# Patient Record
Sex: Male | Born: 1950
Health system: Southern US, Community
[De-identification: ages and names within clinical notes are randomized; demographics above are authoritative.]

## PROBLEM LIST (undated history)

## (undated) DIAGNOSIS — E785 Hyperlipidemia, unspecified: Secondary | ICD-10-CM

## (undated) DIAGNOSIS — R251 Tremor, unspecified: Secondary | ICD-10-CM

## (undated) DIAGNOSIS — D649 Anemia, unspecified: Secondary | ICD-10-CM

## (undated) DIAGNOSIS — K219 Gastro-esophageal reflux disease without esophagitis: Secondary | ICD-10-CM

## (undated) DIAGNOSIS — E119 Type 2 diabetes mellitus without complications: Secondary | ICD-10-CM

## (undated) DIAGNOSIS — M549 Dorsalgia, unspecified: Secondary | ICD-10-CM

## (undated) DIAGNOSIS — I10 Essential (primary) hypertension: Secondary | ICD-10-CM

## (undated) HISTORY — DX: Anemia, unspecified: D64.9

## (undated) HISTORY — PX: ELBOW BURSA SURGERY: SHX615

## (undated) HISTORY — DX: Hyperlipidemia, unspecified: E78.5

## (undated) HISTORY — PX: COLONOSCOPY: SHX174

## (undated) HISTORY — DX: Dorsalgia, unspecified: M54.9

## (undated) HISTORY — DX: Essential (primary) hypertension: I10

## (undated) HISTORY — DX: Type 2 diabetes mellitus without complications: E11.9

## (undated) HISTORY — DX: Tremor, unspecified: R25.1

## (undated) HISTORY — PX: HAND SURGERY: SHX662

## (undated) HISTORY — DX: Gastro-esophageal reflux disease without esophagitis: K21.9

---

## 2002-07-02 LAB — HM COLONOSCOPY

## 2008-06-01 ENCOUNTER — Ambulatory Visit: Payer: Self-pay | Admitting: Internal Medicine

## 2008-06-01 DIAGNOSIS — I10 Essential (primary) hypertension: Secondary | ICD-10-CM

## 2008-06-01 DIAGNOSIS — E785 Hyperlipidemia, unspecified: Secondary | ICD-10-CM

## 2008-06-01 DIAGNOSIS — K219 Gastro-esophageal reflux disease without esophagitis: Secondary | ICD-10-CM | POA: Insufficient documentation

## 2008-06-01 DIAGNOSIS — E1129 Type 2 diabetes mellitus with other diabetic kidney complication: Secondary | ICD-10-CM | POA: Insufficient documentation

## 2008-06-01 DIAGNOSIS — E119 Type 2 diabetes mellitus without complications: Secondary | ICD-10-CM

## 2008-06-01 HISTORY — DX: Hyperlipidemia, unspecified: E78.5

## 2008-06-01 HISTORY — DX: Type 2 diabetes mellitus without complications: E11.9

## 2008-06-01 HISTORY — DX: Gastro-esophageal reflux disease without esophagitis: K21.9

## 2008-06-01 HISTORY — DX: Essential (primary) hypertension: I10

## 2008-06-01 LAB — CONVERTED CEMR LAB
Blood Glucose, Fingerstick: 157
Hgb A1c MFr Bld: 6 % (ref 4.6–6.0)

## 2008-11-24 ENCOUNTER — Ambulatory Visit: Payer: Self-pay | Admitting: Internal Medicine

## 2008-11-24 LAB — CONVERTED CEMR LAB
ALT: 25 units/L (ref 0–53)
AST: 28 units/L (ref 0–37)
Albumin: 4.3 g/dL (ref 3.5–5.2)
Alkaline Phosphatase: 51 units/L (ref 39–117)
BUN: 18 mg/dL (ref 6–23)
Basophils Absolute: 0 10*3/uL (ref 0.0–0.1)
Basophils Relative: 0.2 % (ref 0.0–3.0)
Bilirubin Urine: NEGATIVE
Bilirubin, Direct: 0.1 mg/dL (ref 0.0–0.3)
Blood in Urine, dipstick: NEGATIVE
CO2: 29 meq/L (ref 19–32)
Calcium: 9.5 mg/dL (ref 8.4–10.5)
Chloride: 110 meq/L (ref 96–112)
Cholesterol: 149 mg/dL (ref 0–200)
Creatinine, Ser: 1.6 mg/dL — ABNORMAL HIGH (ref 0.4–1.5)
Creatinine,U: 254.3 mg/dL
Eosinophils Absolute: 0.2 10*3/uL (ref 0.0–0.7)
Eosinophils Relative: 4.1 % (ref 0.0–5.0)
GFR calc non Af Amer: 47.85 mL/min (ref >60)
Glucose, Bld: 108 mg/dL — ABNORMAL HIGH (ref 70–99)
Glucose, Urine, Semiquant: NEGATIVE
HCT: 37.1 % — ABNORMAL LOW (ref 39.0–52.0)
HDL: 41.6 mg/dL (ref >39.00)
Hemoglobin: 12.8 g/dL — ABNORMAL LOW (ref 13.0–17.0)
Hgb A1c MFr Bld: 6 % (ref 4.6–6.5)
LDL Cholesterol: 86 mg/dL (ref 0–99)
Lymphocytes Relative: 29 % (ref 12.0–46.0)
Lymphs Abs: 1.7 10*3/uL (ref 0.7–4.0)
MCHC: 34.6 g/dL (ref 30.0–36.0)
MCV: 101.9 fL — ABNORMAL HIGH (ref 78.0–100.0)
Microalb Creat Ratio: 14.2 mg/g (ref 0.0–30.0)
Microalb, Ur: 3.6 mg/dL — ABNORMAL HIGH (ref 0.0–1.9)
Monocytes Absolute: 0.5 10*3/uL (ref 0.1–1.0)
Monocytes Relative: 8.7 % (ref 3.0–12.0)
Neutro Abs: 3.5 10*3/uL (ref 1.4–7.7)
Neutrophils Relative %: 58 % (ref 43.0–77.0)
Nitrite: NEGATIVE
PSA: 0.73 ng/mL (ref 0.10–4.00)
Platelets: 199 10*3/uL (ref 150.0–400.0)
Potassium: 4.8 meq/L (ref 3.5–5.1)
RBC: 3.64 M/uL — ABNORMAL LOW (ref 4.22–5.81)
RDW: 11.8 % (ref 11.5–14.6)
Sodium: 142 meq/L (ref 135–145)
Specific Gravity, Urine: 1.02
TSH: 1.25 microintl units/mL (ref 0.35–5.50)
Total Bilirubin: 0.8 mg/dL (ref 0.3–1.2)
Total CHOL/HDL Ratio: 4
Total Protein: 7 g/dL (ref 6.0–8.3)
Triglycerides: 106 mg/dL (ref 0.0–149.0)
Urobilinogen, UA: 1
VLDL: 21.2 mg/dL (ref 0.0–40.0)
WBC Urine, dipstick: NEGATIVE
WBC: 5.9 10*3/uL (ref 4.5–10.5)
pH: 5.5

## 2008-12-06 ENCOUNTER — Ambulatory Visit: Payer: Self-pay | Admitting: Internal Medicine

## 2008-12-06 DIAGNOSIS — D649 Anemia, unspecified: Secondary | ICD-10-CM | POA: Insufficient documentation

## 2008-12-06 HISTORY — DX: Anemia, unspecified: D64.9

## 2009-06-09 ENCOUNTER — Ambulatory Visit: Payer: Self-pay | Admitting: Internal Medicine

## 2009-06-09 LAB — CONVERTED CEMR LAB
Blood Glucose, Fingerstick: 181
Hgb A1c MFr Bld: 5.5 % (ref 4.6–6.5)

## 2009-11-30 ENCOUNTER — Ambulatory Visit: Payer: Self-pay | Admitting: Internal Medicine

## 2009-11-30 LAB — CONVERTED CEMR LAB
ALT: 19 units/L (ref 0–53)
AST: 22 units/L (ref 0–37)
Albumin: 4.1 g/dL (ref 3.5–5.2)
Alkaline Phosphatase: 62 units/L (ref 39–117)
BUN: 19 mg/dL (ref 6–23)
Basophils Absolute: 0 10*3/uL (ref 0.0–0.1)
Basophils Relative: 0.6 % (ref 0.0–3.0)
Bilirubin Urine: NEGATIVE
Bilirubin, Direct: 0.1 mg/dL (ref 0.0–0.3)
Blood in Urine, dipstick: NEGATIVE
CO2: 26 meq/L (ref 19–32)
Calcium: 9.1 mg/dL (ref 8.4–10.5)
Chloride: 106 meq/L (ref 96–112)
Cholesterol: 155 mg/dL (ref 0–200)
Creatinine, Ser: 1.2 mg/dL (ref 0.4–1.5)
Creatinine,U: 183.2 mg/dL
Eosinophils Absolute: 0.3 10*3/uL (ref 0.0–0.7)
Eosinophils Relative: 4.4 % (ref 0.0–5.0)
GFR calc non Af Amer: 69.8 mL/min (ref >60)
Glucose, Bld: 89 mg/dL (ref 70–99)
Glucose, Urine, Semiquant: NEGATIVE
HCT: 39.1 % (ref 39.0–52.0)
HDL: 44.8 mg/dL (ref >39.00)
Hemoglobin: 13.6 g/dL (ref 13.0–17.0)
Hgb A1c MFr Bld: 5.8 % (ref 4.6–6.5)
Ketones, urine, test strip: NEGATIVE
LDL Cholesterol: 84 mg/dL (ref 0–99)
Lymphocytes Relative: 27.5 % (ref 12.0–46.0)
Lymphs Abs: 1.7 10*3/uL (ref 0.7–4.0)
MCHC: 34.9 g/dL (ref 30.0–36.0)
MCV: 102.6 fL — ABNORMAL HIGH (ref 78.0–100.0)
Microalb Creat Ratio: 1.5 mg/g (ref 0.0–30.0)
Microalb, Ur: 2.8 mg/dL — ABNORMAL HIGH (ref 0.0–1.9)
Monocytes Absolute: 0.5 10*3/uL (ref 0.1–1.0)
Monocytes Relative: 7.9 % (ref 3.0–12.0)
Neutro Abs: 3.7 10*3/uL (ref 1.4–7.7)
Neutrophils Relative %: 59.6 % (ref 43.0–77.0)
Nitrite: NEGATIVE
PSA: 1.29 ng/mL (ref 0.10–4.00)
Platelets: 208 10*3/uL (ref 150.0–400.0)
Potassium: 4.3 meq/L (ref 3.5–5.1)
RBC: 3.81 M/uL — ABNORMAL LOW (ref 4.22–5.81)
RDW: 12.4 % (ref 11.5–14.6)
Sodium: 141 meq/L (ref 135–145)
Specific Gravity, Urine: 1.025
TSH: 1.33 microintl units/mL (ref 0.35–5.50)
Total Bilirubin: 0.6 mg/dL (ref 0.3–1.2)
Total CHOL/HDL Ratio: 3
Total Protein: 7 g/dL (ref 6.0–8.3)
Triglycerides: 130 mg/dL (ref 0.0–149.0)
Urobilinogen, UA: 0.2
VLDL: 26 mg/dL (ref 0.0–40.0)
WBC Urine, dipstick: NEGATIVE
WBC: 6.3 10*3/uL (ref 4.5–10.5)
pH: 5.5

## 2009-12-12 ENCOUNTER — Ambulatory Visit: Payer: Self-pay | Admitting: Internal Medicine

## 2009-12-12 LAB — HM DIABETES EYE EXAM: HM Diabetic Eye Exam: NORMAL

## 2009-12-12 LAB — HM DIABETES FOOT EXAM

## 2009-12-12 LAB — CONVERTED CEMR LAB: Blood Glucose, Fingerstick: 123

## 2010-02-20 ENCOUNTER — Ambulatory Visit: Payer: Self-pay | Admitting: Internal Medicine

## 2010-02-20 DIAGNOSIS — M549 Dorsalgia, unspecified: Secondary | ICD-10-CM | POA: Insufficient documentation

## 2010-02-20 HISTORY — DX: Dorsalgia, unspecified: M54.9

## 2010-02-20 LAB — CONVERTED CEMR LAB: Blood Glucose, Fingerstick: 126

## 2010-04-11 ENCOUNTER — Ambulatory Visit: Payer: Self-pay | Admitting: Internal Medicine

## 2010-04-11 LAB — CONVERTED CEMR LAB
Blood Glucose, Fingerstick: 105
Hgb A1c MFr Bld: 5.5 % (ref 4.6–6.5)

## 2010-04-14 ENCOUNTER — Encounter: Admission: RE | Admit: 2010-04-14 | Discharge: 2010-04-14 | Payer: Self-pay | Admitting: Internal Medicine

## 2010-04-18 ENCOUNTER — Telehealth: Payer: Self-pay | Admitting: Internal Medicine

## 2010-04-19 ENCOUNTER — Telehealth: Payer: Self-pay

## 2010-04-20 ENCOUNTER — Encounter: Payer: Self-pay | Admitting: Internal Medicine

## 2010-06-15 ENCOUNTER — Ambulatory Visit: Payer: Self-pay | Admitting: Internal Medicine

## 2010-06-15 LAB — CONVERTED CEMR LAB: Blood Glucose, Fingerstick: 136

## 2010-08-01 NOTE — Assessment & Plan Note (Signed)
Summary: BACK PAIN//SLM   Vital Signs:  Richard [name] profile:   60 year old male Weight:      190 pounds Temp:     98.3 degrees F oral BP sitting:   110 / 68  (right arm) Cuff size:   regular  Vitals Entered By: Duard Brady LPN (April 11, 2010 8:10 AM) CC: c/o back pain getting worse , now neck bothering too ,tremors worse Is Richard [name] Diabetic? Yes Did you bring your meter with you today? No CBG Result 105   CC:  c/o back pain getting worse , now neck bothering too , and tremors worse.  History of Present Illness:  Richard Richard [name] who presents with a chief complaint of worsening low back pain.  This has been present for at least two months and he states that he is unable to work due to the severity of the pain.  Pain is aggravated by walking, with radiation to the hips and down both legs.  Denies any leg weakness.  He does have hypertension, type 2 diabetes, and dyslipidemia, which has been stable.  He is no history of radiographs of the back.  He also describes occasional orthostatic dizziness.  He is maintained nice glycemic control  Allergies (verified): No Known Drug Allergies  Past History:  Past Medical History: Reviewed history from 02/20/2010 and no changes required. Diabetes mellitus, type II GERD Hyperlipidemia Hypertension Anemia-NOS microalbuminuria low back pain  Past Surgical History: Reviewed history from 06/01/2008 and no changes required. colonoscopy 2004  Review of Systems       The Richard [name] complains of difficulty walking.  The Richard [name] denies anorexia, fever, weight loss, weight gain, vision loss, decreased hearing, hoarseness, chest pain, syncope, dyspnea on exertion, peripheral edema, prolonged cough, headaches, hemoptysis, abdominal pain, melena, hematochezia, severe indigestion/heartburn, hematuria, incontinence, genital sores, muscle weakness, suspicious skin lesions, transient blindness, depression, unusual weight change, abnormal bleeding,  enlarged lymph nodes, angioedema, breast masses, and testicular masses.    Physical Exam  General:  Well-developed,well-nourished,in no acute distress; alert,appropriate and cooperative throughout examination Head:  Normocephalic and atraumatic without obvious abnormalities. No apparent alopecia or balding. Mouth:  Oral mucosa and oropharynx without lesions or exudates.  Teeth in good repair. Neck:  No deformities, masses, or tenderness noted. Lungs:  Normal respiratory effort, chest expands symmetrically. Lungs are clear to auscultation, no crackles or wheezes. Heart:  Normal rate and regular rhythm. S1 and S2 normal without gallop, murmur, click, rub or other extra sounds. Abdomen:  Bowel sounds positive,abdomen soft and non-tender without masses, organomegaly or hernias noted. Msk:  diminished range of motion of both hips; straight leg testing negative Pulses:  R and L carotid,radial,femoral,dorsalis pedis and posterior tibial pulses are full and equal bilaterally Extremities:  No clubbing, cyanosis, edema, or deformity noted with normal full range of motion of all joints.     Impression & Recommendations:  Problem # 1:  BACK PAIN (ICD-724.5)  His updated medication list for this problem includes:    Cyclobenzaprine Hcl 10 Mg Tabs (Cyclobenzaprine hcl) ..... One tablet 3 times daily    Tramadol Hcl 50 Mg Tabs (Tramadol hcl) ..... One every 6 hours as needed for pain    His updated medication list for this problem includes:    Cyclobenzaprine Hcl 10 Mg Tabs (Cyclobenzaprine hcl) ..... One tablet 3 times daily    Tramadol Hcl 50 Mg Tabs (Tramadol hcl) ..... One every 6 hours as needed for pain  Orders: Radiology Referral (Radiology)  Problem # 2:  HYPERTENSION (ICD-401.9)  The following medications were removed from the medication list:    Lisinopril 40 Mg Tabs (Lisinopril) .Marland Kitchen... 1 once daily His updated medication list for this problem includes:    Lisinopril 20 Mg Tabs  (Lisinopril) ..... One daily  The following medications were removed from the medication list:    Lisinopril 40 Mg Tabs (Lisinopril) .Marland Kitchen... 1 once daily His updated medication list for this problem includes:    Lisinopril 20 Mg Tabs (Lisinopril) ..... One daily  Problem # 3:  DIABETES MELLITUS, TYPE II (ICD-250.00)  The following medications were removed from the medication list:    Lisinopril 40 Mg Tabs (Lisinopril) .Marland Kitchen... 1 once daily His updated medication list for this problem includes:    Metformin Hcl 500 Mg Xr24h-tab (Metformin hcl) .Marland KitchenMarland KitchenMarland KitchenMarland Kitchen 3 once daily    Lisinopril 20 Mg Tabs (Lisinopril) ..... One daily  Orders: Capillary Blood Glucose/CBG ([address]) Venipuncture ([address]) TLB-A1C / Hgb A1C (Glycohemoglobin) (83036-A1C) Specimen Handling (09811)  The following medications were removed from the medication list:    Lisinopril 40 Mg Tabs (Lisinopril) .Marland Kitchen... 1 once daily His updated medication list for this problem includes:    Metformin Hcl 500 Mg Xr24h-tab (Metformin hcl) .Marland KitchenMarland KitchenMarland KitchenMarland Kitchen 3 once daily    Lisinopril 20 Mg Tabs (Lisinopril) ..... One daily  Complete Medication List: 1)  [facility]vs Omeprazole 20 Mg Tbec (Omeprazole) .Marland Kitchen.. 1 once daily 2)  Metformin Hcl 500 Mg Xr24h-tab (Metformin hcl) .... 3 once daily 3)  Simvastatin 80 Mg Tabs (Simvastatin) .Marland Kitchen.. 1 once daily 4)  Freestyle Lancets Misc (Lancets) .... Use daily 5)  Accu-chek Aviva Strp (Glucose blood) .... Once daily and prn 6)  Cyclobenzaprine Hcl 10 Mg Tabs (Cyclobenzaprine hcl) .... One tablet 3 times daily 7)  Tramadol Hcl 50 Mg Tabs (Tramadol hcl) .... One every 6 hours as needed for pain 8)  Lisinopril 20 Mg Tabs (Lisinopril) .... One daily  Richard [name] Instructions: 1)  Please schedule a follow-up appointment in 3 months. 2)  Limit your Sodium (Salt). 3)  It is important that you exercise regularly at least 20 minutes 5 times a week. If you develop chest pain, have severe difficulty breathing, or feel very tired , stop exercising  immediately and seek medical attention. 4)  You need to lose weight. Consider a lower calorie diet and regular exercise.  5)  Check your blood sugars regularly. If your readings are usually above : or below Richard you should contact our office. 6)  It is important that your Diabetic A1c level is checked every 3 months. 7)  See your eye doctor yearly to check for diabetic eye damage.

## 2010-08-01 NOTE — Consult Note (Signed)
Summary: [facility]  [facility]   Imported By: Lanelle Bal 05/12/2010 08:18:08  _____________________________________________________________________  External Attachment:    Type:   Image     Comment:   External Document

## 2010-08-01 NOTE — Assessment & Plan Note (Signed)
Summary: back pain/njr   Vital Signs:  Patient profile:   60 year old male Weight:      198 pounds Temp:     98.0 degrees F BP sitting:   110 / 78  (left arm) Cuff size:   regular  Vitals Entered By: Kathrynn Speed CMA (February 20, 2010 4:39 PM) CC: back pain worse in the last 2 weeks , pain in hips when walkin, left pinky is numb, for one week, src  Is Patient Diabetic? Yes CBG Result 126   CC:  back pain worse in the last 2 weeks , pain in hips when walkin, left pinky is numb, for one week, and src .  History of Present Illness: 60 year old patient, who presents with a two to 3-week history of back pain.  He has had intermittent difficulties in the past.  He has been using ibuprofen as much as 7 tablets at a single dose.  He also describes an occasional tingling involving his left fifth finger area.  He states he also wakes the morning occasionally with some bilateral hand and arm numbness.  He has treated hypertension, dyslipidemia, and type 2 diabetes.  He states that the back pain is all aggravated by walking, with radiation to the hip and legs.   Current Medications (verified): 1)  [facility]vs Omeprazole 20 Mg Tbec (Omeprazole) .Marland Kitchen.. 1 Once Daily 2)  Metformin Hcl 500 Mg Xr24h-Tab (Metformin Hcl) .... 3 Once Daily 3)  Lisinopril 40 Mg Tabs (Lisinopril) .Marland Kitchen.. 1 Once Daily 4)  Simvastatin 80 Mg Tabs (Simvastatin) .Marland Kitchen.. 1 Once Daily 5)  Freestyle Lancets  Misc (Lancets) .... Use Daily 6)  Accu-Chek Aviva  Strp (Glucose Blood) .... Once Daily and Prn  Allergies (verified): No Known Drug Allergies  Past History:  Past Medical History: Diabetes mellitus, type II GERD Hyperlipidemia Hypertension Anemia-NOS microalbuminuria low back pain  Review of Systems  The patient denies anorexia, fever, weight loss, weight gain, vision loss, decreased hearing, hoarseness, chest pain, syncope, dyspnea on exertion, peripheral edema, prolonged cough, headaches, hemoptysis, abdominal pain, melena,  hematochezia, severe indigestion/heartburn, hematuria, incontinence, genital sores, muscle weakness, suspicious skin lesions, transient blindness, difficulty walking, depression, unusual weight change, abnormal bleeding, enlarged lymph nodes, angioedema, breast masses, and testicular masses.    Physical Exam  General:  Well-developed,well-nourished,in no acute distress; alert,appropriate and cooperative throughout examination Head:  Normocephalic and atraumatic without obvious abnormalities. No apparent alopecia or balding. Mouth:  Oral mucosa and oropharynx without lesions or exudates.  Teeth in good repair. Neck:  No deformities, masses, or tenderness noted. Lungs:  Normal respiratory effort, chest expands symmetrically. Lungs are clear to auscultation, no crackles or wheezes. Heart:  Normal rate and regular rhythm. S1 and S2 normal without gallop, murmur, click, rub or other extra sounds. Abdomen:  Bowel sounds positive,abdomen soft and non-tender without masses, organomegaly or hernias noted.   Impression & Recommendations:  Problem # 1:  BACK PAIN (ICD-724.5)  His updated medication list for this problem includes:    Cyclobenzaprine Hcl 10 Mg Tabs (Cyclobenzaprine hcl) ..... One tablet 3 times daily    Tramadol Hcl 50 Mg Tabs (Tramadol hcl) ..... One every 6 hours as needed for pain the patient will be treated with ibuprofen 800 mg t.i.d. with meals, and clinically observed.  He will be treated symptomatically.  If he fails to improve, will consider for imaging studies to rule out spinal stenosis and neurogenic claudication  Problem # 2:  HYPERTENSION (ICD-401.9)  His updated medication list for this  problem includes:    Lisinopril 40 Mg Tabs (Lisinopril) .Marland Kitchen... 1 once daily  Problem # 3:  DIABETES MELLITUS, TYPE II (ICD-250.00)  His updated medication list for this problem includes:    Metformin Hcl 500 Mg Xr24h-tab (Metformin hcl) .Marland KitchenMarland KitchenMarland KitchenMarland Kitchen 3 once daily    Lisinopril 40 Mg Tabs  (Lisinopril) .Marland Kitchen... 1 once daily  Orders: Capillary Blood Glucose/CBG (32440)  Complete Medication List: 1)  Cvs Omeprazole 20 Mg Tbec (Omeprazole) .Marland Kitchen.. 1 once daily 2)  Metformin Hcl 500 Mg Xr24h-tab (Metformin hcl) .... 3 once daily 3)  Lisinopril 40 Mg Tabs (Lisinopril) .Marland Kitchen.. 1 once daily 4)  Simvastatin 80 Mg Tabs (Simvastatin) .Marland Kitchen.. 1 once daily 5)  Freestyle Lancets Misc (Lancets) .... Use daily 6)  Accu-chek Aviva Strp (Glucose blood) .... Once daily and prn 7)  Cyclobenzaprine Hcl 10 Mg Tabs (Cyclobenzaprine hcl) .... One tablet 3 times daily 8)  Tramadol Hcl 50 Mg Tabs (Tramadol hcl) .... One every 6 hours as needed for pain  Patient Instructions: 1)  Most patients (90%) with low back pain will improve with time (2-6 weeks). Keep active but avoid activities that are painful. Apply moist heat and/or ice to lower back several times a day. 2)  Please schedule a follow-up appointment in 3 months. Prescriptions: TRAMADOL HCL 50 MG TABS (TRAMADOL HCL) one every 6 hours as needed for pain  #50 x 4   Entered and Authorized by:   Gordy Savers  MD   Signed by:   Gordy Savers  MD on 02/20/2010   Method used:   [facility]        [facility] [address] [phone]* (retail)       [address]       [place], [place]  [address]       Ph: [phone] or [phone]       Fax: [phone]   RxID:   [id number] CYCLOBENZAPRINE HCL 10 MG TABS (CYCLOBENZAPRINE HCL) one tablet 3 times daily  #30 x 4   Entered and Authorized by:   Gordy Savers  MD   Signed by:   Gordy Savers  MD on 02/20/2010   Method used:   [facility]        [facility] [address] [phone]* (retail)       [address]       [place], [place]  [address]       Ph: [phone] or [phone]       Fax: [phone]   RxID:   [id number]   Appended Document: back pain/njr     Vitals Entered By: Duard Brady LPN (February 21, 2010  7:59 AM)  Allergies: No Known Drug Allergies   Complete Medication List: 1)  [facility]vs Omeprazole 20 Mg Tbec (Omeprazole) .Marland Kitchen.. 1 once daily 2)  Metformin Hcl 500 Mg Xr24h-tab (Metformin hcl) .... 3 once daily 3)  Lisinopril 40 Mg Tabs (Lisinopril) .Marland Kitchen.. 1 once daily 4)  Simvastatin 80 Mg Tabs (Simvastatin) .Marland Kitchen.. 1 once daily 5)  Freestyle Lancets Misc (Lancets) .... Use daily 6)  Accu-chek Aviva Strp (Glucose blood) .... Once daily and prn 7)  Cyclobenzaprine Hcl 10 Mg Tabs (Cyclobenzaprine hcl) .... One tablet 3 times daily 8)  Tramadol Hcl 50 Mg Tabs (Tramadol hcl) .... One every 6 hours as needed for pain  Other Orders: Depo- Medrol 80mg  (J1040) Admin of Therapeutic Inj  intramuscular  or subcutaneous ([address])    Medication Administration  Injection # 1:    Medication: Depo- Medrol 80mg     Diagnosis: BACK PAIN (ICD-724.5)    Route: IM    Site: LUOQ gluteus    Exp Date: 09/2012    Lot #: [id number]    Mfr: Pharmacia    Patient tolerated injection without complications    Given by: Duard Brady LPN (February 21, 2010 8:00 AM)  Orders Added: 1)  Depo- Medrol 80mg  [J1040] 2)  Admin of Therapeutic Inj  intramuscular or subcutaneous [60454]

## 2010-08-01 NOTE — Miscellaneous (Signed)
Summary: rx different glucose test strips  Clinical Lists Changes  Medications: Removed medication of FREESTYLE LITE TEST  STRP (GLUCOSE BLOOD) use daily Added new medication of ACCU-CHEK AVIVA  STRP (GLUCOSE BLOOD) once daily and prn - Signed Rx of ACCU-CHEK AVIVA  STRP (GLUCOSE BLOOD) once daily and prn;  #30 days x 6;  Signed;  Entered by: Duard Brady LPN;  Authorized by: Gordy Savers  MD;  Method used: [facility] [address] [phone]*, [address], [place], [place], [place]  [address], Ph: [phone] or [phone], Fax: [phone]    Prescriptions: ACCU-CHEK AVIVA  STRP (GLUCOSE BLOOD) once daily and prn  #30 days x 6   Entered by:   Duard Brady LPN   Authorized by:   Gordy Savers  MD   Signed by:   Duard Brady LPN on 04/28/2535   Method used:   [facility]        [facility] [address] [phone]* (retail)       [address]       [place], [place]  [address]       Ph: [phone] or [phone]       Fax: [phone]   RxID:   [id number]  rec'd message from [facility] test strips not covered by ins. Ordered accucheck aviva test strips - called pt , if needs accuchek meter , come and get one. KIK

## 2010-08-01 NOTE — Progress Notes (Signed)
Summary: clarify prednisone sig  Phone Note Call from Patient Call back at [phone]   Caller: Patient--live call Summary of Call: pt was prescribed prednisone. he is requesting specific directions. Initial call taken by: Warnell Forester,  April 19, 2010 9:30 AM  Follow-up for Phone Call        spoke with pt - did not rec'v instructions how to take prednisone - did not recv 'pak'   i called pharmacy - some insurance does not pay for pak - have pt call and they will give instructions.   called pt to inform. KIk Follow-up by: Duard Brady LPN,  April 19, 2010 11:55 AM

## 2010-08-01 NOTE — Letter (Signed)
Summary: Out of Work  Adult nurse at [employer]  [address]   [place], [place] [address]   Phone: [phone]  Fax: [phone]    April 11, 2010   Employee:  MATHEWS STUHR [facility]    To Whom It May Concern:   For Medical reasons, please excuse the above named employee from work for the following dates:  Start:   04-11-10  End:   04-18-10  If you need additional information, please feel free to contact our office.         Sincerely,    Gordy Savers  MD

## 2010-08-01 NOTE — Progress Notes (Signed)
Summary: Pt called to get results from MRI. Pls call asap today  Phone Note Call from Patient Call back at [facility] Phone [phone] Call back at [phone] cell  (try cell # first, then call home#)   Caller: Patient Summary of Call: Pt called and is req results from MRI.Pt is not feeling any better and is suppose to be going back to work tomorrow and needs to know if he can or not. Pls call asap today.  Initial call taken by: Lucy Antigua,  April 18, 2010 11:10 AM    New/Updated Medications: PREDNISONE (PAK) 5 MG TABS (PREDNISONE) as directed Prescriptions: PREDNISONE (PAK) 5 MG TABS (PREDNISONE) as directed  #1 x 0   Entered and Authorized by:   Gordy Savers  MD   Signed by:   Gordy Savers  MD on 04/18/2010   Method used:   [facility]        [facility] [address] [phone]* (retail)       [address]       [place], [place]  [address]       Ph: [phone] or [phone]       Fax: [phone]   RxID:   [id number]

## 2010-08-01 NOTE — Assessment & Plan Note (Signed)
Summary: CPX // RS   Vital Signs:  Patient profile:   60 year old [name] Weight:      201 pounds Temp:     98.0 degrees F oral BP sitting:   130 / 80  (left arm) Cuff size:   regular  Vitals Entered By: Duard Brady LPN (December 12, 2009 1:14 PM) CC: cpx - doing well , BS running around 105 daily  Is Patient Diabetic? Yes Did you bring your meter with you today? No CBG Result 123   CC:  cpx - doing well  and BS running around 105 daily .  History of Present Illness: 60 year old patient who is seen today for a wellness exam.  Medical problems include type 2 diabetes dyslipidemia and hypertension.  He is doing quite well and denies any cardiopulmonary complaints and will A1c was normal.  He does monitor blood sugars at home with nice readings.  Preventive Screening-Counseling & Management  Alcohol-Tobacco     Smoking Status: current  Allergies (verified): No Known Drug Allergies  Past History:  Past Medical History: Reviewed history from 12/06/2008 and no changes required. Diabetes mellitus, type II GERD Hyperlipidemia Hypertension Anemia-NOS microalbuminuria  Past Surgical History: Reviewed history from 06/01/2008 and no changes required. colonoscopy 2004  Family History: Reviewed history from 06/01/2008 and no changes required. father died of suicide death at age 43 history of early dementia mother died age 3.  History of chronic artery disease, status post prior MI, history of chronic kidney disease, status post pacemaker insertion  One brother one sister positive for diabetes  Social History: Reviewed history from 12/06/2008 and no changes required. 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    Physical Exam  General:  Well-developed,well-nourished,in no acute distress; alert,appropriate and cooperative throughout examination Head:  Normocephalic and atraumatic without obvious abnormalities. No apparent alopecia or balding. Eyes:  No corneal or conjunctival inflammation noted. EOMI. Perrla. Funduscopic exam benign, without hemorrhages, exudates or papilledema. Vision grossly normal. Ears:  External ear exam shows no significant lesions or deformities.  Otoscopic examination reveals clear canals, tympanic membranes are intact bilaterally without bulging, retraction, inflammation or discharge. Hearing is grossly normal bilaterally. Nose:  External nasal examination shows no deformity or inflammation. Nasal mucosa are pink and moist without lesions or exudates. Mouth:  Oral mucosa and oropharynx without lesions or exudates.  Teeth in good repair. Neck:  No deformities, masses, or tenderness noted. Chest Wall:  No deformities, masses, tenderness or gynecomastia noted. Breasts:  No masses or gynecomastia noted Lungs:  Normal respiratory effort, chest expands symmetrically. Lungs are clear to auscultation, no crackles or wheezes. Heart:  Normal rate and regular rhythm. S1 and S2 normal without gallop, murmur, click, rub or other extra sounds. Abdomen:  Bowel sounds positive,abdomen soft and non-tender without masses, organomegaly or hernias noted.  liver edge may be palpable on inspiration Rectal:  No external abnormalities noted. Normal sphincter tone. No rectal masses or tenderness. Genitalia:  Testes bilaterally descended without  nodularity, tenderness or masses. No scrotal masses or lesions.  No penis lesions or urethral discharge. Prostate:  2+ enlarged.  2+ enlarged.   Msk:  No deformity or scoliosis noted of thoracic or lumbar spine.   Pulses:  R and L carotid,radial,femoral,dorsalis pedis and posterior tibial pulses are full and equal bilaterally Extremities:  No clubbing, cyanosis, edema, or deformity noted with normal full range of motion of all joints.   Neurologic:  No cranial nerve deficits noted. Station and gait are normal. Plantar reflexes are down-going bilaterally. DTRs are symmetrical throughout. Sensory, motor and coordinative functions appear intact. Skin:  Intact without suspicious lesions or rashes Cervical Nodes:  No lymphadenopathy noted Axillary Nodes:  No palpable lymphadenopathy Inguinal Nodes:  No significant adenopathy Psych:  Cognition and judgment appear intact. Alert and cooperative with normal attention span and concentration. No apparent delusions, illusions, hallucinations  Diabetes Management Exam:    Foot Exam (with socks and/or shoes not present):       Sensory-Pinprick/Light touch:          Left medial foot (L-4): normal          Left dorsal foot (L-5): normal          Left lateral foot (S-1): normal          Right medial foot (L-4): normal          Right dorsal foot (L-5): normal          Right lateral foot (S-1): normal       Sensory-Monofilament:          Left foot: normal          Right foot: normal       Inspection:          Left foot: normal          Right foot: normal       Nails:          Left foot: thickened          Right foot: thickened    Foot Exam by Podiatrist:       Date: 12/12/2009       Results: no diabetic findings       Done by: PCP    Eye Exam:       Eye Exam done here today          Results: normal   Impression & Recommendations:  Problem # 1:  PHYSICAL EXAMINATION (ICD-V70.0)  Complete Medication List: 1)  [facility] Omeprazole 20 Mg Tbec  (Omeprazole) .Marland Kitchen.. 1 once daily 2)  Metformin Hcl 500 Mg Xr24h-tab (Metformin hcl) .... 3 once daily 3)  Lisinopril 40 Mg Tabs (Lisinopril) .Marland Kitchen.. 1 once daily 4)  Simvastatin 80 Mg Tabs (Simvastatin) .Marland Kitchen.. 1 once daily 5)  Freestyle Lite Test Strp (Glucose blood) .... Use daily 6)  Freestyle Lancets Misc (Lancets) .... Use daily  Other Orders: Capillary Blood Glucose/CBG (13086)  Patient Instructions: 1)  Please schedule a follow-up appointment in 6 months. 2)  Limit your Sodium (Salt). 3)  It is important that you exercise regularly at least 20 minutes 5 times a week. If you develop chest pain, have severe difficulty breathing, or feel very tired , stop exercising immediately and seek medical attention. 4)  You need to lose weight. Consider a lower calorie diet and regular exercise.  5)  Check your blood sugars regularly. If your readings are usually above : or below 70 you should contact our office. 6)  It is important that your Diabetic A1c level is checked  every 3 months. 7)  See your eye doctor yearly to check for diabetic eye damage. Prescriptions: FREESTYLE LANCETS  MISC (LANCETS) use daily  #90 x 6   Entered and Authorized by:   Gordy Savers  MD   Signed by:   Gordy Savers  MD on 12/12/2009   Method used:   [facility]        [facility] [address] [phone]* (retail)       [address]       [place], [place]  [address]       Ph: [phone] or [phone]       Fax: [phone]   RxID:   [id number] SIMVASTATIN 80 MG TABS (SIMVASTATIN) 1 once daily  #90 Tablet x 6   Entered and Authorized by:   Gordy Savers  MD   Signed by:   Gordy Savers  MD on 12/12/2009   Method used:   [facility]        [facility] [address] [phone]* (retail)       [address]       [place], [place]  [address]       Ph: [phone] or [phone]       Fax: [phone]   RxID:   [id number] FREESTYLE LITE  TEST  STRP (GLUCOSE BLOOD) use daily  #90 x 6   Entered and Authorized by:   Gordy Savers  MD   Signed by:   Gordy Savers  MD on 12/12/2009   Method used:   [facility]        [facility] [address] [phone]* (retail)       [address]       [place], [place]  [address]       Ph: [phone] or [phone]       Fax: [phone]   RxID:   [id number] LISINOPRIL 40 MG TABS (LISINOPRIL) 1 once daily  #90 Tablet x 6   Entered and Authorized by:   Gordy Savers  MD   Signed by:   Gordy Savers  MD on 12/12/2009   Method used:   [facility]        [facility] [address] [phone]* (retail)       [address]       [place], [place]  [address]       Ph: [phone] or [phone]       Fax: [phone]   RxID:   [id number] METFORMIN HCL 500 MG XR24H-TAB (METFORMIN HCL) 3 once daily  #270 Tablet x 6   Entered and Authorized by:   Gordy Savers  MD   Signed by:   Gordy Savers  MD on 12/12/2009   Method used:   [facility]        [facility] [address] [phone]* (retail)       [address]       [place], [place]  [address]       Ph: [phone] or [phone]       Fax: [phone]   RxID:   [id number] [facility] OMEPRAZOLE 20 MG TBEC (OMEPRAZOLE) 1 once daily  #90 Capsule x 6   Entered and Authorized by:  Gordy Savers  MD   Signed by:   Gordy Savers  MD on 12/12/2009   Method used:   [facility] to        [facility] [address] [phone]* (retail)       [address]       [place], [place]  [address]       Ph: [phone] or [phone]       Fax: [phone]   RxID:   [id number]

## 2010-08-03 NOTE — Assessment & Plan Note (Signed)
Summary: 6 month follow up/cjr   Vital Signs:  Patient profile:   60 year old male Weight:      198 pounds Temp:     98.8 degrees F oral BP sitting:   130 / 80  (right arm) Cuff size:   regular  Vitals Entered By: Duard Brady LPN (June 15, 2010 3:47 PM) CC: 6 mos rov - nasal congestion  Is Patient Diabetic? Yes Did you bring your meter with you today? No CBG Result 136   CC:  6 mos rov - nasal congestion .  History of Present Illness: 60 year old patient who is seen today for follow-up of his type 2 diabetes.  He has hypertension, gastroesophageal reflux disease, and the osteoarthritis.  He has done quite well.  His last hemoglobin A1c was 5.5.  He remains on metformin therapy alone.  He is on simvastatin 80 mg daily, which he continues to tolerate well.  He has gastroesophageal reflux disease, which has been stable  Allergies (verified): No Known Drug Allergies  Past History:  Past Medical History: Reviewed history from 02/20/2010 and no changes required. Diabetes mellitus, type II GERD Hyperlipidemia Hypertension Anemia-NOS microalbuminuria low back pain  Past Surgical History: Reviewed history from 06/01/2008 and no changes required. colonoscopy 2004  Family History: Reviewed history from 06/01/2008 and no changes required. father died of suicide death at age 89 history of early dementia mother died age 41.  History of chronic artery disease, status post prior MI, history of chronic kidney disease, status post pacemaker insertion  One brother one sister positive for diabetes  Review of Systems  The patient denies anorexia, fever, weight loss, weight gain, vision loss, decreased hearing, hoarseness, chest pain, syncope, dyspnea on exertion, peripheral edema, prolonged cough, headaches, hemoptysis, abdominal pain, melena, hematochezia, severe indigestion/heartburn, hematuria, incontinence, genital sores, muscle weakness, suspicious skin lesions,  transient blindness, difficulty walking, depression, unusual weight change, abnormal bleeding, enlarged lymph nodes, angioedema, breast masses, and testicular masses.    Physical Exam  General:  overweight-appearing.  130/80 Head:  Normocephalic and atraumatic without obvious abnormalities. No apparent alopecia or balding. Eyes:  No corneal or conjunctival inflammation noted. EOMI. Perrla. Funduscopic exam benign, without hemorrhages, exudates or papilledema. Vision grossly normal. Mouth:  Oral mucosa and oropharynx without lesions or exudates.  Teeth in good repair. Neck:  No deformities, masses, or tenderness noted. Lungs:  Normal respiratory effort, chest expands symmetrically. Lungs are clear to auscultation, no crackles or wheezes. Heart:  Normal rate and regular rhythm. S1 and S2 normal without gallop, murmur, click, rub or other extra sounds. Abdomen:  Bowel sounds positive,abdomen soft and non-tender without masses, organomegaly or hernias noted. Msk:  No deformity or scoliosis noted of thoracic or lumbar spine.   Pulses:  R and L carotid,radial,femoral,dorsalis pedis and posterior tibial pulses are full and equal bilaterally Extremities:  No clubbing, cyanosis, edema, or deformity noted with normal full range of motion of all joints.   Skin:  Intact without suspicious lesions or rashes Cervical Nodes:  No lymphadenopathy noted Axillary Nodes:  No palpable lymphadenopathy   Impression & Recommendations:  Problem # 1:  BACK PAIN (ICD-724.5)  His updated medication list for this problem includes:    Cyclobenzaprine Hcl 10 Mg Tabs (Cyclobenzaprine hcl) ..... One tablet 3 times daily    Tramadol Hcl 50 Mg Tabs (Tramadol hcl) ..... One every 6 hours as needed for pain  Problem # 2:  HYPERTENSION (ICD-401.9)  His updated medication list for this problem includes:  Lisinopril 20 Mg Tabs (Lisinopril) ..... One daily  Problem # 3:  HYPERLIPIDEMIA (ICD-272.4)  His updated  medication list for this problem includes:    Simvastatin 80 Mg Tabs (Simvastatin) .Marland Kitchen... 1 once daily  Problem # 4:  DIABETES MELLITUS, TYPE II (ICD-250.00)  His updated medication list for this problem includes:    Metformin Hcl 500 Mg Xr24h-tab (Metformin hcl) .Marland KitchenMarland KitchenMarland KitchenMarland Kitchen 3 once daily    Lisinopril 20 Mg Tabs (Lisinopril) ..... One daily  Orders: Capillary Blood Glucose/CBG (24401)  Complete Medication List: 1)  [facility] Omeprazole 20 Mg Tbec (Omeprazole) .Marland Kitchen.. 1 once daily 2)  Metformin Hcl 500 Mg Xr24h-tab (Metformin hcl) .... 3 once daily 3)  Simvastatin 80 Mg Tabs (Simvastatin) .Marland Kitchen.. 1 once daily 4)  Accu-chek Aviva Strp (Glucose blood) .... Once daily and prn 5)  Cyclobenzaprine Hcl 10 Mg Tabs (Cyclobenzaprine hcl) .... One tablet 3 times daily 6)  Tramadol Hcl 50 Mg Tabs (Tramadol hcl) .... One every 6 hours as needed for pain 7)  Lisinopril 20 Mg Tabs (Lisinopril) .... One daily 8)  Prednisone (pak) 5 Mg Tabs (Prednisone) .... As directed 9)  Accu-chek Aviva Kit (Blood glucose monitoring suppl) 10)  Accu-chek Multiclix Lancets Misc (Lancets) .... Once daily and prn  Patient Instructions: 1)  Please schedule a follow-up appointment in 6 months for annual exam 2)  Limit your Sodium (Salt) to less than 2 grams a day(slightly less than 1/2 a teaspoon) to prevent fluid retention, swelling, or worsening of symptoms. 3)  It is important that you exercise regularly at least 20 minutes 5 times a week. If you develop chest pain, have severe difficulty breathing, or feel very tired , stop exercising immediately and seek medical attention. 4)  You need to lose weight. Consider a lower calorie diet and regular exercise.  5)  Check your blood sugars regularly. If your readings are usually above : or below 70 you should contact our office. 6)  It is important that your Diabetic A1c level is checked every 3 months. 7)  See your eye doctor yearly to check for diabetic eye  damage. Prescriptions: SIMVASTATIN 80 MG TABS (SIMVASTATIN) 1 once daily  #90 Tablet x 6   Entered and Authorized by:   Gordy Savers  MD   Signed by:   Gordy Savers  MD on 06/15/2010   Method used:   [facility] to        [facility] [address] [phone]* (retail)       [address]       [place], [place]  [address]       Ph: [phone] or [phone]       Fax: [phone]   RxID:   [id number] METFORMIN HCL 500 MG XR24H-TAB (METFORMIN HCL) 3 once daily  #270 Tablet x 6   Entered and Authorized by:   Gordy Savers  MD   Signed by:   Gordy Savers  MD on 06/15/2010   Method used:   [facility]ron[facility] to        [facility] [address] [phone]* (retail)       [address]       [place], [place]  [address]       Ph: [phone] or [phone]       Fax: [phone]   RxID:   [id number] [facility] OMEPRAZOLE 20 MG TBEC (OMEPRAZOLE) 1 once daily  #90  Capsule x 6   Entered and Authorized by:   Gordy Savers  MD   Signed by:   Gordy Savers  MD on 06/15/2010   Method used:   Electronically to        [facility] [address] [phone]* (retail)       [address]       [place], [place]  [address]       Ph: [phone] or [phone]       Fax: [phone]   RxID:   [id number] ACCU-CHEK AVIVA  STRP (GLUCOSE BLOOD) once daily and prn  #100 x 3   Entered by:   Duard Brady LPN   Authorized by:   Gordy Savers  MD   Signed by:   Duard Brady LPN on 34/74/2595   Method used:   [facility] to        [facility] [address] [phone]* (retail)       [address]       [place], [place]  [address]       Ph: [phone] or [phone]       Fax: [phone]   RxID:   [id number] ACCU-CHEK MULTICLIX LANCETS  MISC (LANCETS) once daily and prn  #100 x 3   Entered by:   Duard Brady LPN   Authorized by:   Gordy Savers  MD   Signed  by:   Duard Brady LPN on 17/61/6073   Method used:   [facility]ronically to        [facility] [address] [phone]* (retail)       [address]       [place], [place]  [address]       Ph: [phone] or [phone]       Fax: [phone]   RxID:   [id number]    Orders Added: 1)  Capillary Blood Glucose/CBG [82948] 2)  Est. Patient Level IV [23536]

## 2010-12-14 ENCOUNTER — Other Ambulatory Visit: Payer: Self-pay

## 2010-12-21 ENCOUNTER — Encounter: Payer: Self-pay | Admitting: Internal Medicine

## 2010-12-22 ENCOUNTER — Other Ambulatory Visit (INDEPENDENT_AMBULATORY_CARE_PROVIDER_SITE_OTHER): Payer: 59

## 2010-12-22 DIAGNOSIS — Z0000 Encounter for general adult medical examination without abnormal findings: Secondary | ICD-10-CM

## 2010-12-22 DIAGNOSIS — T887XXA Unspecified adverse effect of drug or medicament, initial encounter: Secondary | ICD-10-CM

## 2010-12-22 DIAGNOSIS — E785 Hyperlipidemia, unspecified: Secondary | ICD-10-CM

## 2010-12-22 LAB — HEPATIC FUNCTION PANEL
ALT: 15 U/L (ref 0–53)
AST: 17 U/L (ref 0–37)
Albumin: 4.3 g/dL (ref 3.5–5.2)
Alkaline Phosphatase: 79 U/L (ref 39–117)
Bilirubin, Direct: 0.1 mg/dL (ref 0.0–0.3)
Total Protein: 6.8 g/dL (ref 6.0–8.3)

## 2010-12-22 LAB — POCT URINALYSIS DIPSTICK
Glucose, UA: NEGATIVE
Leukocytes, UA: NEGATIVE
Nitrite, UA: NEGATIVE
Spec Grav, UA: 1.03
Urobilinogen, UA: 1
pH, UA: 5.5

## 2010-12-22 LAB — HEMOGLOBIN A1C: Hgb A1c MFr Bld: 5.6 % (ref 4.6–6.5)

## 2010-12-22 LAB — CBC WITH DIFFERENTIAL/PLATELET
Basophils Absolute: 0 10*3/uL (ref 0.0–0.1)
Basophils Relative: 0.6 % (ref 0.0–3.0)
Eosinophils Absolute: 0.5 10*3/uL (ref 0.0–0.7)
Eosinophils Relative: 6.5 % — ABNORMAL HIGH (ref 0.0–5.0)
HCT: 38.8 % — ABNORMAL LOW (ref 39.0–52.0)
Hemoglobin: 13.2 g/dL (ref 13.0–17.0)
Lymphocytes Relative: 22 % (ref 12.0–46.0)
Lymphs Abs: 1.6 10*3/uL (ref 0.7–4.0)
MCHC: 34 g/dL (ref 30.0–36.0)
MCV: 107.8 fl — ABNORMAL HIGH (ref 78.0–100.0)
Monocytes Absolute: 0.6 10*3/uL (ref 0.1–1.0)
Neutro Abs: 4.6 10*3/uL (ref 1.4–7.7)
Neutrophils Relative %: 62.2 % (ref 43.0–77.0)
RDW: 13.2 % (ref 11.5–14.6)
WBC: 7.3 10*3/uL (ref 4.5–10.5)

## 2010-12-22 LAB — LIPID PANEL
Cholesterol: 158 mg/dL (ref 0–200)
HDL: 39.2 mg/dL (ref >39.00)
LDL Cholesterol: 95 mg/dL (ref 0–99)
Total CHOL/HDL Ratio: 4
Triglycerides: 118 mg/dL (ref 0.0–149.0)
VLDL: 23.6 mg/dL (ref 0.0–40.0)

## 2010-12-22 LAB — BASIC METABOLIC PANEL
BUN: 17 mg/dL (ref 6–23)
CO2: 26 mEq/L (ref 19–32)
Calcium: 9.8 mg/dL (ref 8.4–10.5)
Chloride: 107 mEq/L (ref 96–112)
Creatinine, Ser: 1.4 mg/dL (ref 0.4–1.5)
Glucose, Bld: 90 mg/dL (ref 70–99)
Sodium: 140 mEq/L (ref 135–145)

## 2010-12-22 LAB — MICROALBUMIN / CREATININE URINE RATIO
Creatinine,U: 397.6 mg/dL
Microalb Creat Ratio: 1.3 mg/g (ref 0.0–30.0)
Microalb, Ur: 5.2 mg/dL — ABNORMAL HIGH (ref 0.0–1.9)

## 2010-12-22 LAB — PSA: PSA: 1.19 ng/mL (ref 0.10–4.00)

## 2010-12-28 ENCOUNTER — Encounter: Payer: Self-pay | Admitting: Internal Medicine

## 2010-12-29 ENCOUNTER — Ambulatory Visit (INDEPENDENT_AMBULATORY_CARE_PROVIDER_SITE_OTHER): Payer: 59 | Admitting: Internal Medicine

## 2010-12-29 ENCOUNTER — Encounter: Payer: Self-pay | Admitting: Internal Medicine

## 2010-12-29 DIAGNOSIS — E119 Type 2 diabetes mellitus without complications: Secondary | ICD-10-CM

## 2010-12-29 DIAGNOSIS — I10 Essential (primary) hypertension: Secondary | ICD-10-CM

## 2010-12-29 DIAGNOSIS — E785 Hyperlipidemia, unspecified: Secondary | ICD-10-CM

## 2010-12-29 DIAGNOSIS — M25519 Pain in unspecified shoulder: Secondary | ICD-10-CM

## 2010-12-29 DIAGNOSIS — Z0000 Encounter for general adult medical examination without abnormal findings: Secondary | ICD-10-CM

## 2010-12-29 MED ORDER — TRAMADOL HCL 50 MG PO TABS: 50.0000 mg | ORAL_TABLET | Freq: Four times a day (QID) | ORAL | Status: DC | PRN

## 2010-12-29 MED ORDER — SIMVASTATIN 80 MG PO TABS: 80.0000 mg | ORAL_TABLET | Freq: Every day | ORAL | Status: DC

## 2010-12-29 MED ORDER — OMEPRAZOLE 20 MG PO CPDR: 20.0000 mg | DELAYED_RELEASE_CAPSULE | Freq: Every day | ORAL | Status: DC

## 2010-12-29 MED ORDER — METFORMIN HCL 500 MG PO TABS: ORAL_TABLET | ORAL | Status: DC

## 2010-12-29 MED ORDER — LISINOPRIL 20 MG PO TABS: 20.0000 mg | ORAL_TABLET | Freq: Every day | ORAL | Status: DC

## 2010-12-29 NOTE — Patient Instructions (Signed)
Cardiac stress test as discussed  Limit your sodium (Salt) intake  Return in 3 months for follow-up

## 2010-12-29 NOTE — Progress Notes (Signed)
Subjective:    Patient ID: Richard [name], male    DOB: January 16, 1951, 60 y.o.   MRN: [id number]  HPI  60 year old patient who is in today for a wellness exam he has multiple cardiac risk factors including hypertension dyslipidemia and type 2 diabetes. He has maintained nice glycemic control he continues to smoke 2 or 3 cigars nightly but discontinued cigarette use about 15 years ago. For the past 2 months he has had exertional neck and shoulder pain.  Preventive Screening-Counseling & Management  Alcohol-Tobacco  Smoking Status: current   Allergies (verified):  No Known Drug Allergies   Past History:  Past Medical History:  Reviewed history from 12/06/2008 and no changes required.  Diabetes mellitus, type II  GERD  Hyperlipidemia  Hypertension  Anemia-NOS  microalbuminuria   Past Surgical History:  Reviewed history from 06/01/2008 and no changes required.  colonoscopy 2004   Family History:  Reviewed history from 06/01/2008 and no changes required.  father died of suicide death at age 15 history of early dementia  mother died age 61. History of chronic artery disease, status post prior MI, history of chronic kidney disease, status post pacemaker insertion  One brother one sister positive for diabetes - sister with advanced senile dementia of the Alzheimer's type  Social History:  Reviewed history from 12/06/2008 and no changes required.  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  HENT: Negative for hearing loss, ear pain, congestion, rhinorrhea, sneezing, mouth sores, trouble swallowing, neck pain, neck stiffness, dental problem, voice change, sinus pressure and tinnitus.   Eyes: Negative for photophobia, pain, redness and visual disturbance.  Respiratory:  Negative for apnea, cough, choking, chest tightness, shortness of breath and wheezing.   Cardiovascular: Negative for chest pain, palpitations and leg swelling.       Exertional neck and shoulder pain may be angina equivalent  Gastrointestinal: Negative for nausea, vomiting, abdominal pain, diarrhea, constipation, blood in stool, abdominal distention, anal bleeding and rectal pain.  Genitourinary: Negative for dysuria, urgency, frequency, hematuria, flank pain, decreased urine volume, discharge, penile swelling, scrotal swelling, difficulty urinating, genital sores and testicular pain.  Musculoskeletal: Negative for myalgias, back pain, joint swelling, arthralgias and gait problem.  Skin: Negative for color change, rash and wound.  Neurological: Negative for dizziness, tremors, seizures, syncope, facial asymmetry, speech difficulty, weakness, light-headedness, numbness and headaches.  Hematological: Negative for adenopathy. Does not bruise/bleed easily.  Psychiatric/Behavioral: Negative for suicidal ideas, hallucinations, behavioral problems, confusion, sleep disturbance, self-injury, dysphoric mood, decreased concentration and agitation. The patient is not nervous/anxious.        Objective:   Physical Exam  Constitutional: He appears well-developed and well-nourished.  HENT:  Head: Normocephalic and atraumatic.  Right Ear: External ear normal.  Left Ear: External ear normal.  Nose: Nose normal.  Mouth/Throat: Oropharynx is clear and moist.  Eyes: Conjunctivae and EOM are normal. Pupils are equal, round, and reactive to light. No scleral icterus.  Neck: Normal range of motion. Neck supple. No JVD present. No thyromegaly present.  Cardiovascular: Regular rhythm, normal heart sounds and intact distal pulses.  Exam reveals no gallop and no friction rub.   No murmur heard. Pulmonary/Chest: Effort normal and breath sounds normal. He exhibits no tenderness.  Abdominal: Soft. Bowel sounds are  normal. He exhibits no  distension and no mass. There is no tenderness.  Genitourinary: Prostate normal and penis normal.  Musculoskeletal: Normal range of motion. He exhibits no edema and no tenderness.  Lymphadenopathy:    He has no cervical adenopathy.  Neurological: He is alert. He has normal reflexes. No cranial nerve deficit. Coordination normal.  Skin: Skin is warm and dry. No rash noted.  Psychiatric: He has a normal mood and affect. His behavior is normal.          Assessment & Plan:    Annual health examination. Patient has exertional neck and shoulder discomfort. He has multiple cardiovascular risk factors we'll review a 12-lead EKG and set up for a nuclear stress test

## 2011-04-23 LAB — HM DIABETES EYE EXAM

## 2011-04-24 ENCOUNTER — Encounter: Payer: Self-pay | Admitting: Internal Medicine

## 2011-07-02 ENCOUNTER — Other Ambulatory Visit: Payer: Self-pay | Admitting: Internal Medicine

## 2011-07-04 ENCOUNTER — Other Ambulatory Visit: Payer: Self-pay | Admitting: Internal Medicine

## 2011-07-04 MED ORDER — SIMVASTATIN 80 MG PO TABS: 80.0000 mg | ORAL_TABLET | Freq: Every day | ORAL | Status: DC

## 2011-07-04 NOTE — Telephone Encounter (Signed)
[facility] cpx is in June.

## 2011-08-20 ENCOUNTER — Ambulatory Visit (INDEPENDENT_AMBULATORY_CARE_PROVIDER_SITE_OTHER): Payer: 59 | Admitting: Internal Medicine

## 2011-08-20 ENCOUNTER — Other Ambulatory Visit: Payer: Self-pay | Admitting: Internal Medicine

## 2011-08-20 ENCOUNTER — Encounter: Payer: Self-pay | Admitting: Internal Medicine

## 2011-08-20 DIAGNOSIS — E119 Type 2 diabetes mellitus without complications: Secondary | ICD-10-CM

## 2011-08-20 DIAGNOSIS — I10 Essential (primary) hypertension: Secondary | ICD-10-CM

## 2011-08-20 LAB — GLUCOSE, POCT (MANUAL RESULT ENTRY): POC Glucose: 123

## 2011-08-20 NOTE — Patient Instructions (Signed)
Annual exam as scheduled in June  Call or return to clinic prn if these symptoms worsen or fail to improve as anticipated.

## 2011-08-20 NOTE — Progress Notes (Signed)
  Subjective:    Patient ID: Richard [name], male    DOB: 1950-11-24, 61 y.o.   MRN: [id number]  HPI  61 year old patient who has a history of type 2 diabetes controlled on metformin therapy alone. Her today he had an episode of dizziness associated with the nausea diaphoresis and weakness. He was concerned about the uncontrolled diabetes. His home glucometer was not functional. At the present time he feels well random blood sugar today 123 hemoglobin A1c's have been consistently less than 6 on metformin therapy he states that he has had similar episodes related to inner ear issues really over the years    Review of Systems  Constitutional: Negative for fever, chills, appetite change and fatigue.  HENT: Negative for hearing loss, ear pain, congestion, sore throat, trouble swallowing, neck stiffness, dental problem, voice change and tinnitus.   Eyes: Negative for pain, discharge and visual disturbance.  Respiratory: Negative for cough, chest tightness, wheezing and stridor.   Cardiovascular: Negative for chest pain, palpitations and leg swelling.  Gastrointestinal: Positive for nausea. Negative for vomiting, abdominal pain, diarrhea, constipation, blood in stool and abdominal distention.  Genitourinary: Negative for urgency, hematuria, flank pain, discharge, difficulty urinating and genital sores.  Musculoskeletal: Negative for myalgias, back pain, joint swelling, arthralgias and gait problem.  Skin: Negative for rash.  Neurological: Positive for weakness and light-headedness. Negative for dizziness, syncope, speech difficulty, numbness and headaches.  Hematological: Negative for adenopathy. Does not bruise/bleed easily.  Psychiatric/Behavioral: Negative for behavioral problems and dysphoric mood. The patient is not nervous/anxious.        Objective:   Physical Exam  Constitutional: He is oriented to person, place, and time. He appears well-developed.       Repeat blood pressure 130/90    HENT:  Head: Normocephalic.  Right Ear: External ear normal.  Left Ear: External ear normal.  Eyes: Conjunctivae and EOM are normal.  Neck: Normal range of motion.  Cardiovascular: Normal rate and normal heart sounds.   Pulmonary/Chest: Breath sounds normal.  Abdominal: Bowel sounds are normal.  Musculoskeletal: Normal range of motion. He exhibits no edema and no tenderness.  Neurological: He is alert and oriented to person, place, and time.  Psychiatric: He has a normal mood and affect. His behavior is normal.          Assessment & Plan:    Doubt hypoglycemic episode suspect more of a vertiginous episode Hypertension Diabetes mellitus.  Patient was given a new glucometer he'll check his blood sugars as needed. He is scheduled for a physical in 4 months we'll reassess at that time

## 2011-09-03 ENCOUNTER — Other Ambulatory Visit: Payer: Self-pay | Admitting: Internal Medicine

## 2011-11-14 ENCOUNTER — Other Ambulatory Visit: Payer: Self-pay | Admitting: Internal Medicine

## 2011-12-24 ENCOUNTER — Other Ambulatory Visit (INDEPENDENT_AMBULATORY_CARE_PROVIDER_SITE_OTHER): Payer: 59

## 2011-12-24 DIAGNOSIS — Z0000 Encounter for general adult medical examination without abnormal findings: Secondary | ICD-10-CM

## 2011-12-24 LAB — MICROALBUMIN / CREATININE URINE RATIO
Creatinine,U: 181.3 mg/dL
Microalb Creat Ratio: 4.1 mg/g (ref 0.0–30.0)

## 2011-12-24 LAB — HEPATIC FUNCTION PANEL
ALT: 13 U/L (ref 0–53)
Bilirubin, Direct: 0.2 mg/dL (ref 0.0–0.3)
Total Protein: 7.4 g/dL (ref 6.0–8.3)

## 2011-12-24 LAB — PSA: PSA: 1.11 ng/mL (ref 0.10–4.00)

## 2011-12-24 LAB — BASIC METABOLIC PANEL
Chloride: 103 mEq/L (ref 96–112)
Potassium: 5.4 mEq/L — ABNORMAL HIGH (ref 3.5–5.1)

## 2011-12-24 LAB — POCT URINALYSIS DIPSTICK
Ketones, UA: NEGATIVE
pH, UA: 5.5

## 2011-12-24 LAB — CBC WITH DIFFERENTIAL/PLATELET
Basophils Relative: 0.7 % (ref 0.0–3.0)
Eosinophils Relative: 4.1 % (ref 0.0–5.0)
HCT: 48.1 % (ref 39.0–52.0)
Lymphs Abs: 2.2 10*3/uL (ref 0.7–4.0)
MCV: 106.9 fl — ABNORMAL HIGH (ref 78.0–100.0)
Monocytes Absolute: 0.5 10*3/uL (ref 0.1–1.0)
RBC: 4.5 Mil/uL (ref 4.22–5.81)
WBC: 5.7 10*3/uL (ref 4.5–10.5)

## 2011-12-24 LAB — LIPID PANEL
Cholesterol: 125 mg/dL (ref 0–200)
LDL Cholesterol: 61 mg/dL (ref 0–99)

## 2011-12-24 LAB — TSH: TSH: 0.97 u[IU]/mL (ref 0.35–5.50)

## 2011-12-31 ENCOUNTER — Ambulatory Visit (INDEPENDENT_AMBULATORY_CARE_PROVIDER_SITE_OTHER): Payer: 59 | Admitting: Internal Medicine

## 2011-12-31 ENCOUNTER — Encounter: Payer: Self-pay | Admitting: Internal Medicine

## 2011-12-31 VITALS — BP 140/90 | HR 92 | Temp 98.3°F | Resp 20 | Ht 67.5 in | Wt 181.0 lb

## 2011-12-31 DIAGNOSIS — Z0000 Encounter for general adult medical examination without abnormal findings: Secondary | ICD-10-CM

## 2011-12-31 DIAGNOSIS — F17200 Nicotine dependence, unspecified, uncomplicated: Secondary | ICD-10-CM

## 2011-12-31 DIAGNOSIS — D7589 Other specified diseases of blood and blood-forming organs: Secondary | ICD-10-CM | POA: Insufficient documentation

## 2011-12-31 DIAGNOSIS — Z720 Tobacco use: Secondary | ICD-10-CM

## 2011-12-31 MED ORDER — LISINOPRIL 20 MG PO TABS: 20.0000 mg | ORAL_TABLET | Freq: Every day | ORAL | Status: DC

## 2011-12-31 MED ORDER — OMEPRAZOLE 20 MG PO CPDR: 20.0000 mg | DELAYED_RELEASE_CAPSULE | Freq: Every day | ORAL | Status: DC

## 2011-12-31 MED ORDER — SIMVASTATIN 40 MG PO TABS: ORAL_TABLET | ORAL | Status: DC

## 2011-12-31 MED ORDER — METFORMIN HCL 500 MG PO TABS: ORAL_TABLET | ORAL | Status: DC

## 2011-12-31 MED ORDER — TRAMADOL HCL 50 MG PO TABS: 50.0000 mg | ORAL_TABLET | Freq: Three times a day (TID) | ORAL | Status: DC | PRN

## 2011-12-31 MED ORDER — TERBINAFINE HCL 250 MG PO TABS: 250.0000 mg | ORAL_TABLET | Freq: Every day | ORAL | Status: DC

## 2011-12-31 NOTE — Patient Instructions (Signed)
Limit your sodium (Salt) intake  Smoking tobacco is very bad for your health. You should stop smoking immediately.  Return in 6 months for follow-up   

## 2011-12-31 NOTE — Progress Notes (Signed)
Subjective:    Patient ID: Richard [name], male    DOB: [birth year], 61 y.o.   MRN: [id number]  Diabetes Pertinent negatives for hypoglycemia include no confusion, dizziness, headaches, nervousness/anxiousness, seizures, speech difficulty or tremors. Pertinent negatives for diabetes include no chest pain, no fatigue and no weakness.  Hypertension Pertinent negatives include no chest pain, headaches, neck pain, palpitations or shortness of breath.  Hyperlipidemia Pertinent negatives include no chest pain, myalgias or shortness of breath.  61 year-old patient who is in today for a wellness exam;  he has multiple cardiac risk factors including hypertension dyslipidemia and type 2 diabetes. He has maintained nice glycemic control he continues to smoke approximately one pack per day. At the time his last exam he was smoking cigars only for now has resumed smoking cigarettes. He has had no further exertional shoulder and neck pain but continues to have some and shoulder discomfort with movement   Preventive Screening-Counseling & Management  Alcohol-Tobacco  Smoking Status: current   Allergies (verified):  No Known Drug Allergies   Past History:  Past Medical History:  Reviewed history from 12/06/2008 and no changes required.  Diabetes mellitus, type II  GERD  Hyperlipidemia  Hypertension  Anemia-NOS  microalbuminuria  Macrocytosis Tobacco abuse  Past Surgical History:  Reviewed history from 06/01/2008 and no changes required.  colonoscopy 2004   Family History:  Reviewed history from 06/01/2008 and no changes required.   father died of suicide death at age 8 history of early dementia  mother died age 37. History of chronic artery disease, status post prior MI, history of chronic kidney disease, status post pacemaker insertion  One brother one sister positive for diabetes - sister with advanced senile dementia of the Alzheimer's type  Social History:  Reviewed history from  12/06/2008 and no changes required.  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  HENT: Negative for hearing loss, ear pain, congestion, rhinorrhea, sneezing, mouth sores, trouble swallowing, neck pain, neck stiffness, dental problem, voice change, sinus pressure and tinnitus.   Eyes: Negative for photophobia, pain, redness and visual disturbance.  Respiratory: Negative for apnea, cough, choking, chest tightness, shortness of breath and wheezing.   Cardiovascular: Negative for chest pain, palpitations and leg swelling.  Gastrointestinal: Negative for nausea, vomiting, abdominal pain, diarrhea, constipation, blood in stool, abdominal distention, anal bleeding and rectal pain.  Genitourinary: Negative for dysuria, urgency, frequency, hematuria, flank pain, decreased urine volume, discharge, penile swelling, scrotal swelling, difficulty urinating, genital sores and testicular pain.  Musculoskeletal: Positive for arthralgias. Negative for myalgias, back pain, joint swelling and gait problem.       Bilateral shoulder and some pain  Skin: Negative for color change, rash and wound.  Neurological: Negative for dizziness, tremors, seizures, syncope, facial asymmetry, speech difficulty, weakness, light-headedness, numbness and headaches.  Hematological: Negative for adenopathy. Does not bruise/bleed easily.  Psychiatric/Behavioral: Negative for suicidal ideas, hallucinations, behavioral problems, confusion, disturbed wake/sleep cycle, self-injury, dysphoric mood, decreased concentration and agitation. The patient is not nervous/anxious.        Objective:   Physical Exam  Constitutional: He appears well-developed and well-nourished.  HENT:  Head: Normocephalic and atraumatic.  Right Ear:  External ear normal.  Left Ear: External ear normal.  Nose: Nose normal.  Mouth/Throat: Oropharynx is clear and  moist.  Eyes: Conjunctivae and EOM are normal. Pupils are equal, round, and reactive to light. No scleral icterus.  Neck: Normal range of motion. Neck supple. No JVD present. No thyromegaly present.  Cardiovascular: Regular rhythm, normal heart sounds and intact distal pulses.  Exam reveals no gallop and no friction rub.   No murmur heard.      Dorsalis pedis pulses faint. Posterior tibial pulses full  Pulmonary/Chest: Effort normal and breath sounds normal. He exhibits no tenderness.  Abdominal: Soft. Bowel sounds are normal. He exhibits no distension and no mass. There is no tenderness.  Genitourinary: Prostate normal and penis normal.  Musculoskeletal: Normal range of motion. He exhibits no edema and no tenderness.  Lymphadenopathy:    He has no cervical adenopathy.  Neurological: He is alert. He has normal reflexes. No cranial nerve deficit. Coordination normal.  Skin: Skin is warm and dry. No rash noted.       Onychomycotic nail changes  Psychiatric: He has a normal mood and affect. His behavior is normal.          Assessment & Plan:    Annual health examination.   dyslipidemia. Total cholesterol 125. We'll decrease simvastatin to 40 mg daily Diabetes stable  Hypertension stable  Tobacco abuse. Total smoking cessation encouraged  Toenail onychomycosis. We'll treat with Lamisil for 12 weeks

## 2012-01-28 ENCOUNTER — Other Ambulatory Visit: Payer: Self-pay | Admitting: Internal Medicine

## 2012-03-20 ENCOUNTER — Encounter: Payer: Self-pay | Admitting: Gastroenterology

## 2012-08-05 ENCOUNTER — Other Ambulatory Visit: Payer: Self-pay | Admitting: Internal Medicine

## 2012-09-09 ENCOUNTER — Encounter: Payer: Self-pay | Admitting: Internal Medicine

## 2012-09-09 ENCOUNTER — Ambulatory Visit (INDEPENDENT_AMBULATORY_CARE_PROVIDER_SITE_OTHER): Payer: 59 | Admitting: Internal Medicine

## 2012-09-09 VITALS — BP 140/80 | HR 84 | Temp 98.1°F | Resp 20 | Wt 186.0 lb

## 2012-09-09 DIAGNOSIS — I10 Essential (primary) hypertension: Secondary | ICD-10-CM

## 2012-09-09 DIAGNOSIS — E119 Type 2 diabetes mellitus without complications: Secondary | ICD-10-CM

## 2012-09-09 DIAGNOSIS — F17200 Nicotine dependence, unspecified, uncomplicated: Secondary | ICD-10-CM

## 2012-09-09 DIAGNOSIS — Z720 Tobacco use: Secondary | ICD-10-CM

## 2012-09-09 MED ORDER — PREDNISONE 20 MG PO TABS: 20.0000 mg | ORAL_TABLET | Freq: Two times a day (BID) | ORAL | Status: DC

## 2012-09-09 MED ORDER — FLUTICASONE PROPIONATE 50 MCG/ACT NA SUSP: 2.0000 | Freq: Every day | NASAL | Status: DC

## 2012-09-09 NOTE — Progress Notes (Signed)
Subjective:    Patient ID: Richard [name], male    DOB: July 03, 1950, 62 y.o.   MRN: [id number]  HPI  62 year old patient with a history of ongoing tobacco use. He presents with a 4 to five-day history of sinus congestion malaise and a general sense of unwellness. No fever. He has type 2 diabetes which has maintained excellent control. He is scheduled for followup exam and physical later this summer. No purulent drainage or focal sinus pain. No dental pain  Past Medical History  Diagnosis Date  . ANEMIA-NOS 12/06/2008  . BACK PAIN 02/20/2010  . DIABETES MELLITUS, TYPE II 06/01/2008  . GERD 06/01/2008  . HYPERLIPIDEMIA 06/01/2008  . HYPERTENSION 06/01/2008    History   Social History  . Marital Status: Married    Spouse Name: N/A    Number of Children: N/A  . Years of Education: N/A   Occupational History  . Not on file.   Social History Main Topics  . Smoking status: Current Every Day Smoker    Types: Cigarettes  . Smokeless tobacco: Never Used  . Alcohol Use: Yes  . Drug Use: No  . Sexually Active: Not on file   Other Topics Concern  . Not on file   Social History Narrative  . No narrative on file    History reviewed. No pertinent past surgical history.  Family History  Problem Relation Age of Onset  . Diabetes Sister   . Diabetes Brother     No Known Allergies  Current Outpatient Prescriptions on File Prior to Visit  Medication Sig Dispense Refill  . ACCU-CHEK AVIVA test strip USE ONCE DAILY AND AS NEEDED  100 each  3  . cyclobenzaprine (FLEXERIL) 10 MG tablet Take 10 mg by mouth 3 (three) times daily as needed.        . Lancets (ACCU-CHEK MULTICLIX) lancets 1 each by Other route daily. Use as instructed       . lisinopril (PRINIVIL,ZESTRIL) 20 MG tablet Take 1 tablet (20 mg total) by mouth daily.  90 tablet  6  . metFORMIN (GLUCOPHAGE) 500 MG tablet TAKE 2 TABLETS IN AM AND 1 TABLET IN PM  270 tablet  5  . omeprazole (PRILOSEC) 20 MG capsule Take 1 capsule (20 mg  total) by mouth daily.  90 capsule  6  . simvastatin (ZOCOR) 40 MG tablet TAKE 1 TABLET EVERY DAY  90 tablet  1  . terbinafine (LAMISIL) 250 MG tablet Take 1 tablet (250 mg total) by mouth daily.  90 tablet  0  . traMADol (ULTRAM) 50 MG tablet Take 1 tablet (50 mg total) by mouth every 8 (eight) hours as needed for pain.  90 tablet  4   No current facility-administered medications on file prior to visit.    BP 140/80  Pulse 84  Temp(Src) 98.1 F (36.7 C) (Oral)  Resp 20  Wt 186 lb (84.369 kg)  BMI 28.68 kg/m2  SpO2 98%      Review of Systems  Constitutional: Positive for fatigue. Negative for fever, chills and appetite change.  HENT: Positive for congestion, rhinorrhea and sinus pressure. Negative for hearing loss, ear pain, sore throat, trouble swallowing, neck stiffness, dental problem, voice change and tinnitus.   Eyes: Negative for pain, discharge and visual disturbance.  Respiratory: Negative for cough, chest tightness, wheezing and stridor.   Cardiovascular: Negative for chest pain, palpitations and leg swelling.  Gastrointestinal: Negative for nausea, vomiting, abdominal pain, diarrhea, constipation, blood in stool and abdominal distention.  Genitourinary: Negative for urgency, hematuria, flank pain, discharge, difficulty urinating and genital sores.  Musculoskeletal: Negative for myalgias, back pain, joint swelling, arthralgias and gait problem.  Skin: Negative for rash.  Neurological: Negative for dizziness, syncope, speech difficulty, weakness, numbness and headaches.  Hematological: Negative for adenopathy. Does not bruise/bleed easily.  Psychiatric/Behavioral: Negative for behavioral problems and dysphoric mood. The patient is not nervous/anxious.        Objective:   Physical Exam  Constitutional: He is oriented to person, place, and time. He appears well-developed.  HENT:  Head: Normocephalic.  Right Ear: External ear normal.  Left Ear: External ear normal.   Eyes: Conjunctivae and EOM are normal.  Neck: Normal range of motion.  Cardiovascular: Normal rate and normal heart sounds.   Pulmonary/Chest: Breath sounds normal.  Abdominal: Bowel sounds are normal.  Musculoskeletal: Normal range of motion. He exhibits no edema and no tenderness.  Neurological: He is alert and oriented to person, place, and time.  Psychiatric: He has a normal mood and affect. His behavior is normal.          Assessment & Plan:   URI/Viral sinusitis  Symptomatic treatment

## 2012-09-09 NOTE — Patient Instructions (Signed)
Smoking tobacco is very bad for your health. You should stop smoking immediately.  Acute sinusitis symptoms for less than 10 days are generally not helped by antibiotic therapy.  Use saline irrigation, warm  moist compresses and over-the-counter decongestants only as directed.  Call if there is no improvement in 5 to 7 days, or sooner if you develop increasing pain, fever, or any new symptoms.

## 2012-09-23 ENCOUNTER — Ambulatory Visit (INDEPENDENT_AMBULATORY_CARE_PROVIDER_SITE_OTHER): Payer: 59 | Admitting: Internal Medicine

## 2012-09-23 ENCOUNTER — Encounter: Payer: Self-pay | Admitting: Internal Medicine

## 2012-09-23 VITALS — BP 160/90 | HR 98 | Temp 98.8°F | Resp 18 | Wt 187.0 lb

## 2012-09-23 DIAGNOSIS — F17200 Nicotine dependence, unspecified, uncomplicated: Secondary | ICD-10-CM

## 2012-09-23 DIAGNOSIS — Z720 Tobacco use: Secondary | ICD-10-CM

## 2012-09-23 MED ORDER — AMOXICILLIN-POT CLAVULANATE 875-125 MG PO TABS: 1.0000 | ORAL_TABLET | Freq: Two times a day (BID) | ORAL | Status: DC

## 2012-09-23 NOTE — Progress Notes (Signed)
Subjective:    Patient ID: Richard [name], male    DOB: 12-Apr-1951, 62 y.o.   MRN: [id number]  HPI  62 year old patient who was seen 2 weeks ago with a URI symptoms. He was treated symptomatically at that time. More recently has developed worsening sinus pain congestion and drainage. He feels unwell. He feels that he has had intermittent low-grade fever. He continues to smoke. Chronic medical problems include diabetes hypertension and dyslipidemia.  Past Medical History  Diagnosis Date  . ANEMIA-NOS 12/06/2008  . BACK PAIN 02/20/2010  . DIABETES MELLITUS, TYPE II 06/01/2008  . GERD 06/01/2008  . HYPERLIPIDEMIA 06/01/2008  . HYPERTENSION 06/01/2008    History   Social History  . Marital Status: Married    Spouse Name: N/A    Number of Children: N/A  . Years of Education: N/A   Occupational History  . Not on file.   Social History Main Topics  . Smoking status: Current Every Day Smoker    Types: Cigarettes  . Smokeless tobacco: Never Used  . Alcohol Use: Yes  . Drug Use: No  . Sexually Active: Not on file   Other Topics Concern  . Not on file   Social History Narrative  . No narrative on file    History reviewed. No pertinent past surgical history.  Family History  Problem Relation Age of Onset  . Diabetes Sister   . Diabetes Brother     No Known Allergies  Current Outpatient Prescriptions on File Prior to Visit  Medication Sig Dispense Refill  . ACCU-CHEK AVIVA test strip USE ONCE DAILY AND AS NEEDED  100 each  3  . cyclobenzaprine (FLEXERIL) 10 MG tablet Take 10 mg by mouth 3 (three) times daily as needed.        . fluticasone (FLONASE) 50 MCG/ACT nasal spray Place 2 sprays into the nose daily.  16 g  6  . Lancets (ACCU-CHEK MULTICLIX) lancets 1 each by Other route daily. Use as instructed       . lisinopril (PRINIVIL,ZESTRIL) 20 MG tablet Take 1 tablet (20 mg total) by mouth daily.  90 tablet  6  . metFORMIN (GLUCOPHAGE) 500 MG tablet TAKE 2 TABLETS IN AM AND 1  TABLET IN PM  270 tablet  5  . omeprazole (PRILOSEC) 20 MG capsule Take 1 capsule (20 mg total) by mouth daily.  90 capsule  6  . simvastatin (ZOCOR) 40 MG tablet TAKE 1 TABLET EVERY DAY  90 tablet  1  . traMADol (ULTRAM) 50 MG tablet Take 1 tablet (50 mg total) by mouth every 8 (eight) hours as needed for pain.  90 tablet  4   No current facility-administered medications on file prior to visit.    BP 160/90  Pulse 98  Temp(Src) 98.8 F (37.1 C) (Oral)  Resp 18  Wt 187 lb (84.823 kg)  BMI 28.84 kg/m2  SpO2 98%       Review of Systems  Constitutional: Positive for fever and fatigue. Negative for chills and appetite change.  HENT: Positive for congestion and postnasal drip. Negative for hearing loss, ear pain, sore throat, trouble swallowing, neck stiffness, dental problem, voice change and tinnitus.   Eyes: Negative for pain, discharge and visual disturbance.  Respiratory: Negative for cough, chest tightness, wheezing and stridor.   Cardiovascular: Negative for chest pain, palpitations and leg swelling.  Gastrointestinal: Negative for nausea, vomiting, abdominal pain, diarrhea, constipation, blood in stool and abdominal distention.  Genitourinary: Negative for urgency, hematuria, flank  pain, discharge, difficulty urinating and genital sores.  Musculoskeletal: Negative for myalgias, back pain, joint swelling, arthralgias and gait problem.  Skin: Negative for rash.  Neurological: Positive for headaches. Negative for dizziness, syncope, speech difficulty, weakness and numbness.  Hematological: Negative for adenopathy. Does not bruise/bleed easily.  Psychiatric/Behavioral: Negative for behavioral problems and dysphoric mood. The patient is not nervous/anxious.        Objective:   Physical Exam  Constitutional: He is oriented to person, place, and time. He appears well-developed.  HENT:  Head: Normocephalic.  Right Ear: External ear normal.  Left Ear: External ear normal.   Sinus tenderness to palpation  Eyes: Conjunctivae and EOM are normal.  Neck: Normal range of motion.  Cardiovascular: Normal rate and normal heart sounds.   Pulmonary/Chest: Breath sounds normal.  Abdominal: Bowel sounds are normal.  Musculoskeletal: Normal range of motion. He exhibits no edema and no tenderness.  Neurological: He is alert and oriented to person, place, and time.  Psychiatric: He has a normal mood and affect. His behavior is normal.          Assessment & Plan:   Sinusitis. Will treat with Augmentin. We'll continue expectorants decongestants nasal irrigation. Cessation of all tobacco encouraged Hypertension stable diabetes mellitus

## 2012-09-23 NOTE — Patient Instructions (Signed)
Use saline irrigation, warm  moist compresses and over-the-counter decongestants only as directed.  Call if there is no improvement in 5 to 7 days, or sooner if you develop increasing pain, fever, or any new symptoms. Take your antibiotic as prescribed until ALL of it is gone, but stop if you develop a rash, swelling, or any side effects of the medication.  Contact our office as soon as possible if  there are side effects of the medication.  Smoking tobacco is very bad for your health. You should stop smoking immediately.

## 2012-09-30 ENCOUNTER — Encounter: Payer: Self-pay | Admitting: Internal Medicine

## 2012-09-30 ENCOUNTER — Ambulatory Visit (INDEPENDENT_AMBULATORY_CARE_PROVIDER_SITE_OTHER): Payer: 59 | Admitting: Internal Medicine

## 2012-09-30 VITALS — BP 150/90 | HR 86 | Temp 98.3°F | Resp 18 | Wt 188.0 lb

## 2012-09-30 DIAGNOSIS — I10 Essential (primary) hypertension: Secondary | ICD-10-CM

## 2012-09-30 DIAGNOSIS — M7021 Olecranon bursitis, right elbow: Secondary | ICD-10-CM

## 2012-09-30 DIAGNOSIS — E119 Type 2 diabetes mellitus without complications: Secondary | ICD-10-CM

## 2012-09-30 DIAGNOSIS — M7020 Olecranon bursitis, unspecified elbow: Secondary | ICD-10-CM

## 2012-09-30 NOTE — Progress Notes (Signed)
Subjective:    Patient ID: Richard [name], male    DOB: 15-Sep-1950, 62 y.o.   MRN: [id number]  HPI  62 year old patient who presents with a one-day history of pain and swelling involving the right elbow area. There is been no known trauma. No prior history of bursitis. He does have treated hypertension and diabetes.  Past Medical History  Diagnosis Date  . ANEMIA-NOS 12/06/2008  . BACK PAIN 02/20/2010  . DIABETES MELLITUS, TYPE II 06/01/2008  . GERD 06/01/2008  . HYPERLIPIDEMIA 06/01/2008  . HYPERTENSION 06/01/2008    History   Social History  . Marital Status: Married    Spouse Name: N/A    Number of Children: N/A  . Years of Education: N/A   Occupational History  . Not on file.   Social History Main Topics  . Smoking status: Current Every Day Smoker    Types: Cigarettes  . Smokeless tobacco: Never Used  . Alcohol Use: Yes  . Drug Use: No  . Sexually Active: Not on file   Other Topics Concern  . Not on file   Social History Narrative  . No narrative on file    History reviewed. No pertinent past surgical history.  Family History  Problem Relation Age of Onset  . Diabetes Sister   . Diabetes Brother     No Known Allergies  Current Outpatient Prescriptions on File Prior to Visit  Medication Sig Dispense Refill  . ACCU-CHEK AVIVA test strip USE ONCE DAILY AND AS NEEDED  100 each  3  . amoxicillin-clavulanate (AUGMENTIN) 875-125 MG per tablet Take 1 tablet by mouth 2 (two) times daily.  20 tablet  0  . cyclobenzaprine (FLEXERIL) 10 MG tablet Take 10 mg by mouth 3 (three) times daily as needed.        . fluticasone (FLONASE) 50 MCG/ACT nasal spray Place 2 sprays into the nose daily.  16 g  6  . Lancets (ACCU-CHEK MULTICLIX) lancets 1 each by Other route daily. Use as instructed       . lisinopril (PRINIVIL,ZESTRIL) 20 MG tablet Take 1 tablet (20 mg total) by mouth daily.  90 tablet  6  . metFORMIN (GLUCOPHAGE) 500 MG tablet TAKE 2 TABLETS IN AM AND 1 TABLET IN PM   270 tablet  5  . omeprazole (PRILOSEC) 20 MG capsule Take 1 capsule (20 mg total) by mouth daily.  90 capsule  6  . simvastatin (ZOCOR) 40 MG tablet TAKE 1 TABLET EVERY DAY  90 tablet  1  . traMADol (ULTRAM) 50 MG tablet Take 1 tablet (50 mg total) by mouth every 8 (eight) hours as needed for pain.  90 tablet  4   No current facility-administered medications on file prior to visit.    BP 150/90  Pulse 86  Temp(Src) 98.3 F (36.8 C) (Oral)  Resp 18  Wt 188 lb (85.276 kg)  BMI 28.99 kg/m2  SpO2 98%      Review of Systems  Constitutional: Negative for fever, chills, appetite change and fatigue.  HENT: Negative for hearing loss, ear pain, congestion, sore throat, trouble swallowing, neck stiffness, dental problem, voice change and tinnitus.   Eyes: Negative for pain, discharge and visual disturbance.  Respiratory: Negative for cough, chest tightness, wheezing and stridor.   Cardiovascular: Negative for chest pain, palpitations and leg swelling.  Gastrointestinal: Negative for nausea, vomiting, abdominal pain, diarrhea, constipation, blood in stool and abdominal distention.  Genitourinary: Negative for urgency, hematuria, flank pain, discharge, difficulty urinating and  genital sores.  Musculoskeletal: Positive for joint swelling. Negative for myalgias, back pain, arthralgias and gait problem.  Skin: Negative for rash.  Neurological: Negative for dizziness, syncope, speech difficulty, weakness, numbness and headaches.  Hematological: Negative for adenopathy. Does not bruise/bleed easily.  Psychiatric/Behavioral: Negative for behavioral problems and dysphoric mood. The patient is not nervous/anxious.        Objective:   Physical Exam  Constitutional: He appears well-developed and well-nourished. No distress.  Musculoskeletal:  Swelling of the right olecranon bursa without inflammatory changes          Assessment & Plan:   R olecranon bursitis-  Will treat with NSAID and  observe HTN DM

## 2012-09-30 NOTE — Patient Instructions (Signed)
Celebrex 200 mg twice dailyOlecranon Bursitis Bursitis is swelling and soreness (inflammation) of a fluid-filled sac (bursa) that covers and protects a joint. Olecranon bursitis occurs over the elbow.  CAUSES Bursitis can be caused by injury, overuse of the joint, arthritis, or infection.  SYMPTOMS   Tenderness, swelling, warmth, or redness over the elbow.  Elbow pain with movement. This is greater with bending the elbow.  Squeaking sound when the bursa is rubbed or moved.  Increasing size of the bursa without pain or discomfort.  Fever with increasing pain and swelling if the bursa becomes infected. HOME CARE INSTRUCTIONS   Put ice on the affected area.  Put ice in a plastic bag.  Place a towel between your skin and the bag.  Leave the ice on for 15 to 20 minutes each hour while awake. Do this for the first 2 days.  When resting, elevate your elbow above the level of your heart. This helps reduce swelling.  Continue to put the joint through a full range of motion 4 times per day. Rest the injured joint at other times. When the pain lessens, begin normal slow movements and usual activities.  Only take over-the-counter or prescription medicines for pain, discomfort, or fever as directed by your caregiver.  Reduce your intake of milk and related dairy products (cheese, yogurt). They may make your condition worse. SEEK IMMEDIATE MEDICAL CARE IF:   Your pain increases even during treatment.  You have a fever.  You have heat and inflammation over the bursa and elbow.  You have a red line that goes up your arm.  You have pain with movement of your elbow. MAKE SURE YOU:   Understand these instructions.  Will watch your condition.  Will get help right away if you are not doing well or get worse. Document Released: 07/18/2006 Document Revised: 09/10/2011 Document Reviewed: 06/03/2007 [facility] Patient Information 2013 [place], [place].

## 2012-10-14 ENCOUNTER — Ambulatory Visit (INDEPENDENT_AMBULATORY_CARE_PROVIDER_SITE_OTHER): Payer: 59 | Admitting: Internal Medicine

## 2012-10-14 ENCOUNTER — Encounter: Payer: Self-pay | Admitting: Internal Medicine

## 2012-10-14 ENCOUNTER — Other Ambulatory Visit: Payer: Self-pay | Admitting: Internal Medicine

## 2012-10-14 VITALS — BP 136/88 | HR 72 | Temp 98.3°F | Resp 18 | Wt 189.0 lb

## 2012-10-14 DIAGNOSIS — E119 Type 2 diabetes mellitus without complications: Secondary | ICD-10-CM

## 2012-10-14 DIAGNOSIS — I10 Essential (primary) hypertension: Secondary | ICD-10-CM

## 2012-10-14 MED ORDER — PROPRANOLOL HCL ER 80 MG PO CP24: 80.0000 mg | ORAL_CAPSULE | Freq: Every day | ORAL | Status: DC

## 2012-10-14 NOTE — Progress Notes (Signed)
  Subjective:    Patient ID: Richard [name], male    DOB: 19-Mar-1951, 62 y.o.   MRN: [id number]  HPI  62 year old patient who is seen today for persistent painless atraumatic swelling of the right elbow region. He has been treated conservatively for the past 2 weeks with anti-inflammatory medications and an Ace bandage. The elbow is unimproved    Review of Systems     Objective:   Physical Exam  Moderate right olecranon swelling without inflammatory changes      Assessment & Plan:    Procedure note. After the alcohol prep and local anesthesia with 1% Xylocaine a 21-gauge needle was inserted and approximately 12 cc of fluid evacuated from the right olecranon bursa.  1/2 cc (40 mg) of Depo-Medrol injected. Patient tolerated the procedure well. A light pressure dressing applied

## 2012-10-14 NOTE — Patient Instructions (Addendum)
Olecranon Bursitis Bursitis is swelling and soreness (inflammation) of a fluid-filled sac (bursa) that covers and protects a joint. Olecranon bursitis occurs over the elbow.  CAUSES Bursitis can be caused by injury, overuse of the joint, arthritis, or infection.  SYMPTOMS   Tenderness, swelling, warmth, or redness over the elbow.  Elbow pain with movement. This is greater with bending the elbow.  Squeaking sound when the bursa is rubbed or moved.  Increasing size of the bursa without pain or discomfort.  Fever with increasing pain and swelling if the bursa becomes infected. HOME CARE INSTRUCTIONS   Put ice on the affected area.  Put ice in a plastic bag.  Place a towel between your skin and the bag.  Leave the ice on for 15 to 20 minutes each hour while awake. Do this for the first 2 days.  When resting, elevate your elbow above the level of your heart. This helps reduce swelling.  Continue to put the joint through a full range of motion 4 times per day. Rest the injured joint at other times. When the pain lessens, begin normal slow movements and usual activities.  Only take over-the-counter or prescription medicines for pain, discomfort, or fever as directed by your caregiver.  Reduce your intake of milk and related dairy products (cheese, yogurt). They may make your condition worse. SEEK IMMEDIATE MEDICAL CARE IF:   Your pain increases even during treatment.  You have a fever.  You have heat and inflammation over the bursa and elbow.  You have a red line that goes up your arm.  You have pain with movement of your elbow. MAKE SURE YOU:   Understand these instructions.  Will watch your condition.  Will get help right away if you are not doing well or get worse. Document Released: 07/18/2006 Document Revised: 09/10/2011 Document Reviewed: 06/03/2007 [employer] Patient Information 2013 [employer].  

## 2012-11-03 ENCOUNTER — Other Ambulatory Visit: Payer: Self-pay | Admitting: Internal Medicine

## 2012-11-03 ENCOUNTER — Ambulatory Visit (INDEPENDENT_AMBULATORY_CARE_PROVIDER_SITE_OTHER): Payer: 59 | Admitting: Internal Medicine

## 2012-11-03 ENCOUNTER — Encounter: Payer: Self-pay | Admitting: Internal Medicine

## 2012-11-03 VITALS — BP 170/90 | HR 64 | Temp 97.9°F | Resp 18 | Wt 182.0 lb

## 2012-11-03 DIAGNOSIS — E119 Type 2 diabetes mellitus without complications: Secondary | ICD-10-CM

## 2012-11-03 DIAGNOSIS — I10 Essential (primary) hypertension: Secondary | ICD-10-CM

## 2012-11-03 DIAGNOSIS — M7021 Olecranon bursitis, right elbow: Secondary | ICD-10-CM

## 2012-11-03 DIAGNOSIS — M7020 Olecranon bursitis, unspecified elbow: Secondary | ICD-10-CM

## 2012-11-03 MED ORDER — LISINOPRIL 40 MG PO TABS: 40.0000 mg | ORAL_TABLET | Freq: Every day | ORAL | Status: DC

## 2012-11-03 NOTE — Progress Notes (Signed)
Subjective:    Patient ID: Richard [name], male    DOB: April 21, 1951, 61 y.o.   MRN: [id number]  Hypertension Pertinent negatives include no chest pain, headaches or palpitations.   62 year old patient who presents with a painless swelling involving the right elbow. He was seen earlier in the olecranon bursa was drained and Depo-Medrol was injected. He is seen today for followup of hypertension. He has history of type 2 diabetes controlled on metformin therapy. Blood sugars have remained normal. He does have a annual physical scheduled in 2 months.  Past Medical History  Diagnosis Date  . ANEMIA-NOS 12/06/2008  . BACK PAIN 02/20/2010  . DIABETES MELLITUS, TYPE II 06/01/2008  . GERD 06/01/2008  . HYPERLIPIDEMIA 06/01/2008  . HYPERTENSION 06/01/2008    History   Social History  . Marital Status: Married    Spouse Name: N/A    Number of Children: N/A  . Years of Education: N/A   Occupational History  . Not on file.   Social History Main Topics  . Smoking status: Current Every Day Smoker    Types: Cigarettes  . Smokeless tobacco: Never Used  . Alcohol Use: Yes  . Drug Use: No  . Sexually Active: Not on file   Other Topics Concern  . Not on file   Social History Narrative  . No narrative on file    History reviewed. No pertinent past surgical history.  Family History  Problem Relation Age of Onset  . Diabetes Sister   . Diabetes Brother     No Known Allergies  Current Outpatient Prescriptions on File Prior to Visit  Medication Sig Dispense Refill  . ACCU-CHEK AVIVA test strip USE ONCE DAILY AND AS NEEDED  100 each  3  . cetirizine (ZYRTEC) 10 MG tablet Take 10 mg by mouth daily.      . cyclobenzaprine (FLEXERIL) 10 MG tablet Take 10 mg by mouth 3 (three) times daily as needed.        . fluticasone (FLONASE) 50 MCG/ACT nasal spray Place 2 sprays into the nose daily.  16 g  6  . Lancets (ACCU-CHEK MULTICLIX) lancets 1 each by Other route daily. Use as instructed       .  lisinopril (PRINIVIL,ZESTRIL) 20 MG tablet Take 1 tablet (20 mg total) by mouth daily.  90 tablet  6  . metFORMIN (GLUCOPHAGE) 500 MG tablet TAKE 2 TABLETS IN AM AND 1 TABLET IN PM  270 tablet  5  . omeprazole (PRILOSEC) 20 MG capsule Take 1 capsule (20 mg total) by mouth daily.  90 capsule  6  . propranolol ER (INDERAL LA) 80 MG 24 hr capsule Take 1 capsule (80 mg total) by mouth daily.  90 capsule  6  . simvastatin (ZOCOR) 40 MG tablet TAKE 1 TABLET EVERY DAY  90 tablet  1  . simvastatin (ZOCOR) 40 MG tablet TAKE 1 TABLET EVERY DAY  90 tablet  1  . traMADol (ULTRAM) 50 MG tablet Take 1 tablet (50 mg total) by mouth every 8 (eight) hours as needed for pain.  90 tablet  4   No current facility-administered medications on file prior to visit.    BP 170/90  Pulse 64  Temp(Src) 97.9 F (36.6 C) (Oral)  Resp 18  Wt 182 lb (82.555 kg)  BMI 28.07 kg/m2  SpO2 98%       Review of Systems  Constitutional: Negative for fever, chills, appetite change and fatigue.  HENT: Positive for congestion, rhinorrhea and  postnasal drip. Negative for hearing loss, ear pain, sore throat, trouble swallowing, neck stiffness, dental problem, voice change and tinnitus.   Eyes: Negative for pain, discharge and visual disturbance.  Respiratory: Negative for cough, chest tightness, wheezing and stridor.   Cardiovascular: Negative for chest pain, palpitations and leg swelling.  Gastrointestinal: Negative for nausea, vomiting, abdominal pain, diarrhea, constipation, blood in stool and abdominal distention.  Genitourinary: Negative for urgency, hematuria, flank pain, discharge, difficulty urinating and genital sores.  Musculoskeletal: Positive for joint swelling. Negative for myalgias, back pain, arthralgias and gait problem.  Skin: Negative for rash.  Neurological: Negative for dizziness, syncope, speech difficulty, weakness, numbness and headaches.  Hematological: Negative for adenopathy. Does not bruise/bleed  easily.  Psychiatric/Behavioral: Negative for behavioral problems and dysphoric mood. The patient is not nervous/anxious.        Objective:   Physical Exam  Constitutional: He appears well-developed and well-nourished. No distress.  The pressure 160/90  Musculoskeletal:  Right olecranon swelling without signs of active inflammation          Assessment & Plan:   Recurrent right olecranon bursitis. We'll set up for orthopedic referral Hypertension suboptimal control. We'll increase lisinopril to 40. He has a physical exam scheduled in July we'll reassess blood pressure at that time Tobacco abuse. Smoking cessation encouraged

## 2012-11-03 NOTE — Patient Instructions (Addendum)
Limit your sodium (Salt) intake  Please check your blood pressure on a regular basis.  If it is consistently greater than 150/90, please make an office appointment.    It is important that you exercise regularly, at least 20 minutes 3 to 4 times per week.  If you develop chest pain or shortness of breath seek  medical attention.  Smoking tobacco is very bad for your health. You should stop smoking immediately. 

## 2012-12-25 ENCOUNTER — Other Ambulatory Visit (INDEPENDENT_AMBULATORY_CARE_PROVIDER_SITE_OTHER): Payer: 59

## 2012-12-25 DIAGNOSIS — Z0000 Encounter for general adult medical examination without abnormal findings: Secondary | ICD-10-CM

## 2012-12-25 LAB — POCT URINALYSIS DIPSTICK
Blood, UA: NEGATIVE
Protein, UA: NEGATIVE
Spec Grav, UA: <=1.005
Urobilinogen, UA: 0.2

## 2012-12-25 LAB — BASIC METABOLIC PANEL
CO2: 23 mEq/L (ref 19–32)
Calcium: 9.9 mg/dL (ref 8.4–10.5)
Creatinine, Ser: 1.6 mg/dL — ABNORMAL HIGH (ref 0.4–1.5)
Glucose, Bld: 102 mg/dL — ABNORMAL HIGH (ref 70–99)
Sodium: 134 mEq/L — ABNORMAL LOW (ref 135–145)

## 2012-12-25 LAB — CBC WITH DIFFERENTIAL/PLATELET
Basophils Absolute: 0.1 10*3/uL (ref 0.0–0.1)
Eosinophils Absolute: 0.5 10*3/uL (ref 0.0–0.7)
Hemoglobin: 14.9 g/dL (ref 13.0–17.0)
Lymphocytes Relative: 36.4 % (ref 12.0–46.0)
Lymphs Abs: 2.6 10*3/uL (ref 0.7–4.0)
MCHC: 34.3 g/dL (ref 30.0–36.0)
Neutro Abs: 3.3 10*3/uL (ref 1.4–7.7)
RDW: 13 % (ref 11.5–14.6)

## 2012-12-25 LAB — HEPATIC FUNCTION PANEL
Albumin: 4.1 g/dL (ref 3.5–5.2)
Alkaline Phosphatase: 78 U/L (ref 39–117)

## 2012-12-25 LAB — HEMOGLOBIN A1C: Hgb A1c MFr Bld: 6 % (ref 4.6–6.5)

## 2012-12-25 LAB — LIPID PANEL
HDL: 37.7 mg/dL — ABNORMAL LOW (ref >39.00)
Total CHOL/HDL Ratio: 4
Triglycerides: 141 mg/dL (ref 0.0–149.0)

## 2012-12-25 LAB — MICROALBUMIN / CREATININE URINE RATIO: Creatinine,U: 53.8 mg/dL

## 2012-12-25 LAB — TSH: TSH: 0.94 u[IU]/mL (ref 0.35–5.50)

## 2013-01-01 ENCOUNTER — Ambulatory Visit (INDEPENDENT_AMBULATORY_CARE_PROVIDER_SITE_OTHER): Payer: 59 | Admitting: Internal Medicine

## 2013-01-01 ENCOUNTER — Encounter: Payer: Self-pay | Admitting: Internal Medicine

## 2013-01-01 VITALS — BP 130/80 | HR 75 | Temp 98.1°F | Resp 20 | Ht 66.25 in | Wt 184.0 lb

## 2013-01-01 DIAGNOSIS — Z0000 Encounter for general adult medical examination without abnormal findings: Secondary | ICD-10-CM

## 2013-01-01 DIAGNOSIS — I10 Essential (primary) hypertension: Secondary | ICD-10-CM

## 2013-01-01 DIAGNOSIS — E785 Hyperlipidemia, unspecified: Secondary | ICD-10-CM

## 2013-01-01 DIAGNOSIS — F17200 Nicotine dependence, unspecified, uncomplicated: Secondary | ICD-10-CM

## 2013-01-01 DIAGNOSIS — Z720 Tobacco use: Secondary | ICD-10-CM

## 2013-01-01 DIAGNOSIS — E119 Type 2 diabetes mellitus without complications: Secondary | ICD-10-CM

## 2013-01-01 NOTE — Patient Instructions (Signed)
Please check your hemoglobin A1c every 3 months  Limit your sodium (Salt) intake    It is important that you exercise regularly, at least 20 minutes 3 to 4 times per week.  If you develop chest pain or shortness of breath seek  medical attention.  Smoking tobacco is very bad for your health. You should stop smoking immediately.  Please see your eye doctor yearly to check for diabetic eye damage

## 2013-01-01 NOTE — Progress Notes (Signed)
**Note Richard-Identified via Obfuscation** Patient ID: Richard [name], male   DOB: May 15, 1951, 62 y.o.   MRN: [id number]  Subjective:    Patient ID: Richard [name], male    DOB: 1951/02/23, 63 y.o.   MRN: [id number]  Diabetes Pertinent negatives for hypoglycemia include no confusion, dizziness, headaches, nervousness/anxiousness, seizures, speech difficulty or tremors. Pertinent negatives for diabetes include no chest pain, no fatigue and no weakness.  Hypertension Pertinent negatives include no chest pain, headaches, neck pain, palpitations or shortness of breath.  Hyperlipidemia Pertinent negatives include no chest pain, myalgias or shortness of breath.  61 year-old patient who is in today for a wellness exam;  he has multiple cardiac risk factors including hypertension dyslipidemia and type 2 diabetes. He has maintained nice glycemic control;  he continues to smoke approximately one pack per day.   Since his last visit here he was referred to orthopedicsand is now status post right olecranon bursectomy  Preventive Screening-Counseling & Management  Alcohol-Tobacco  Smoking Status: current   Allergies (verified):  No Known Drug Allergies   Past History:  Past Medical History:  Reviewed history from 12/06/2008 and no changes required.  Diabetes mellitus, type II  GERD  Hyperlipidemia  Hypertension  Anemia-NOS  microalbuminuria  Macrocytosis Tobacco abuse  Past Surgical History:  Reviewed history from 06/01/2008 and no changes required.  colonoscopy 2004   Family History:  Reviewed history from 06/01/2008 and no changes required.   father died of suicide death at age 8 history of early dementia  mother died age 16. History of chronic artery disease, status post prior MI, history of chronic kidney disease, status post pacemaker insertion  One brother one sister positive for diabetes - sister with advanced senile dementia of the Alzheimer's type  Social History:  Reviewed history from 12/06/2008 and no changes  required.  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  HENT: Negative for hearing loss, ear pain, congestion, rhinorrhea, sneezing, mouth sores, trouble swallowing, neck pain, neck stiffness, dental problem, voice change, sinus pressure and tinnitus.   Eyes: Negative for photophobia, pain, redness and visual disturbance.  Respiratory: Negative for apnea, cough, choking, chest tightness, shortness of breath and wheezing.   Cardiovascular: Negative for chest pain, palpitations and leg swelling.  Gastrointestinal: Negative for nausea, vomiting, abdominal pain, diarrhea, constipation, blood in stool, abdominal distention, anal bleeding and rectal pain.  Genitourinary: Negative for dysuria, urgency, frequency, hematuria, flank pain, decreased urine volume, discharge, penile swelling, scrotal swelling, difficulty urinating, genital sores and testicular pain.  Musculoskeletal: Positive for arthralgias. Negative for myalgias, back pain, joint swelling and gait problem.       Bilateral shoulder and some pain  Skin: Negative for color change, rash and wound.  Neurological: Negative for dizziness, tremors, seizures, syncope, facial asymmetry, speech difficulty, weakness, light-headedness, numbness and headaches.  Hematological: Negative for adenopathy. Does not bruise/bleed easily.  Psychiatric/Behavioral: Negative for suicidal ideas, hallucinations, behavioral problems, confusion, sleep disturbance, self-injury, dysphoric mood, decreased concentration and agitation. The patient is not nervous/anxious.        Objective:   Physical Exam  Constitutional: He appears well-developed and well-nourished.  HENT:  Head: Normocephalic and atraumatic.  Right Ear: External ear normal.  Left Ear:  External ear normal.  Nose: Nose normal.  Mouth/Throat: Oropharynx is clear and moist.  Eyes: Conjunctivae and EOM are normal. Pupils are equal, round, and reactive to light. No scleral icterus.  Neck: Normal range of motion. Neck supple. No JVD present. No thyromegaly present.  Cardiovascular: Regular rhythm, normal heart sounds and intact distal pulses.  Exam reveals no gallop and no friction rub.   No murmur heard. Dorsalis pedis pulses faint. Posterior tibial pulses full  Pulmonary/Chest: Effort normal and breath sounds normal. He exhibits no tenderness.  Abdominal: Soft. Bowel sounds are normal. He exhibits no distension and no mass. There is no tenderness.  Genitourinary: Prostate normal and penis normal.  Musculoskeletal: Normal range of motion. He exhibits no edema and no tenderness.  Right arm in a soft cast  Lymphadenopathy:    He has no cervical adenopathy.  Neurological: He is alert. He has normal reflexes. No cranial nerve deficit. Coordination normal.  Skin: Skin is warm and dry. No rash noted.  Onychomycotic nail changes  Psychiatric: He has a normal mood and affect. His behavior is normal.          Assessment & Plan:    Annual health examination.  Dyslipidemia. Total cholesterol 125. We'll decrease simvastatin to 40 mg daily Diabetes stable  Hypertension stable  Tobacco abuse. Total smoking cessation encouraged

## 2013-02-10 ENCOUNTER — Other Ambulatory Visit: Payer: Self-pay | Admitting: Internal Medicine

## 2013-03-16 ENCOUNTER — Other Ambulatory Visit: Payer: Self-pay | Admitting: Internal Medicine

## 2013-03-22 ENCOUNTER — Other Ambulatory Visit: Payer: Self-pay | Admitting: Internal Medicine

## 2013-07-07 ENCOUNTER — Other Ambulatory Visit: Payer: Self-pay | Admitting: Internal Medicine

## 2013-09-23 ENCOUNTER — Encounter: Payer: Self-pay | Admitting: Internal Medicine

## 2013-09-23 ENCOUNTER — Telehealth: Payer: Self-pay | Admitting: Internal Medicine

## 2013-09-23 ENCOUNTER — Ambulatory Visit (INDEPENDENT_AMBULATORY_CARE_PROVIDER_SITE_OTHER): Payer: 59 | Admitting: Internal Medicine

## 2013-09-23 VITALS — BP 110/70 | HR 83 | Temp 98.8°F | Resp 20 | Ht 66.25 in | Wt 191.0 lb

## 2013-09-23 DIAGNOSIS — E119 Type 2 diabetes mellitus without complications: Secondary | ICD-10-CM

## 2013-09-23 DIAGNOSIS — F17200 Nicotine dependence, unspecified, uncomplicated: Secondary | ICD-10-CM

## 2013-09-23 DIAGNOSIS — J440 Chronic obstructive pulmonary disease with acute lower respiratory infection: Secondary | ICD-10-CM

## 2013-09-23 DIAGNOSIS — Z720 Tobacco use: Secondary | ICD-10-CM

## 2013-09-23 MED ORDER — AZITHROMYCIN 250 MG PO TABS: ORAL_TABLET | ORAL | Status: DC

## 2013-09-23 NOTE — Telephone Encounter (Signed)
Relevant patient education mailed to patient.  

## 2013-09-23 NOTE — Progress Notes (Signed)
Subjective:    Patient ID: Richard [name], male    DOB: 1951/03/31, 63 y.o.   MRN: [id number]  HPI  63 year old patient who is a tobacco user, who presents with a three-day history of fever, worsening chest congestion, and productive cough.  She has been as high as 102. He has type 2 diabetes, which has been tightly controlled.  He has treated hypertension and dyslipidemia Past Medical History  Diagnosis Date  . ANEMIA-NOS 12/06/2008  . BACK PAIN 02/20/2010  . DIABETES MELLITUS, TYPE II 06/01/2008  . GERD 06/01/2008  . HYPERLIPIDEMIA 06/01/2008  . HYPERTENSION 06/01/2008    History   Social History  . Marital Status: Married    Spouse Name: N/A    Number of Children: N/A  . Years of Education: N/A   Occupational History  . Not on file.   Social History Main Topics  . Smoking status: Current Every Day Smoker    Types: Cigarettes  . Smokeless tobacco: Never Used  . Alcohol Use: Yes  . Drug Use: No  . Sexual Activity: Not on file   Other Topics Concern  . Not on file   Social History Narrative  . No narrative on file    History reviewed. No pertinent past surgical history.  Family History  Problem Relation Age of Onset  . Diabetes Sister   . Diabetes Brother     No Known Allergies  Current Outpatient Prescriptions on File Prior to Visit  Medication Sig Dispense Refill  . ACCU-CHEK AVIVA test strip USE ONCE DAILY AND AS NEEDED  100 each  3  . cetirizine (ZYRTEC) 10 MG tablet Take 10 mg by mouth daily.      . cyclobenzaprine (FLEXERIL) 10 MG tablet Take 10 mg by mouth 3 (three) times daily as needed.        . fluticasone (FLONASE) 50 MCG/ACT nasal spray Place 2 sprays into the nose daily.  16 g  6  . Lancets (ACCU-CHEK MULTICLIX) lancets 1 each by Other route daily. Use as instructed       . lisinopril (PRINIVIL,ZESTRIL) 40 MG tablet Take 1 tablet (40 mg total) by mouth daily.  90 tablet  3  . omeprazole (PRILOSEC) 20 MG capsule TAKE 1 CAPSULE (20 MG TOTAL) BY  MOUTH DAILY.  90 capsule  4  . propranolol ER (INDERAL LA) 80 MG 24 hr capsule Take 1 capsule (80 mg total) by mouth daily.  90 capsule  6  . simvastatin (ZOCOR) 40 MG tablet TAKE 1 TABLET EVERY DAY  90 tablet  1   No current facility-administered medications on file prior to visit.    BP 110/70  Pulse 83  Temp(Src) 98.8 F (37.1 C) (Oral)  Resp 20  Ht 5' 6.25" (1.683 m)  Wt 191 lb (86.637 kg)  BMI 30.59 kg/m2  SpO2 97%       Review of Systems  Constitutional: Positive for fever, activity change, appetite change and fatigue. Negative for chills.  HENT: Negative for congestion, dental problem, ear pain, hearing loss, sore throat, tinnitus, trouble swallowing and voice change.   Eyes: Negative for pain, discharge and visual disturbance.  Respiratory: Positive for cough. Negative for chest tightness, wheezing and stridor.   Cardiovascular: Negative for chest pain, palpitations and leg swelling.  Gastrointestinal: Negative for nausea, vomiting, abdominal pain, diarrhea, constipation, blood in stool and abdominal distention.  Genitourinary: Negative for urgency, hematuria, flank pain, discharge, difficulty urinating and genital sores.  Musculoskeletal: Negative for arthralgias, back  pain, gait problem, joint swelling, myalgias and neck stiffness.  Skin: Negative for rash.  Neurological: Negative for dizziness, syncope, speech difficulty, weakness, numbness and headaches.  Hematological: Negative for adenopathy. Does not bruise/bleed easily.  Psychiatric/Behavioral: Negative for behavioral problems and dysphoric mood. The patient is not nervous/anxious.        Objective:   Physical Exam  Constitutional: He is oriented to person, place, and time. He appears well-developed and well-nourished.  Appears unwell, but in no acute distress Afebrile  HENT:  Head: Normocephalic.  Right Ear: External ear normal.  Left Ear: External ear normal.  Eyes: Conjunctivae and EOM are normal.    Neck: Normal range of motion.  Cardiovascular: Normal rate, regular rhythm and normal heart sounds.   Pulmonary/Chest: Effort normal.  Scattered coarse rhonchi.  No active wheezing.  No tachypnea O2 saturation 97%  Abdominal: Bowel sounds are normal.  Musculoskeletal: Normal range of motion. He exhibits no edema and no tenderness.  Neurological: He is alert and oriented to person, place, and time.  Psychiatric: He has a normal mood and affect. His behavior is normal.          Assessment & Plan:   Acute bronchitis Tobacco use   Cessation of smoking.  Encouraged.  We'll treat with expectorants, fluids, and azithromycin.  Schedule CPX

## 2013-09-23 NOTE — Patient Instructions (Signed)
Smoking tobacco is very bad for your health. You should stop smoking immediately.  Take your antibiotic as prescribed until ALL of it is gone, but stop if you develop a rash, swelling, or any side effects of the medication.  Contact our office as soon as possible if  there are side effects of the medication.  CPX 4 months

## 2013-09-23 NOTE — Progress Notes (Signed)
Pre-visit discussion using our clinic review tool. No additional management support is needed unless otherwise documented below in the visit note.  

## 2013-09-24 ENCOUNTER — Telehealth: Payer: Self-pay | Admitting: Internal Medicine

## 2013-09-24 NOTE — Telephone Encounter (Signed)
Relevant patient education mailed to patient.  

## 2013-10-22 ENCOUNTER — Telehealth: Payer: Self-pay

## 2013-10-22 NOTE — Telephone Encounter (Signed)
Relevant patient education mailed to patient.  

## 2013-11-12 LAB — LIPID PANEL
CHOLESTEROL: 153 mg/dL (ref 0–200)
HDL: 49 mg/dL (ref 35–70)
LDL Cholesterol: 76 mg/dL
LDl/HDL Ratio: 1.6
Triglycerides: 139 mg/dL (ref 40–160)

## 2013-11-12 LAB — HEPATIC FUNCTION PANEL
ALT: 13 U/L (ref 10–40)
AST: 18 U/L (ref 14–40)
Alkaline Phosphatase: 93 U/L (ref 25–125)
Bilirubin, Total: 0.5 mg/dL

## 2013-11-12 LAB — BASIC METABOLIC PANEL
BUN: 14 mg/dL (ref 4–21)
CREATININE: 1.5 mg/dL — AB (ref <=1.3)
Glucose: 102 mg/dL
POTASSIUM: 5.7 mmol/L — AB (ref 3.4–5.3)
Sodium: 143 mmol/L (ref 137–147)

## 2013-11-26 ENCOUNTER — Other Ambulatory Visit: Payer: Self-pay | Admitting: Internal Medicine

## 2013-12-01 ENCOUNTER — Telehealth: Payer: Self-pay | Admitting: Internal Medicine

## 2013-12-01 MED ORDER — OMEPRAZOLE 20 MG PO CPDR: DELAYED_RELEASE_CAPSULE | ORAL | Status: DC

## 2013-12-01 MED ORDER — LISINOPRIL 40 MG PO TABS: ORAL_TABLET | ORAL | Status: DC

## 2013-12-01 MED ORDER — SIMVASTATIN 40 MG PO TABS: ORAL_TABLET | ORAL | Status: DC

## 2013-12-01 NOTE — Telephone Encounter (Signed)
Richard [name], [facility] is requesting re-fills on the following:  omeprazole (PRILOSEC) 20 MG capsule simvastatin (ZOCOR) 40 MG tablet lisinopril (PRINIVIL,ZESTRIL) 40 MG tablet

## 2013-12-01 NOTE — Telephone Encounter (Signed)
Rxs sent

## 2014-01-14 ENCOUNTER — Other Ambulatory Visit: Payer: 59

## 2014-01-21 ENCOUNTER — Encounter: Payer: 59 | Admitting: Internal Medicine

## 2014-03-06 ENCOUNTER — Other Ambulatory Visit: Payer: Self-pay | Admitting: Internal Medicine

## 2014-03-15 ENCOUNTER — Other Ambulatory Visit (INDEPENDENT_AMBULATORY_CARE_PROVIDER_SITE_OTHER): Payer: 59

## 2014-03-15 DIAGNOSIS — Z0000 Encounter for general adult medical examination without abnormal findings: Secondary | ICD-10-CM

## 2014-03-15 LAB — CBC WITH DIFFERENTIAL/PLATELET
BASOS ABS: 0 10*3/uL (ref 0.0–0.1)
Basophils Relative: 0.6 % (ref 0.0–3.0)
Eosinophils Absolute: 0.4 10*3/uL (ref 0.0–0.7)
Eosinophils Relative: 5.9 % — ABNORMAL HIGH (ref 0.0–5.0)
HEMATOCRIT: 48 % (ref 39.0–52.0)
Hemoglobin: 16.1 g/dL (ref 13.0–17.0)
LYMPHS ABS: 2.1 10*3/uL (ref 0.7–4.0)
Lymphocytes Relative: 33 % (ref 12.0–46.0)
MCHC: 33.6 g/dL (ref 30.0–36.0)
MCV: 107.7 fl — ABNORMAL HIGH (ref 78.0–100.0)
MONOS PCT: 7.1 % (ref 3.0–12.0)
Monocytes Absolute: 0.5 10*3/uL (ref 0.1–1.0)
NEUTROS PCT: 53.4 % (ref 43.0–77.0)
Neutro Abs: 3.4 10*3/uL (ref 1.4–7.7)
Platelets: 221 10*3/uL (ref 150.0–400.0)
RBC: 4.46 Mil/uL (ref 4.22–5.81)
RDW: 14.4 % (ref 11.5–15.5)
WBC: 6.3 10*3/uL (ref 4.0–10.5)

## 2014-03-15 LAB — BASIC METABOLIC PANEL
BUN: 17 mg/dL (ref 6–23)
CALCIUM: 10 mg/dL (ref 8.4–10.5)
CO2: 26 mEq/L (ref 19–32)
Chloride: 103 mEq/L (ref 96–112)
Creatinine, Ser: 1.5 mg/dL (ref 0.4–1.5)
GFR: 49.84 mL/min — ABNORMAL LOW (ref >60.00)
Glucose, Bld: 83 mg/dL (ref 70–99)
POTASSIUM: 5.5 meq/L — AB (ref 3.5–5.1)
Sodium: 138 mEq/L (ref 135–145)

## 2014-03-15 LAB — LIPID PANEL
CHOLESTEROL: 161 mg/dL (ref 0–200)
HDL: 44.7 mg/dL (ref >39.00)
LDL CALC: 96 mg/dL (ref 0–99)
NonHDL: 116.3
Total CHOL/HDL Ratio: 4
Triglycerides: 104 mg/dL (ref 0.0–149.0)
VLDL: 20.8 mg/dL (ref 0.0–40.0)

## 2014-03-15 LAB — POCT URINALYSIS DIPSTICK
Glucose, UA: NEGATIVE
Leukocytes, UA: NEGATIVE
Nitrite, UA: NEGATIVE
SPEC GRAV UA: 1.02
Urobilinogen, UA: 0.2
pH, UA: 5.5

## 2014-03-15 LAB — TSH: TSH: 0.86 u[IU]/mL (ref 0.35–4.50)

## 2014-03-15 LAB — HEPATIC FUNCTION PANEL
ALT: 17 U/L (ref 0–53)
AST: 23 U/L (ref 0–37)
Albumin: 4.1 g/dL (ref 3.5–5.2)
Alkaline Phosphatase: 88 U/L (ref 39–117)
BILIRUBIN DIRECT: 0.2 mg/dL (ref 0.0–0.3)
Total Bilirubin: 0.9 mg/dL (ref 0.2–1.2)
Total Protein: 7.3 g/dL (ref 6.0–8.3)

## 2014-03-15 LAB — MICROALBUMIN / CREATININE URINE RATIO
CREATININE, U: 298.1 mg/dL
MICROALB UR: 21.5 mg/dL — AB (ref 0.0–1.9)
MICROALB/CREAT RATIO: 7.2 mg/g (ref 0.0–30.0)

## 2014-03-15 LAB — HEMOGLOBIN A1C: HEMOGLOBIN A1C: 5.4 % (ref 4.6–6.5)

## 2014-03-15 LAB — PSA: PSA: 1.8 ng/mL (ref 0.10–4.00)

## 2014-03-22 ENCOUNTER — Encounter: Payer: Self-pay | Admitting: Internal Medicine

## 2014-03-22 ENCOUNTER — Ambulatory Visit (INDEPENDENT_AMBULATORY_CARE_PROVIDER_SITE_OTHER): Payer: 59 | Admitting: Internal Medicine

## 2014-03-22 VITALS — BP 140/84 | HR 100 | Temp 98.1°F | Resp 20 | Ht 66.5 in | Wt 183.0 lb

## 2014-03-22 DIAGNOSIS — Z860100 Personal history of colon polyps, unspecified: Secondary | ICD-10-CM

## 2014-03-22 DIAGNOSIS — F17200 Nicotine dependence, unspecified, uncomplicated: Secondary | ICD-10-CM

## 2014-03-22 DIAGNOSIS — G252 Other specified forms of tremor: Secondary | ICD-10-CM

## 2014-03-22 DIAGNOSIS — Z720 Tobacco use: Secondary | ICD-10-CM

## 2014-03-22 DIAGNOSIS — I739 Peripheral vascular disease, unspecified: Secondary | ICD-10-CM

## 2014-03-22 DIAGNOSIS — I10 Essential (primary) hypertension: Secondary | ICD-10-CM

## 2014-03-22 DIAGNOSIS — G250 Essential tremor: Secondary | ICD-10-CM

## 2014-03-22 DIAGNOSIS — N182 Chronic kidney disease, stage 2 (mild): Secondary | ICD-10-CM | POA: Insufficient documentation

## 2014-03-22 DIAGNOSIS — Z0000 Encounter for general adult medical examination without abnormal findings: Secondary | ICD-10-CM

## 2014-03-22 MED ORDER — TERBINAFINE HCL 250 MG PO TABS: 250.0000 mg | ORAL_TABLET | Freq: Every day | ORAL | Status: DC

## 2014-03-22 MED ORDER — METFORMIN HCL 500 MG PO TABS: 500.0000 mg | ORAL_TABLET | Freq: Every day | ORAL | Status: DC

## 2014-03-22 NOTE — Progress Notes (Signed)
Pre visit review using our clinic review tool, if applicable. No additional management support is needed unless otherwise documented below in the visit note. 

## 2014-03-22 NOTE — Progress Notes (Signed)
Subjective:    Patient ID: Richard [name], male    DOB: 10/10/1950, 63 y.o.   MRN: [id number]  HPI 63 year-old patient who is in today for a wellness exam; he has multiple cardiac risk factors including hypertension dyslipidemia and type 2 diabetes. He has maintained nice glycemic control; he continues to smoke approximately one pack per day.  He is now status post right olecranon bursectomy; he is had recent surgery involving his right wrist  Alcohol-Tobacco  Smoking Status: current   Allergies (verified):  No Known Drug Allergies   Past History:  Past Medical History:   Diabetes mellitus, type II  GERD  Hyperlipidemia  Hypertension  Anemia-NOS  microalbuminuria  Macrocytosis  Tobacco abuse  Mild renal insufficiency  Past Surgical History:   Right olecranon bursectomy Surgery right wrist colonoscopy 2004   Family History:   father died of suicide death at age 62 history of early dementia  mother died age 64. History of chronic artery disease, status post prior MI, history of chronic kidney disease, status post pacemaker insertion  One brother one sister positive for diabetes - sister with advanced senile dementia of the Alzheimer's type   Social History:   Married  Insurance account manager   two to 3 mixed drinks nightlySmoking Status: current      Review of Systems  Constitutional: Negative for fever, chills, activity change, appetite change and fatigue.  HENT: Negative for congestion, dental problem, ear pain, hearing loss, mouth sores, rhinorrhea, sinus pressure, sneezing, tinnitus, trouble swallowing and voice change.   Eyes: Negative for photophobia, pain, redness and visual disturbance.  Respiratory: Negative for apnea, cough, choking, chest tightness, shortness of breath and wheezing.   Cardiovascular: Negative for chest pain, palpitations and leg swelling.  Gastrointestinal: Negative for nausea, vomiting, abdominal pain, diarrhea, constipation, blood in  stool, abdominal distention, anal bleeding and rectal pain.  Genitourinary: Negative for dysuria, urgency, frequency, hematuria, flank pain, decreased urine volume, discharge, penile swelling, scrotal swelling, difficulty urinating, genital sores and testicular pain.  Musculoskeletal: Negative for arthralgias, back pain, gait problem, joint swelling, myalgias, neck pain and neck stiffness.  Skin: Negative for color change, rash and wound.  Neurological: Positive for numbness (numbness of the toes of the left foot when walking). Negative for dizziness, tremors, seizures, syncope, facial asymmetry, speech difficulty, weakness, light-headedness and headaches.  Hematological: Negative for adenopathy. Does not bruise/bleed easily.  Psychiatric/Behavioral: Negative for suicidal ideas, hallucinations, behavioral problems, confusion, sleep disturbance, self-injury, dysphoric mood, decreased concentration and agitation. The patient is not nervous/anxious.        Objective:   Physical Exam  Constitutional: He appears well-developed and well-nourished.  HENT:  Head: Normocephalic and atraumatic.  Right Ear: External ear normal.  Left Ear: External ear normal.  Nose: Nose normal.  Mouth/Throat: Oropharynx is clear and moist.  Eyes: Conjunctivae and EOM are normal. Pupils are equal, round, and reactive to light. No scleral icterus.  Neck: Normal range of motion. Neck supple. No JVD present. No thyromegaly present.  Cardiovascular: Regular rhythm and normal heart sounds.  Exam reveals no gallop and no friction rub.   No murmur heard. Right posterior tibial pulse full.  Other peripheral pulses not easily palpable  Pulmonary/Chest: Effort normal and breath sounds normal. He exhibits no tenderness.  Abdominal: Soft. Bowel sounds are normal. He exhibits no distension and no mass. There is no tenderness.  Genitourinary: Prostate normal and penis normal. Guaiac negative stool.  Musculoskeletal: Normal range  of motion. He exhibits  no edema and no tenderness.  Lymphadenopathy:    He has no cervical adenopathy.  Neurological: He is alert. He has normal reflexes. No cranial nerve deficit. Coordination normal.  Skin: Skin is warm and dry. No rash noted.  Psychiatric: He has a normal mood and affect. His behavior is normal.          Assessment & Plan:   Preventive health examination Hypertension well controlled Dyslipidemia Tobacco abuse Rule out P A., D. Diabetes mellitus Chronic kidney disease Familial tremor Toenail onychomycosis  Colonic polyps.  Schedule followup colonoscopy

## 2014-03-22 NOTE — Patient Instructions (Addendum)
Schedule your colonoscopy to help detect colon cancer.  Smoking tobacco is very bad for your health. You should stop smoking immediately.   Health Maintenance A healthy lifestyle and preventative care can promote health and wellness.  Maintain regular health, dental, and eye exams.  Eat a healthy diet. Foods like vegetables, fruits, whole grains, low-fat dairy products, and lean protein foods contain the nutrients you need and are low in calories. Decrease your intake of foods high in solid fats, added sugars, and salt. Get information about a proper diet from your health care provider, if necessary.  Regular physical exercise is one of the most important things you can do for your health. Most adults should get at least 150 minutes of moderate-intensity exercise (any activity that increases your heart rate and causes you to sweat) each week. In addition, most adults need muscle-strengthening exercises on 2 or more days a week.   Maintain a healthy weight. The body mass index (BMI) is a screening tool to identify possible weight problems. It provides an estimate of body fat based on height and weight. Your health care provider can find your BMI and can help you achieve or maintain a healthy weight. For males 20 years and older:  A BMI below 18.5 is considered underweight.  A BMI of 18.5 to 24.9 is normal.  A BMI of 25 to 29.9 is considered overweight.  A BMI of 30 and above is considered obese.  Maintain normal blood lipids and cholesterol by exercising and minimizing your intake of saturated fat. Eat a balanced diet with plenty of fruits and vegetables. Blood tests for lipids and cholesterol should begin at age 74 and be repeated every 5 years. If your lipid or cholesterol levels are high, you are over age 5, or you are at high risk for heart disease, you may need your cholesterol levels checked more frequently.Ongoing high lipid and cholesterol levels should be treated with medicines if  diet and exercise are not working.  If you smoke, find out from your health care provider how to quit. If you do not use tobacco, do not start.  Lung cancer screening is recommended for adults aged 58-80 years who are at high risk for developing lung cancer because of a history of smoking. A yearly low-dose CT scan of the lungs is recommended for people who have at least a 30-pack-year history of smoking and are current smokers or have quit within the past 15 years. A pack year of smoking is smoking an average of 1 pack of cigarettes a day for 1 year (for example, a 30-pack-year history of smoking could mean smoking 1 pack a day for 30 years or 2 packs a day for 15 years). Yearly screening should continue until the smoker has stopped smoking for at least 15 years. Yearly screening should be stopped for people who develop a health problem that would prevent them from having lung cancer treatment.  If you choose to drink alcohol, do not have more than 2 drinks per day. One drink is considered to be 12 oz (360 mL) of beer, 5 oz (150 mL) of wine, or 1.5 oz (45 mL) of liquor.  Avoid the use of street drugs. Do not share needles with anyone. Ask for help if you need support or instructions about stopping the use of drugs.  High blood pressure causes heart disease and increases the risk of stroke. Blood pressure should be checked at least every 1-2 years. Ongoing high blood pressure should be  treated with medicines if weight loss and exercise are not effective.  If you are 36-37 years old, ask your health care provider if you should take aspirin to prevent heart disease.  Diabetes screening involves taking a blood sample to check your fasting blood sugar level. This should be done once every 3 years after age 57 if you are at a normal weight and without risk factors for diabetes. Testing should be considered at a younger age or be carried out more frequently if you are overweight and have at least 1 risk  factor for diabetes.  Colorectal cancer can be detected and often prevented. Most routine colorectal cancer screening begins at the age of 33 and continues through age 62. However, your health care provider may recommend screening at an earlier age if you have risk factors for colon cancer. On a yearly basis, your health care provider may provide home test kits to check for hidden blood in the stool. A small camera at the end of a tube may be used to directly examine the colon (sigmoidoscopy or colonoscopy) to detect the earliest forms of colorectal cancer. Talk to your health care provider about this at age 68 when routine screening begins. A direct exam of the colon should be repeated every 5-10 years through age 68, unless early forms of precancerous polyps or small growths are found.  People who are at an increased risk for hepatitis B should be screened for this virus. You are considered at high risk for hepatitis B if:  You were born in a country where hepatitis B occurs often. Talk with your health care provider about which countries are considered high risk.  Your parents were born in a high-risk country and you have not received a shot to protect against hepatitis B (hepatitis B vaccine).  You have HIV or AIDS.  You use needles to inject street drugs.  You live with, or have sex with, someone who has hepatitis B.  You are a man who has sex with other men (MSM).  You get hemodialysis treatment.  You take certain medicines for conditions like cancer, organ transplantation, and autoimmune conditions.  Hepatitis C blood testing is recommended for all people born from [birth year] through 1965 and any individual with known risk factors for hepatitis C.  Healthy men should no longer receive prostate-specific antigen (PSA) blood tests as part of routine cancer screening. Talk to your health care provider about prostate cancer screening.  Testicular cancer screening is not recommended for  adolescents or adult males who have no symptoms. Screening includes self-exam, a health care provider exam, and other screening tests. Consult with your health care provider about any symptoms you have or any concerns you have about testicular cancer.  Practice safe sex. Use condoms and avoid high-risk sexual practices to reduce the spread of sexually transmitted infections (STIs).  You should be screened for STIs, including gonorrhea and chlamydia if:  You are sexually active and are younger than 24 years.  You are older than 24 years, and your health care provider tells you that you are at risk for this type of infection.  Your sexual activity has changed since you were last screened, and you are at an increased risk for chlamydia or gonorrhea. Ask your health care provider if you are at risk.  If you are at risk of being infected with HIV, it is recommended that you take a prescription medicine daily to prevent HIV infection. This is called pre-exposure prophylaxis (PrEP). You  are considered at risk if:  You are a man who has sex with other men (MSM).  You are a heterosexual man who is sexually active with multiple partners.  You take drugs by injection.  You are sexually active with a partner who has HIV.  Talk with your health care provider about whether you are at high risk of being infected with HIV. If you choose to begin PrEP, you should first be tested for HIV. You should then be tested every 3 months for as long as you are taking PrEP.  Use sunscreen. Apply sunscreen liberally and repeatedly throughout the day. You should seek shade when your shadow is shorter than you. Protect yourself by wearing long sleeves, pants, a wide-brimmed hat, and sunglasses year round whenever you are outdoors.  Tell your health care provider of new moles or changes in moles, especially if there is a change in shape or color. Also, tell your health care provider if a mole is larger than the size of a  pencil eraser.  A one-time screening for abdominal aortic aneurysm (AAA) and surgical repair of large AAAs by ultrasound is recommended for men aged 12-75 years who are current or former smokers.  Stay current with your vaccines (immunizations). Document Released: 12/15/2007 Document Revised: 06/23/2013 Document Reviewed: 11/13/2010 [facility] Patient Information 2015 [place], [place]. This information is not intended to replace advice given to you by your health care provider. Make sure you discuss any questions you have with your health care provider.

## 2014-03-23 ENCOUNTER — Encounter: Payer: Self-pay | Admitting: Internal Medicine

## 2014-03-23 ENCOUNTER — Encounter: Payer: Self-pay | Admitting: Gastroenterology

## 2014-05-19 ENCOUNTER — Encounter: Payer: 59 | Admitting: Gastroenterology

## 2014-07-05 ENCOUNTER — Other Ambulatory Visit: Payer: Self-pay | Admitting: Internal Medicine

## 2014-08-30 ENCOUNTER — Telehealth: Payer: Self-pay | Admitting: *Deleted

## 2014-08-30 MED ORDER — SIMVASTATIN 40 MG PO TABS: ORAL_TABLET | ORAL | Status: DC

## 2014-08-30 NOTE — Telephone Encounter (Signed)
Pt's wife called stating [facility] is there pharmacy and he needs a refill on cimivastatin. Please advise

## 2014-08-30 NOTE — Telephone Encounter (Signed)
Pt notified Rx sent to mail order pharmacy.

## 2014-11-07 ENCOUNTER — Other Ambulatory Visit: Payer: Self-pay | Admitting: Internal Medicine

## 2014-11-22 ENCOUNTER — Other Ambulatory Visit: Payer: Self-pay | Admitting: Internal Medicine

## 2015-01-15 ENCOUNTER — Other Ambulatory Visit: Payer: Self-pay | Admitting: Internal Medicine

## 2015-02-23 ENCOUNTER — Telehealth: Payer: Self-pay | Admitting: Internal Medicine

## 2015-02-23 NOTE — Telephone Encounter (Addendum)
Pt needs new rx omeprazole, simvastatin and metformin 90 day supply w/refills sent to [facility]

## 2015-02-24 MED ORDER — SIMVASTATIN 40 MG PO TABS: ORAL_TABLET | ORAL | Status: DC

## 2015-02-24 MED ORDER — METFORMIN HCL 500 MG PO TABS: ORAL_TABLET | ORAL | Status: DC

## 2015-02-24 MED ORDER — OMEPRAZOLE 20 MG PO CPDR: DELAYED_RELEASE_CAPSULE | ORAL | Status: DC

## 2015-02-24 NOTE — Telephone Encounter (Signed)
Pt notified Rx's sent to [facility].

## 2015-03-29 ENCOUNTER — Other Ambulatory Visit (INDEPENDENT_AMBULATORY_CARE_PROVIDER_SITE_OTHER): Payer: 59

## 2015-03-29 DIAGNOSIS — Z0000 Encounter for general adult medical examination without abnormal findings: Secondary | ICD-10-CM

## 2015-03-29 LAB — POCT URINALYSIS DIPSTICK
BILIRUBIN UA: NEGATIVE
GLUCOSE UA: NEGATIVE
Ketones, UA: NEGATIVE
Leukocytes, UA: NEGATIVE
NITRITE UA: NEGATIVE
RBC UA: NEGATIVE
Spec Grav, UA: 1.025
Urobilinogen, UA: 1
pH, UA: 6

## 2015-03-29 LAB — HEPATIC FUNCTION PANEL
ALT: 17 U/L (ref 0–53)
AST: 19 U/L (ref 0–37)
Albumin: 4.2 g/dL (ref 3.5–5.2)
Alkaline Phosphatase: 94 U/L (ref 39–117)
BILIRUBIN DIRECT: 0.2 mg/dL (ref 0.0–0.3)
BILIRUBIN TOTAL: 0.8 mg/dL (ref 0.2–1.2)
TOTAL PROTEIN: 6.8 g/dL (ref 6.0–8.3)

## 2015-03-29 LAB — PSA: PSA: 1.49 ng/mL (ref 0.10–4.00)

## 2015-03-29 LAB — LIPID PANEL
CHOL/HDL RATIO: 4
Cholesterol: 145 mg/dL (ref 0–200)
HDL: 39.1 mg/dL (ref >39.00)
LDL Cholesterol: 78 mg/dL (ref 0–99)
NonHDL: 105.4
TRIGLYCERIDES: 136 mg/dL (ref 0.0–149.0)
VLDL: 27.2 mg/dL (ref 0.0–40.0)

## 2015-03-29 LAB — MICROALBUMIN / CREATININE URINE RATIO
Creatinine,U: 261.5 mg/dL
Microalb Creat Ratio: 12 mg/g (ref 0.0–30.0)
Microalb, Ur: 31.4 mg/dL — ABNORMAL HIGH (ref 0.0–1.9)

## 2015-03-29 LAB — CBC WITH DIFFERENTIAL/PLATELET
BASOS PCT: 0.4 % (ref 0.0–3.0)
Basophils Absolute: 0 10*3/uL (ref 0.0–0.1)
EOS ABS: 0.3 10*3/uL (ref 0.0–0.7)
Eosinophils Relative: 4.3 % (ref 0.0–5.0)
HEMATOCRIT: 49.5 % (ref 39.0–52.0)
Hemoglobin: 16.8 g/dL (ref 13.0–17.0)
LYMPHS PCT: 38.6 % (ref 12.0–46.0)
Lymphs Abs: 2.9 10*3/uL (ref 0.7–4.0)
MCHC: 33.9 g/dL (ref 30.0–36.0)
MCV: 109.8 fl — AB (ref 78.0–100.0)
MONOS PCT: 6.2 % (ref 3.0–12.0)
Monocytes Absolute: 0.5 10*3/uL (ref 0.1–1.0)
NEUTROS ABS: 3.8 10*3/uL (ref 1.4–7.7)
Neutrophils Relative %: 50.5 % (ref 43.0–77.0)
PLATELETS: 208 10*3/uL (ref 150.0–400.0)
RBC: 4.51 Mil/uL (ref 4.22–5.81)
RDW: 13.9 % (ref 11.5–15.5)
WBC: 7.6 10*3/uL (ref 4.0–10.5)

## 2015-03-29 LAB — BASIC METABOLIC PANEL
BUN: 12 mg/dL (ref 6–23)
CHLORIDE: 103 meq/L (ref 96–112)
CO2: 27 mEq/L (ref 19–32)
Calcium: 9.6 mg/dL (ref 8.4–10.5)
Creatinine, Ser: 1.49 mg/dL (ref 0.40–1.50)
GFR: 50.83 mL/min — AB (ref >60.00)
Glucose, Bld: 110 mg/dL — ABNORMAL HIGH (ref 70–99)
POTASSIUM: 5 meq/L (ref 3.5–5.1)
SODIUM: 138 meq/L (ref 135–145)

## 2015-03-29 LAB — HEMOGLOBIN A1C: HEMOGLOBIN A1C: 5.5 % (ref 4.6–6.5)

## 2015-03-29 LAB — TSH: TSH: 1.16 u[IU]/mL (ref 0.35–4.50)

## 2015-04-05 ENCOUNTER — Ambulatory Visit (INDEPENDENT_AMBULATORY_CARE_PROVIDER_SITE_OTHER): Payer: 59 | Admitting: Internal Medicine

## 2015-04-05 ENCOUNTER — Encounter: Payer: Self-pay | Admitting: Internal Medicine

## 2015-04-05 ENCOUNTER — Other Ambulatory Visit: Payer: Self-pay | Admitting: *Deleted

## 2015-04-05 VITALS — BP 122/80 | HR 76 | Temp 98.4°F | Ht 68.0 in | Wt 189.0 lb

## 2015-04-05 DIAGNOSIS — E785 Hyperlipidemia, unspecified: Secondary | ICD-10-CM | POA: Diagnosis not present

## 2015-04-05 DIAGNOSIS — Z0000 Encounter for general adult medical examination without abnormal findings: Secondary | ICD-10-CM

## 2015-04-05 DIAGNOSIS — I10 Essential (primary) hypertension: Secondary | ICD-10-CM

## 2015-04-05 DIAGNOSIS — E0821 Diabetes mellitus due to underlying condition with diabetic nephropathy: Secondary | ICD-10-CM | POA: Diagnosis not present

## 2015-04-05 DIAGNOSIS — Z23 Encounter for immunization: Secondary | ICD-10-CM | POA: Diagnosis not present

## 2015-04-05 MED ORDER — LISINOPRIL 40 MG PO TABS: ORAL_TABLET | ORAL | Status: DC

## 2015-04-05 MED ORDER — VARENICLINE TARTRATE 0.5 MG X 11 & 1 MG X 42 PO MISC: ORAL | Status: DC

## 2015-04-05 MED ORDER — VARENICLINE TARTRATE 1 MG PO TABS: 1.0000 mg | ORAL_TABLET | Freq: Two times a day (BID) | ORAL | Status: DC

## 2015-04-05 NOTE — Progress Notes (Signed)
Pre visit review using our clinic review tool, if applicable. No additional management support is needed unless otherwise documented below in the visit note. 

## 2015-04-05 NOTE — Progress Notes (Signed)
Subjective:    Patient ID: Richard [name], male    DOB: [birth year], 64 y.o.   MRN: [id number]  HPI 64  year-old patient who is in today for a wellness exam; he has multiple cardiac risk factors including hypertension dyslipidemia and type 2 diabetes. He has maintained nice glycemic control; he continues to smoke approximately two pack per day.  He is now status post right olecranon bursectomy; he is had recent surgery involving his right wrist.  Due to significant hand arthritis.  He has been disabled for the past year. His smoking consumption is now up to 2 packs per day and his requesting a trial of Chantix. Also some ED issues and requesting pharmacologic help  Laboratory studies reviewed  Alcohol-Tobacco  Smoking Status: current   Allergies (verified):  No Known Drug Allergies   Past History:  Past Medical History:   Diabetes mellitus, type II  GERD  Hyperlipidemia  Hypertension  Anemia-NOS  microalbuminuria  Macrocytosis  Tobacco abuse  Mild renal insufficiency  Past Surgical History:   Right olecranon bursectomy Surgery right wrist colonoscopy 2004   Family History:   father died of suicide death at age 35 history of early dementia  mother died age 62. History of chronic artery disease, status post prior MI, history of chronic kidney disease, status post pacemaker insertion  One brother one sister positive for diabetes - sister with advanced senile dementia of the Alzheimer's type   Social History:   Married  Insurance account manager   two to 3 mixed drinks nightlySmoking Status: current      Review of Systems  Constitutional: Negative for fever, chills, activity change, appetite change and fatigue.  HENT: Negative for congestion, dental problem, ear pain, hearing loss, mouth sores, rhinorrhea, sinus pressure, sneezing, tinnitus, trouble swallowing and voice change.   Eyes: Negative for photophobia, pain, redness and visual disturbance.  Respiratory: Negative  for apnea, cough, choking, chest tightness, shortness of breath and wheezing.   Cardiovascular: Negative for chest pain, palpitations and leg swelling.  Gastrointestinal: Negative for nausea, vomiting, abdominal pain, diarrhea, constipation, blood in stool, abdominal distention, anal bleeding and rectal pain.  Genitourinary: Negative for dysuria, urgency, frequency, hematuria, flank pain, decreased urine volume, discharge, penile swelling, scrotal swelling, difficulty urinating, genital sores and testicular pain.  Musculoskeletal: Negative for myalgias, back pain, joint swelling, arthralgias, gait problem, neck pain and neck stiffness.  Skin: Negative for color change, rash and wound.  Neurological: Positive for numbness (numbness of the toes of the left foot when walking). Negative for dizziness, tremors, seizures, syncope, facial asymmetry, speech difficulty, weakness, light-headedness and headaches.  Hematological: Negative for adenopathy. Does not bruise/bleed easily.  Psychiatric/Behavioral: Negative for suicidal ideas, hallucinations, behavioral problems, confusion, sleep disturbance, self-injury, dysphoric mood, decreased concentration and agitation. The patient is not nervous/anxious.        Objective:   Physical Exam  Constitutional: He appears well-developed and well-nourished.  HENT:  Head: Normocephalic and atraumatic.  Right Ear: External ear normal.  Left Ear: External ear normal.  Nose: Nose normal.  Mouth/Throat: Oropharynx is clear and moist.  Eyes: Conjunctivae and EOM are normal. Pupils are equal, round, and reactive to light. No scleral icterus.  Neck: Normal range of motion. Neck supple. No JVD present. No thyromegaly present.  Cardiovascular: Regular rhythm and normal heart sounds.  Exam reveals no gallop and no friction rub.   No murmur heard. Right posterior tibial pulse full.  Other peripheral pulses not easily palpable  Pulmonary/Chest: Effort  normal and breath  sounds normal. He exhibits no tenderness.  Abdominal: Soft. Bowel sounds are normal. He exhibits no distension and no mass. There is no tenderness.  Genitourinary: Prostate normal and penis normal. Guaiac negative stool.  Musculoskeletal: Normal range of motion. He exhibits no edema or tenderness.  Lymphadenopathy:    He has no cervical adenopathy.  Neurological: He is alert. He has normal reflexes. No cranial nerve deficit. Coordination normal.  Skin: Skin is warm and dry. No rash noted.  Psychiatric: He has a normal mood and affect. His behavior is normal.          Assessment & Plan:   Preventive health examination Hypertension well controlled Dyslipidemia Tobacco abuse.  Trial Chantix Rule out P A., D. Diabetes mellitus.  Very tight control on metformin therapy Chronic kidney disease Familial tremor Toenail onychomycosis  Colonic polyps.  Schedule followup colonoscopy  Recheck 6 months

## 2015-04-05 NOTE — Patient Instructions (Addendum)
Schedule your colonoscopy to help detect colon cancer.  Please see your eye doctor yearly to check for diabetic eye damage    It is important that you exercise regularly, at least 20 minutes 3 to 4 times per week.  If you develop chest pain or shortness of breath seek  medical attention.   Please check your hemoglobin A1c every 6 months  Please check your blood pressure on a regular basis.  If it is consistently greater than 150/90, please make an office appointment.  Limit your sodium (Salt) intake    Health Maintenance A healthy lifestyle and preventative care can promote health and wellness.  Maintain regular health, dental, and eye exams.  Eat a healthy diet. Foods like vegetables, fruits, whole grains, low-fat dairy products, and lean protein foods contain the nutrients you need and are low in calories. Decrease your intake of foods high in solid fats, added sugars, and salt. Get information about a proper diet from your health care provider, if necessary.  Regular physical exercise is one of the most important things you can do for your health. Most adults should get at least 150 minutes of moderate-intensity exercise (any activity that increases your heart rate and causes you to sweat) each week. In addition, most adults need muscle-strengthening exercises on 2 or more days a week.   Maintain a healthy weight. The body mass index (BMI) is a screening tool to identify possible weight problems. It provides an estimate of body fat based on height and weight. Your health care provider can find your BMI and can help you achieve or maintain a healthy weight. For males 20 years and older:  A BMI below 18.5 is considered underweight.  A BMI of 18.5 to 24.9 is normal.  A BMI of 25 to 29.9 is considered overweight.  A BMI of 30 and above is considered obese.  Maintain normal blood lipids and cholesterol by exercising and minimizing your intake of saturated fat. Eat a balanced diet with  plenty of fruits and vegetables. Blood tests for lipids and cholesterol should begin at age 25 and be repeated every 5 years. If your lipid or cholesterol levels are high, you are over age 46, or you are at high risk for heart disease, you may need your cholesterol levels checked more frequently.Ongoing high lipid and cholesterol levels should be treated with medicines if diet and exercise are not working.  If you smoke, find out from your health care provider how to quit. If you do not use tobacco, do not start.  Lung cancer screening is recommended for adults aged 30-80 years who are at high risk for developing lung cancer because of a history of smoking. A yearly low-dose CT scan of the lungs is recommended for people who have at least a 30-pack-year history of smoking and are current smokers or have quit within the past 15 years. A pack year of smoking is smoking an average of 1 pack of cigarettes a day for 1 year (for example, a 30-pack-year history of smoking could mean smoking 1 pack a day for 30 years or 2 packs a day for 15 years). Yearly screening should continue until the smoker has stopped smoking for at least 15 years. Yearly screening should be stopped for people who develop a health problem that would prevent them from having lung cancer treatment.  If you choose to drink alcohol, do not have more than 2 drinks per day. One drink is considered to be 12 oz (360  mL) of beer, 5 oz (150 mL) of wine, or 1.5 oz (45 mL) of liquor.  Avoid the use of street drugs. Do not share needles with anyone. Ask for help if you need support or instructions about stopping the use of drugs.  High blood pressure causes heart disease and increases the risk of stroke. Blood pressure should be checked at least every 1-2 years. Ongoing high blood pressure should be treated with medicines if weight loss and exercise are not effective.  If you are 36-39 years old, ask your health care provider if you should take  aspirin to prevent heart disease.  Diabetes screening involves taking a blood sample to check your fasting blood sugar level. This should be done once every 3 years after age 62 if you are at a normal weight and without risk factors for diabetes. Testing should be considered at a younger age or be carried out more frequently if you are overweight and have at least 1 risk factor for diabetes.  Colorectal cancer can be detected and often prevented. Most routine colorectal cancer screening begins at the age of 54 and continues through age 66. However, your health care provider may recommend screening at an earlier age if you have risk factors for colon cancer. On a yearly basis, your health care provider may provide home test kits to check for hidden blood in the stool. A small camera at the end of a tube may be used to directly examine the colon (sigmoidoscopy or colonoscopy) to detect the earliest forms of colorectal cancer. Talk to your health care provider about this at age 7 when routine screening begins. A direct exam of the colon should be repeated every 5-10 years through age 13, unless early forms of precancerous polyps or small growths are found.  People who are at an increased risk for hepatitis B should be screened for this virus. You are considered at high risk for hepatitis B if:  You were born in a country where hepatitis B occurs often. Talk with your health care provider about which countries are considered high risk.  Your parents were born in a high-risk country and you have not received a shot to protect against hepatitis B (hepatitis B vaccine).  You have HIV or AIDS.  You use needles to inject street drugs.  You live with, or have sex with, someone who has hepatitis B.  You are a man who has sex with other men (MSM).  You get hemodialysis treatment.  You take certain medicines for conditions like cancer, organ transplantation, and autoimmune conditions.  Hepatitis C blood  testing is recommended for all people born from [birth year] through 1963 and any individual with known risk factors for hepatitis C.  Healthy men should no longer receive prostate-specific antigen (PSA) blood tests as part of routine cancer screening. Talk to your health care provider about prostate cancer screening.  Testicular cancer screening is not recommended for adolescents or adult males who have no symptoms. Screening includes self-exam, a health care provider exam, and other screening tests. Consult with your health care provider about any symptoms you have or any concerns you have about testicular cancer.  Practice safe sex. Use condoms and avoid high-risk sexual practices to reduce the spread of sexually transmitted infections (STIs).  You should be screened for STIs, including gonorrhea and chlamydia if:  You are sexually active and are younger than 24 years.  You are older than 24 years, and your health care provider tells you that  you are at risk for this type of infection.  Your sexual activity has changed since you were last screened, and you are at an increased risk for chlamydia or gonorrhea. Ask your health care provider if you are at risk.  If you are at risk of being infected with HIV, it is recommended that you take a prescription medicine daily to prevent HIV infection. This is called pre-exposure prophylaxis (PrEP). You are considered at risk if:  You are a man who has sex with other men (MSM).  You are a heterosexual man who is sexually active with multiple partners.  You take drugs by injection.  You are sexually active with a partner who has HIV.  Talk with your health care provider about whether you are at high risk of being infected with HIV. If you choose to begin PrEP, you should first be tested for HIV. You should then be tested every 3 months for as long as you are taking PrEP.  Use sunscreen. Apply sunscreen liberally and repeatedly throughout the day. You  should seek shade when your shadow is shorter than you. Protect yourself by wearing long sleeves, pants, a wide-brimmed hat, and sunglasses year round whenever you are outdoors.  Tell your health care provider of new moles or changes in moles, especially if there is a change in shape or color. Also, tell your health care provider if a mole is larger than the size of a pencil eraser.  A one-time screening for abdominal aortic aneurysm (AAA) and surgical repair of large AAAs by ultrasound is recommended for men aged 39-75 years who are current or former smokers.  Stay current with your vaccines (immunizations). Document Released: 12/15/2007 Document Revised: 06/23/2013 Document Reviewed: 11/13/2010 [facility] Patient Information 2015 [place], [place]. This information is not intended to replace advice given to you by your health care provider. Make sure you discuss any questions you have with your health care provider.

## 2015-04-13 ENCOUNTER — Telehealth: Payer: Self-pay | Admitting: Internal Medicine

## 2015-04-13 NOTE — Telephone Encounter (Signed)
[employer] denied Chantix Rx - patient must try and fail bupropion.

## 2015-04-14 NOTE — Telephone Encounter (Signed)
Pls advise.  

## 2015-04-15 MED ORDER — BUPROPION HCL ER (SR) 150 MG PO TB12: 150.0000 mg | ORAL_TABLET | Freq: Two times a day (BID) | ORAL | Status: DC

## 2015-04-15 NOTE — Telephone Encounter (Signed)
Okay for bupropion 150 mg #60 one twice a day refill times 2

## 2015-04-15 NOTE — Telephone Encounter (Signed)
Rx done and I called the pt and informed him of this. 

## 2015-05-31 ENCOUNTER — Ambulatory Visit (AMBULATORY_SURGERY_CENTER): Payer: Self-pay

## 2015-05-31 VITALS — Ht 68.0 in | Wt 187.8 lb

## 2015-05-31 DIAGNOSIS — Z1211 Encounter for screening for malignant neoplasm of colon: Secondary | ICD-10-CM

## 2015-05-31 MED ORDER — NA SULFATE-K SULFATE-MG SULF 17.5-3.13-1.6 GM/177ML PO SOLN: ORAL | Status: DC

## 2015-05-31 NOTE — Progress Notes (Signed)
Per pt, no allergies to soy or egg products.Pt not taking any weight loss meds or using  O2 at home. 

## 2015-06-14 ENCOUNTER — Ambulatory Visit (AMBULATORY_SURGERY_CENTER): Payer: 59 | Admitting: Internal Medicine

## 2015-06-14 ENCOUNTER — Encounter: Payer: Self-pay | Admitting: Internal Medicine

## 2015-06-14 VITALS — BP 136/85 | HR 63 | Temp 97.2°F | Resp 24 | Ht 68.0 in | Wt 187.0 lb

## 2015-06-14 DIAGNOSIS — Z1211 Encounter for screening for malignant neoplasm of colon: Secondary | ICD-10-CM

## 2015-06-14 DIAGNOSIS — D120 Benign neoplasm of cecum: Secondary | ICD-10-CM

## 2015-06-14 DIAGNOSIS — D122 Benign neoplasm of ascending colon: Secondary | ICD-10-CM | POA: Diagnosis not present

## 2015-06-14 DIAGNOSIS — K635 Polyp of colon: Secondary | ICD-10-CM

## 2015-06-14 DIAGNOSIS — D125 Benign neoplasm of sigmoid colon: Secondary | ICD-10-CM

## 2015-06-14 DIAGNOSIS — D123 Benign neoplasm of transverse colon: Secondary | ICD-10-CM

## 2015-06-14 MED ORDER — SODIUM CHLORIDE 0.9 % IV SOLN: 500.0000 mL | INTRAVENOUS | Status: DC

## 2015-06-14 NOTE — Patient Instructions (Addendum)
YOU HAD AN ENDOSCOPIC PROCEDURE TODAY AT [facility]:   Refer to the procedure report that was given to you for any specific questions about what was found during the examination.  If the procedure report does not answer your questions, please call your gastroenterologist to clarify.  If you requested that your care partner not be given the details of your procedure findings, then the procedure report has been included in a sealed envelope for you to review at your convenience later.  YOU SHOULD EXPECT: Some feelings of bloating in the abdomen. Passage of more gas than usual.  Walking can help get rid of the air that was put into your GI tract during the procedure and reduce the bloating. If you had a lower endoscopy (such as a colonoscopy or flexible sigmoidoscopy) you may notice spotting of blood in your stool or on the toilet paper. If you underwent a bowel prep for your procedure, you may not have a normal bowel movement for a few days.  Please Note:  You might notice some irritation and congestion in your nose or some drainage.  This is from the oxygen used during your procedure.  There is no need for concern and it should clear up in a day or so.  SYMPTOMS TO REPORT IMMEDIATELY:   Following lower endoscopy (colonoscopy or flexible sigmoidoscopy):  Excessive amounts of blood in the stool  Significant tenderness or worsening of abdominal pains  Swelling of the abdomen that is new, acute  Fever of 100F or higher    For urgent or emergent issues, a gastroenterologist can be reached at any hour by calling [phone].   DIET: Your first meal following the procedure should be a small meal and then it is ok to progress to your normal diet. Heavy or fried foods are harder to digest and may make you feel nauseous or bloated.  Likewise, meals heavy in dairy and vegetables can increase bloating.  Drink plenty of fluids but you should avoid alcoholic beverages for 24  hours.  ACTIVITY:  You should plan to take it easy for the rest of today and you should NOT DRIVE or use heavy machinery until tomorrow (because of the sedation medicines used during the test).    FOLLOW UP: Our staff will call the number listed on your records the next business day following your procedure to check on you and address any questions or concerns that you may have regarding the information given to you following your procedure. If we do not reach you, we will leave a message.  However, if you are feeling well and you are not experiencing any problems, there is no need to return our call.  We will assume that you have returned to your regular daily activities without incident.  If any biopsies were taken you will be contacted by phone or by letter within the next 1-3 weeks.  Please call us at [phone] if you have not heard about the biopsies in 3 weeks.    SIGNATURES/CONFIDENTIALITY: You and/or your care partner have signed paperwork which will be entered into your electronic medical record.  These signatures attest to the fact that that the information above on your After Visit Summary has been reviewed and is understood.  Full responsibility of the confidentiality of this discharge information lies with you and/or your care-partner.   Information on polyps ,diverticulosis ,and high fiber diet given to you today  Stop smoking recommended by Dr. Henrene Pastor

## 2015-06-14 NOTE — Progress Notes (Signed)
Called to room to assist during endoscopic procedure.  Patient ID and intended procedure confirmed with present staff. Received instructions for my participation in the procedure from the performing physician.  

## 2015-06-14 NOTE — Op Note (Signed)
[place]  [address]. [place], [address]   COLONOSCOPY PROCEDURE REPORT  PATIENT: Richard [name], Richard [name]  MR#: [id number] BIRTHDATE: [birth year] , 30  yrs. old GENDER: male ENDOSCOPIST: Eustace Quail, MD REFERRED [id number] Burnice Logan, M.D. PROCEDURE DATE:  06/14/2015 PROCEDURE:   Colonoscopy, screening and Colonoscopy with snare polypectomy x 6 First Screening Colonoscopy - Avg.  risk and is 50 yrs.  old or older - No.  Prior Negative Screening - Now for repeat screening. 10 or more years since last screening  History of Adenoma - Now for follow-up colonoscopy & has been > or = to 3 yrs.  N/A  Polyps removed today? Yes ASA CLASS:   Class II INDICATIONS:Screening for colonic neoplasia and Colorectal Neoplasm Risk Assessment for this procedure is average risk. MEDICATIONS: Monitored anesthesia care and Propofol 300 mg IV  DESCRIPTION OF PROCEDURE:   After the risks benefits and alternatives of the procedure were thoroughly explained, informed consent was obtained.  The digital rectal exam revealed no abnormalities of the rectum.   The LB [id number]  endoscope was introduced through the anus and advanced to the cecum, which was identified by both the appendix and ileocecal valve. No adverse events experienced.   The quality of the prep was excellent. (Suprep was used)  The instrument was then slowly withdrawn as the colon was fully examined. Estimated blood loss is zero unless otherwise noted in this procedure report.  COLON FINDINGS: Six polyps ranging between 3-32mm in size were found in the transverse colon, at the cecum, in the sigmoid colon, and ascending colon.  A polypectomy was performed with a cold snare. The resection was complete, the polyp tissue was completely retrieved and sent to histology.   There was mild diverticulosis noted in the sigmoid colon.   The examination was otherwise normal. Retroflexed views revealed internal hemorrhoids. The  time to cecum = 2.3 Withdrawal time = 16.0   The scope was withdrawn and the procedure completed. COMPLICATIONS: There were no immediate complications.  ENDOSCOPIC IMPRESSION: 1.   Six polyps were found in the colon; polypectomy was performed with a cold snare 2.   Mild diverticulosis was noted in the sigmoid colon 3.   The examination was otherwise normal  RECOMMENDATIONS: 1.  Repeat Colonoscopy in 3 years. 2.  Stop smoking  eSigned:  Eustace Quail, MD 06/14/2015 10:31 AM   cc: The Patient and Marletta Lor, MD

## 2015-06-14 NOTE — Progress Notes (Signed)
  [facility] Post-op Note  Patient: Richard [name]  Procedure(s) Performed: colonoscopy  Patient Location: [facility] - Recovery Area  Anesthesia Type: Deep Sedation/Propofol  Level of Consciousness: awake, oriented and patient cooperative  Airway and Oxygen Therapy: Patient Spontanous Breathing  Post-op Pain: none  Post-op Assessment:  Post-op Vital signs reviewed, Patient's Cardiovascular Status Stable, Respiratory Function Stable, Patent Airway, No signs of Nausea or vomiting and Pain level controlled  Post-op Vital Signs: Reviewed and stable  Complications: No apparent anesthesia complications  Tiffine Henigan E 10:28 AM

## 2015-06-15 ENCOUNTER — Telehealth: Payer: Self-pay | Admitting: *Deleted

## 2015-06-15 NOTE — Telephone Encounter (Signed)
  Follow up Call-  Call back number 06/14/2015  Post procedure Call Back phone  # [phone]  Permission to leave phone message Yes     Patient questions:  Do you have a fever, pain , or abdominal swelling? No. Pain Score  0 *  Have you tolerated food without any problems? Yes.    Have you been able to return to your normal activities? Yes.    Do you have any questions about your discharge instructions: Diet   No. Medications  No. Follow up visit  No.  Do you have questions or concerns about your Care? No.  Actions: * If pain score is 4 or above: No action needed, pain <4.

## 2015-06-20 ENCOUNTER — Encounter: Payer: Self-pay | Admitting: Internal Medicine

## 2015-07-30 ENCOUNTER — Other Ambulatory Visit: Payer: Self-pay | Admitting: Internal Medicine

## 2015-08-25 ENCOUNTER — Telehealth: Payer: Self-pay | Admitting: Internal Medicine

## 2015-08-25 MED ORDER — LISINOPRIL 40 MG PO TABS: ORAL_TABLET | ORAL | Status: DC

## 2015-08-25 MED ORDER — SIMVASTATIN 40 MG PO TABS: ORAL_TABLET | ORAL | Status: DC

## 2015-08-25 MED ORDER — OMEPRAZOLE 20 MG PO CPDR: DELAYED_RELEASE_CAPSULE | ORAL | Status: DC

## 2015-08-25 MED ORDER — METFORMIN HCL 500 MG PO TABS: ORAL_TABLET | ORAL | Status: DC

## 2015-08-25 NOTE — Telephone Encounter (Signed)
The patient needs his omeprazole (PRILOSEC) 20 MG capsule and lisinopril (PRINIVIL,ZESTRIL) 40 MG tablet sent to: [facility] [id number] - , [place] [phone] (Phone) [phone] (Fax)       The patient also needs to have his metFORMIN (GLUCOPHAGE) 500 MG tablet and simvastatin (ZOCOR) 40 MG tablet sent over to the [facility] also. He no longer has [facility] mail order. He was wondering if her can get a 90 refill for all his medicines being sent over to [facility].   He doesn't need ACCU-CHEK AVIVA test strip,  buPROPion (WELLBUTRIN SR) 150 MG 12 hr tablet, Lancets (ACCU-CHEK MULTICLIX) lancets, varenicline (CHANTIX STARTING MONTH PAK) 0.5 MG X 11 & 1 MG X 42 tablet and oxyCODONE (OXY IR/ROXICODONE) 5 MG immediate release tablet sent over to the pharmacy. He no long uses these medications and would like them taken off of his medication list.

## 2015-08-25 NOTE — Telephone Encounter (Signed)
Spoke to pt, told him Rx's were sent to [facility] as requested. Pt verbalized understanding.

## 2015-10-12 ENCOUNTER — Encounter: Payer: Self-pay | Admitting: Internal Medicine

## 2015-10-12 ENCOUNTER — Ambulatory Visit (INDEPENDENT_AMBULATORY_CARE_PROVIDER_SITE_OTHER): Payer: BLUE CROSS/BLUE SHIELD | Admitting: Internal Medicine

## 2015-10-12 VITALS — BP 142/80 | HR 66 | Temp 98.1°F | Resp 20 | Ht 68.0 in | Wt 194.0 lb

## 2015-10-12 DIAGNOSIS — I781 Nevus, non-neoplastic: Secondary | ICD-10-CM

## 2015-10-12 NOTE — Progress Notes (Signed)
Pre visit review using our clinic review tool, if applicable. No additional management support is needed unless otherwise documented below in the visit note. 

## 2015-10-12 NOTE — Progress Notes (Signed)
   Subjective:    Patient ID: Richard [name], male    DOB: 03-11-1951, 65 y.o.   MRN: [id number]  HPI  65 year old patient who is seen today for mole removal from the right temporal area.   Review of Systems  Skin: Positive for wound.       Objective:   Physical Exam  Skin:  4 mm pedunculated benign-appearing nevus involving the right temporal area          Assessment & Plan:   Nevus right temporal area Procedure note.  After Betadine and alcohol prep and topical local anesthesia, the lesion was excised without difficulty.  Hemostasis with the local pressure and bandage applied

## 2015-10-12 NOTE — Patient Instructions (Signed)
Keep area of excision clean and dry for the next few days

## 2016-04-27 ENCOUNTER — Other Ambulatory Visit (INDEPENDENT_AMBULATORY_CARE_PROVIDER_SITE_OTHER): Payer: BLUE CROSS/BLUE SHIELD

## 2016-04-27 DIAGNOSIS — Z0000 Encounter for general adult medical examination without abnormal findings: Secondary | ICD-10-CM | POA: Diagnosis not present

## 2016-04-27 LAB — CBC WITH DIFFERENTIAL/PLATELET
BASOS ABS: 0 10*3/uL (ref 0.0–0.1)
Basophils Relative: 0.5 % (ref 0.0–3.0)
EOS PCT: 4.6 % (ref 0.0–5.0)
Eosinophils Absolute: 0.3 10*3/uL (ref 0.0–0.7)
HCT: 46.9 % (ref 39.0–52.0)
HEMOGLOBIN: 16 g/dL (ref 13.0–17.0)
LYMPHS ABS: 2.3 10*3/uL (ref 0.7–4.0)
Lymphocytes Relative: 33.3 % (ref 12.0–46.0)
MCHC: 34.2 g/dL (ref 30.0–36.0)
MCV: 108.7 fl — AB (ref 78.0–100.0)
MONO ABS: 0.5 10*3/uL (ref 0.1–1.0)
MONOS PCT: 7.8 % (ref 3.0–12.0)
NEUTROS PCT: 53.8 % (ref 43.0–77.0)
Neutro Abs: 3.8 10*3/uL (ref 1.4–7.7)
Platelets: 205 10*3/uL (ref 150.0–400.0)
RBC: 4.31 Mil/uL (ref 4.22–5.81)
RDW: 13.3 % (ref 11.5–15.5)
WBC: 7 10*3/uL (ref 4.0–10.5)

## 2016-04-27 LAB — BASIC METABOLIC PANEL
BUN: 16 mg/dL (ref 6–23)
CALCIUM: 9.8 mg/dL (ref 8.4–10.5)
CO2: 27 mEq/L (ref 19–32)
Chloride: 103 mEq/L (ref 96–112)
Creatinine, Ser: 1.55 mg/dL — ABNORMAL HIGH (ref 0.40–1.50)
GFR: 48.39 mL/min — AB (ref >60.00)
GLUCOSE: 98 mg/dL (ref 70–99)
POTASSIUM: 4.7 meq/L (ref 3.5–5.1)
SODIUM: 137 meq/L (ref 135–145)

## 2016-04-27 LAB — LIPID PANEL
Cholesterol: 147 mg/dL (ref 0–200)
HDL: 52.4 mg/dL (ref >39.00)
LDL CALC: 75 mg/dL (ref 0–99)
NONHDL: 94.42
Total CHOL/HDL Ratio: 3
Triglycerides: 96 mg/dL (ref 0.0–149.0)
VLDL: 19.2 mg/dL (ref 0.0–40.0)

## 2016-04-27 LAB — MICROALBUMIN / CREATININE URINE RATIO
Creatinine,U: 143.9 mg/dL
MICROALB UR: 5.7 mg/dL — AB (ref 0.0–1.9)
MICROALB/CREAT RATIO: 4 mg/g (ref 0.0–30.0)

## 2016-04-27 LAB — HEPATIC FUNCTION PANEL
ALK PHOS: 69 U/L (ref 39–117)
ALT: 22 U/L (ref 0–53)
AST: 32 U/L (ref 0–37)
Albumin: 4.3 g/dL (ref 3.5–5.2)
BILIRUBIN TOTAL: 0.8 mg/dL (ref 0.2–1.2)
Bilirubin, Direct: 0.2 mg/dL (ref 0.0–0.3)
Total Protein: 7.3 g/dL (ref 6.0–8.3)

## 2016-04-27 LAB — POC URINALSYSI DIPSTICK (AUTOMATED)
Bilirubin, UA: NEGATIVE
Glucose, UA: NEGATIVE
Ketones, UA: NEGATIVE
Leukocytes, UA: NEGATIVE
NITRITE UA: NEGATIVE
SPEC GRAV UA: 1.015
UROBILINOGEN UA: 1
pH, UA: 6.5

## 2016-04-27 LAB — HEMOGLOBIN A1C: HEMOGLOBIN A1C: 5.4 % (ref 4.6–6.5)

## 2016-04-30 LAB — PSA: PSA: 2.01 ng/mL (ref 0.10–4.00)

## 2016-04-30 LAB — TSH: TSH: 0.78 u[IU]/mL (ref 0.35–4.50)

## 2016-05-01 ENCOUNTER — Encounter: Payer: Self-pay | Admitting: Internal Medicine

## 2016-05-01 ENCOUNTER — Ambulatory Visit (INDEPENDENT_AMBULATORY_CARE_PROVIDER_SITE_OTHER): Payer: BLUE CROSS/BLUE SHIELD | Admitting: Internal Medicine

## 2016-05-01 VITALS — BP 146/88 | HR 80 | Temp 98.1°F | Ht 68.0 in | Wt 191.6 lb

## 2016-05-01 DIAGNOSIS — Z0000 Encounter for general adult medical examination without abnormal findings: Secondary | ICD-10-CM

## 2016-05-01 DIAGNOSIS — Z23 Encounter for immunization: Secondary | ICD-10-CM

## 2016-05-01 MED ORDER — ASPIRIN 81 MG PO CHEW: 81.0000 mg | CHEWABLE_TABLET | Freq: Once | ORAL | Status: DC

## 2016-05-01 NOTE — Progress Notes (Signed)
Pre visit review using our clinic review tool, if applicable. No additional management support is needed unless otherwise documented below in the visit note. 

## 2016-05-01 NOTE — Patient Instructions (Addendum)
Limit your sodium (Salt) intake  Please check your blood pressure on a regular basis.  If it is consistently greater than 150/90, please make an office appointment.   Please check your hemoglobin A1c every 6  Months   aspirin 81 mg daily  Smoking tobacco is very bad for your health. You should stop smoking immediately.   consider low intensity chest CT for lung cancer screeningHealth Maintenance, Male A healthy lifestyle and preventative care can promote health and wellness.  Maintain regular health, dental, and eye exams.  Eat a healthy diet. Foods like vegetables, fruits, whole grains, low-fat dairy products, and lean protein foods contain the nutrients you need and are low in calories. Decrease your intake of foods high in solid fats, added sugars, and salt. Get information about a proper diet from your health care provider, if necessary.  Regular physical exercise is one of the most important things you can do for your health. Most adults should get at least 150 minutes of moderate-intensity exercise (any activity that increases your heart rate and causes you to sweat) each week. In addition, most adults need muscle-strengthening exercises on 2 or more days a week.   Maintain a healthy weight. The body mass index (BMI) is a screening tool to identify possible weight problems. It provides an estimate of body fat based on height and weight. Your health care provider can find your BMI and can help you achieve or maintain a healthy weight. For males 20 years and older:  A BMI below 18.5 is considered underweight.  A BMI of 18.5 to 24.9 is normal.  A BMI of 25 to 29.9 is considered overweight.  A BMI of 30 and above is considered obese.  Maintain normal blood lipids and cholesterol by exercising and minimizing your intake of saturated fat. Eat a balanced diet with plenty of fruits and vegetables. Blood tests for lipids and cholesterol should begin at age 71 and be repeated every 5 years.  If your lipid or cholesterol levels are high, you are over age 50, or you are at high risk for heart disease, you may need your cholesterol levels checked more frequently.Ongoing high lipid and cholesterol levels should be treated with medicines if diet and exercise are not working.  If you smoke, find out from your health care provider how to quit. If you do not use tobacco, do not start.  Lung cancer screening is recommended for adults aged 40-80 years who are at high risk for developing lung cancer because of a history of smoking. A yearly low-dose CT scan of the lungs is recommended for people who have at least a 30-pack-year history of smoking and are current smokers or have quit within the past 15 years. A pack year of smoking is smoking an average of 1 pack of cigarettes a day for 1 year (for example, a 30-pack-year history of smoking could mean smoking 1 pack a day for 30 years or 2 packs a day for 15 years). Yearly screening should continue until the smoker has stopped smoking for at least 15 years. Yearly screening should be stopped for people who develop a health problem that would prevent them from having lung cancer treatment.  If you choose to drink alcohol, do not have more than 2 drinks per day. One drink is considered to be 12 oz (360 mL) of beer, 5 oz (150 mL) of wine, or 1.5 oz (45 mL) of liquor.  Avoid the use of street drugs. Do not share needles  with anyone. Ask for help if you need support or instructions about stopping the use of drugs.  High blood pressure causes heart disease and increases the risk of stroke. High blood pressure is more likely to develop in:  People who have blood pressure in the end of the normal range (100-139/85-89 mm Hg).  People who are overweight or obese.  People who are African American.  If you are 21-33 years of age, have your blood pressure checked every 3-5 years. If you are 66 years of age or older, have your blood pressure checked every  year. You should have your blood pressure measured twice--once when you are at a hospital or clinic, and once when you are not at a hospital or clinic. Record the average of the two measurements. To check your blood pressure when you are not at a hospital or clinic, you can use:  An automated blood pressure machine at a pharmacy.  A home blood pressure monitor.  If you are 86-55 years old, ask your health care provider if you should take aspirin to prevent heart disease.  Diabetes screening involves taking a blood sample to check your fasting blood sugar level. This should be done once every 3 years after age 55 if you are at a normal weight and without risk factors for diabetes. Testing should be considered at a younger age or be carried out more frequently if you are overweight and have at least 1 risk factor for diabetes.  Colorectal cancer can be detected and often prevented. Most routine colorectal cancer screening begins at the age of 73 and continues through age 105. However, your health care provider may recommend screening at an earlier age if you have risk factors for colon cancer. On a yearly basis, your health care provider may provide home test kits to check for hidden blood in the stool. A small camera at the end of a tube may be used to directly examine the colon (sigmoidoscopy or colonoscopy) to detect the earliest forms of colorectal cancer. Talk to your health care provider about this at age 40 when routine screening begins. A direct exam of the colon should be repeated every 5-10 years through age 77, unless early forms of precancerous polyps or small growths are found.  People who are at an increased risk for hepatitis B should be screened for this virus. You are considered at high risk for hepatitis B if:  You were born in a country where hepatitis B occurs often. Talk with your health care provider about which countries are considered high risk.  Your parents were born in a  high-risk country and you have not received a shot to protect against hepatitis B (hepatitis B vaccine).  You have HIV or AIDS.  You use needles to inject street drugs.  You live with, or have sex with, someone who has hepatitis B.  You are a man who has sex with other men (MSM).  You get hemodialysis treatment.  You take certain medicines for conditions like cancer, organ transplantation, and autoimmune conditions.  Hepatitis C blood testing is recommended for all people born from [birth year] through 1963 and any individual with known risk factors for hepatitis C.  Healthy men should no longer receive prostate-specific antigen (PSA) blood tests as part of routine cancer screening. Talk to your health care provider about prostate cancer screening.  Testicular cancer screening is not recommended for adolescents or adult males who have no symptoms. Screening includes self-exam, a health care provider  exam, and other screening tests. Consult with your health care provider about any symptoms you have or any concerns you have about testicular cancer.  Practice safe sex. Use condoms and avoid high-risk sexual practices to reduce the spread of sexually transmitted infections (STIs).  You should be screened for STIs, including gonorrhea and chlamydia if:  You are sexually active and are younger than 24 years.  You are older than 24 years, and your health care provider tells you that you are at risk for this type of infection.  Your sexual activity has changed since you were last screened, and you are at an increased risk for chlamydia or gonorrhea. Ask your health care provider if you are at risk.  If you are at risk of being infected with HIV, it is recommended that you take a prescription medicine daily to prevent HIV infection. This is called pre-exposure prophylaxis (PrEP). You are considered at risk if:  You are a man who has sex with other men (MSM).  You are a heterosexual man who is  sexually active with multiple partners.  You take drugs by injection.  You are sexually active with a partner who has HIV.  Talk with your health care provider about whether you are at high risk of being infected with HIV. If you choose to begin PrEP, you should first be tested for HIV. You should then be tested every 3 months for as long as you are taking PrEP.  Use sunscreen. Apply sunscreen liberally and repeatedly throughout the day. You should seek shade when your shadow is shorter than you. Protect yourself by wearing long sleeves, pants, a wide-brimmed hat, and sunglasses year round whenever you are outdoors.  Tell your health care provider of new moles or changes in moles, especially if there is a change in shape or color. Also, tell your health care provider if a mole is larger than the size of a pencil eraser.  A one-time screening for abdominal aortic aneurysm (AAA) and surgical repair of large AAAs by ultrasound is recommended for men aged 71-75 years who are current or former smokers.  Stay current with your vaccines (immunizations).   This information is not intended to replace advice given to you by your health care provider. Make sure you discuss any questions you have with your health care provider.   Document Released: 12/15/2007 Document Revised: 07/09/2014 Document Reviewed: 11/13/2010 Elsevier Interactive Patient Education [employer].

## 2016-05-01 NOTE — Progress Notes (Signed)
Subjective:    Patient ID: Richard [name], male    DOB: 1950-10-08, 65 y.o.   MRN: [id number]  HPI 65   year-old patient who is in today for a wellness exam; he has multiple cardiac risk factors including hypertension dyslipidemia and type 2 diabetes. He has maintained nice glycemic control; he continues to smoke approximately two pack per day.   He was given a trial of Chantix last year, but this was not effective with smoking cessation He is now status post right olecranon bursectomy; he is had recent surgery involving his right wrist.  Due to significant hand arthritis, he has been disabled for the past 2 years. His smoking consumption is now up to 2 packs per day. and his requesting a trial of Chantix.  He has  Chronic cough and periodic wheezing. Exercise capacity is limited due to back and hip pain.  Denies any real DOE or symptoms of claudication   He had a colonoscopy approximately 11 months ago that revealed colonic polyps.  Laboratory studies reviewed  Alcohol-Tobacco  Smoking Status: current   Allergies (verified):  No Known Drug Allergies   Past History:  Past Medical History:   Diabetes mellitus, type II  GERD  Hyperlipidemia  Hypertension  Anemia-NOS  microalbuminuria  Macrocytosis  Tobacco abuse  Mild renal insufficiency  colonic polyps  Past Surgical History:   Right olecranon bursectomy Surgery right wrist colonoscopy 2004  06/2015  Family History:   father died of suicide death at age 72 history of early dementia  mother died age 65. History of chronic artery disease, status post prior MI, history of chronic kidney disease, status post pacemaker insertion  One brother one sister positive for diabetes - sister with advanced senile dementia of the Alzheimer's type   Social History:   Married  Insurance account manager   two to 3 mixed drinks nightlySmoking Status: current      Review of Systems  Constitutional: Negative for activity change, appetite  change, chills, fatigue and fever.  HENT: Negative for congestion, dental problem, ear pain, hearing loss, mouth sores, rhinorrhea, sinus pressure, sneezing, tinnitus, trouble swallowing and voice change.   Eyes: Negative for photophobia, pain, redness and visual disturbance.  Respiratory: Negative for apnea, cough, choking, chest tightness, shortness of breath and wheezing.   Cardiovascular: Negative for chest pain, palpitations and leg swelling.  Gastrointestinal: Negative for abdominal distention, abdominal pain, anal bleeding, blood in stool, constipation, diarrhea, nausea, rectal pain and vomiting.  Genitourinary: Negative for decreased urine volume, difficulty urinating, discharge, dysuria, flank pain, frequency, genital sores, hematuria, penile swelling, scrotal swelling, testicular pain and urgency.  Musculoskeletal: Negative for arthralgias, back pain, gait problem, joint swelling, myalgias, neck pain and neck stiffness.  Skin: Negative for color change, rash and wound.  Neurological: Positive for numbness (numbness of the toes of the left foot when walking). Negative for dizziness, tremors, seizures, syncope, facial asymmetry, speech difficulty, weakness, light-headedness and headaches.  Hematological: Negative for adenopathy. Does not bruise/bleed easily.  Psychiatric/Behavioral: Negative for agitation, behavioral problems, confusion, decreased concentration, dysphoric mood, hallucinations, self-injury, sleep disturbance and suicidal ideas. The patient is not nervous/anxious.        Objective:   Physical Exam  Constitutional: He appears well-developed and well-nourished.  HENT:  Head: Normocephalic and atraumatic.  Right Ear: External ear normal.  Left Ear: External ear normal.  Nose: Nose normal.  Mouth/Throat: Oropharynx is clear and moist.  Eyes: Conjunctivae and EOM are normal. Pupils are equal, round, and  reactive to light. No scleral icterus.  Neck: Normal range of motion.  Neck supple. No JVD present. No thyromegaly present.  Cardiovascular: Regular rhythm and normal heart sounds.  Exam reveals no gallop and no friction rub.   No murmur heard. Right posterior tibial pulse full.  Other peripheral pulses not easily palpable  Pulmonary/Chest: Effort normal and breath sounds normal. He exhibits no tenderness.  Abdominal: Soft. Bowel sounds are normal. He exhibits no distension and no mass. There is no tenderness.  Genitourinary: Prostate normal and penis normal. Rectal exam shows guaiac negative stool.  Musculoskeletal: Normal range of motion. He exhibits no edema or tenderness.  Lymphadenopathy:    He has no cervical adenopathy.  Neurological: He is alert. He has normal reflexes. No cranial nerve deficit. Coordination normal.  Skin: Skin is warm and dry. No rash noted.  Psychiatric: He has a normal mood and affect. His behavior is normal.          Assessment & Plan:   Preventive health examination Hypertension well controlled Dyslipidemia Tobacco abuse.   Total smoking cessation encouraged  P A., D. Diabetes mellitus.  Very tight control on metformin therapy Chronic kidney disease.  Stable Familial tremor Toenail onychomycosis  Colonic polyps.  Schedule followup colonoscopy  In 4 years  Recheck 6 months  [id number],PETER [name]

## 2016-06-22 ENCOUNTER — Other Ambulatory Visit: Payer: BLUE CROSS/BLUE SHIELD

## 2016-06-26 ENCOUNTER — Encounter: Payer: BLUE CROSS/BLUE SHIELD | Admitting: Internal Medicine

## 2016-07-23 ENCOUNTER — Other Ambulatory Visit: Payer: Self-pay | Admitting: Internal Medicine

## 2016-09-21 ENCOUNTER — Other Ambulatory Visit: Payer: Self-pay | Admitting: Internal Medicine

## 2016-09-28 ENCOUNTER — Other Ambulatory Visit: Payer: Self-pay | Admitting: Internal Medicine

## 2016-10-21 ENCOUNTER — Other Ambulatory Visit: Payer: Self-pay | Admitting: Internal Medicine

## 2016-10-29 ENCOUNTER — Encounter: Payer: Self-pay | Admitting: Internal Medicine

## 2016-10-29 ENCOUNTER — Ambulatory Visit (INDEPENDENT_AMBULATORY_CARE_PROVIDER_SITE_OTHER): Payer: PPO | Admitting: Internal Medicine

## 2016-10-29 VITALS — BP 148/88 | HR 76 | Temp 98.1°F | Wt 209.0 lb

## 2016-10-29 DIAGNOSIS — E119 Type 2 diabetes mellitus without complications: Secondary | ICD-10-CM

## 2016-10-29 DIAGNOSIS — I739 Peripheral vascular disease, unspecified: Secondary | ICD-10-CM

## 2016-10-29 DIAGNOSIS — E785 Hyperlipidemia, unspecified: Secondary | ICD-10-CM

## 2016-10-29 DIAGNOSIS — M48062 Spinal stenosis, lumbar region with neurogenic claudication: Secondary | ICD-10-CM

## 2016-10-29 DIAGNOSIS — I10 Essential (primary) hypertension: Secondary | ICD-10-CM

## 2016-10-29 DIAGNOSIS — N182 Chronic kidney disease, stage 2 (mild): Secondary | ICD-10-CM

## 2016-10-29 DIAGNOSIS — E0821 Diabetes mellitus due to underlying condition with diabetic nephropathy: Secondary | ICD-10-CM

## 2016-10-29 LAB — HEMOGLOBIN A1C: HEMOGLOBIN A1C: 5.9 % (ref 4.6–6.5)

## 2016-10-29 MED ORDER — ATORVASTATIN CALCIUM 40 MG PO TABS: 40.0000 mg | ORAL_TABLET | Freq: Every day | ORAL | 3 refills | Status: DC

## 2016-10-29 MED ORDER — AMLODIPINE BESYLATE 5 MG PO TABS: 5.0000 mg | ORAL_TABLET | Freq: Every day | ORAL | 3 refills | Status: DC

## 2016-10-29 MED ORDER — AMLODIPINE-ATORVASTATIN 5-80 MG PO TABS: 1.0000 | ORAL_TABLET | Freq: Every day | ORAL | 3 refills | Status: DC

## 2016-10-29 NOTE — Progress Notes (Signed)
Pre visit review using our clinic review tool, if applicable. No additional management support is needed unless otherwise documented below in the visit note. 

## 2016-10-29 NOTE — Progress Notes (Signed)
Subjective:    Patient ID: Richard [name], male    DOB: 1950-09-12, 66 y.o.   MRN: [id number]  HPI  Lab Results  Component Value Date   HGBA1C 5.4 04/27/2016    BP Readings from Last 3 Encounters:  10/29/16 (!) 148/88  05/01/16 (!) 146/88  10/12/15 (!) 43/53    66 year old patient who is seen today for follow-up of hypertension, type 2 diabetes and dyslipidemia He has a long history of spinal stenosis.  He is complaining of worsening low back pain referred to the hips and legs with walking.  This has interfered with his daily activities  Discontinued tobacco 5 months ago  Past Medical History:  Diagnosis Date  . ANEMIA-NOS 12/06/2008  . BACK PAIN 02/20/2010  . DIABETES MELLITUS, TYPE II 06/01/2008  . GERD 06/01/2008  . HYPERLIPIDEMIA 06/01/2008  . HYPERTENSION 06/01/2008  . Tremors of nervous system    pt states he has had tremors most of his life in his hands/hereditary     Social History   Social History  . Marital status: Married    Spouse name: N/A  . Number of children: N/A  . Years of education: N/A   Occupational History  . Not on file.   Social History Main Topics  . Smoking status: Current Every Day Smoker    Packs/day: 2.00    Types: Cigarettes  . Smokeless tobacco: Never Used  . Alcohol use 1.2 - 1.8 oz/week    2 - 3 Standard drinks or equivalent per week  . Drug use: No  . Sexual activity: Not on file   Other Topics Concern  . Not on file   Social History Narrative  . No narrative on file    Past Surgical History:  Procedure Laterality Date  . HAND SURGERY     3 surgeries on right hand    Family History  Problem Relation Age of Onset  . Diabetes Sister   . Diabetes Brother   . Colon cancer Neg Hx     No Known Allergies  Current Outpatient Prescriptions on File Prior to Visit  Medication Sig Dispense Refill  . lisinopril (PRINIVIL,ZESTRIL) 40 MG tablet TAKE 1 TABLET BY MOUTH EVERY DAY 90 tablet 0  . metFORMIN (GLUCOPHAGE) 500 MG  tablet TAKE 1 TABLET BY MOUTH DAILY WITH BREAKFAST 90 tablet 0  . omeprazole (PRILOSEC) 20 MG capsule TAKE 1 CAPSULE BY MOUTH DAILY 90 capsule 0  . simvastatin (ZOCOR) 40 MG tablet TAKE 1 TABLET BY MOUTH EVERY DAY 90 tablet 0  . ACCU-CHEK AVIVA test strip USE ONCE DAILY AND AS NEEDED (Patient not taking: Reported on 10/29/2016) 100 each 3  . Lancets (ACCU-CHEK MULTICLIX) lancets 1 each by Other route daily. Use as instructed      Current Facility-Administered Medications on File Prior to Visit  Medication Dose Route Frequency Provider Last Rate Last Dose  . aspirin chewable tablet 81 mg  81 mg Oral Once Marletta Lor, MD        BP (!) 148/88 (BP Location: Left Arm, Patient Position: Sitting, Cuff Size: Normal)   Pulse 76   Temp 98.1 F (36.7 C) (Oral)   Wt 209 lb (94.8 kg)   SpO2 99%   BMI 31.78 kg/m     Review of Systems  Constitutional: Negative for appetite change, chills, fatigue and fever.  HENT: Negative for congestion, dental problem, ear pain, hearing loss, sore throat, tinnitus, trouble swallowing and voice change.   Eyes: Negative for  pain, discharge and visual disturbance.  Respiratory: Negative for cough, chest tightness, wheezing and stridor.   Cardiovascular: Negative for chest pain, palpitations and leg swelling.  Gastrointestinal: Negative for abdominal distention, abdominal pain, blood in stool, constipation, diarrhea, nausea and vomiting.  Genitourinary: Negative for difficulty urinating, discharge, flank pain, genital sores, hematuria and urgency.  Musculoskeletal: Positive for back pain. Negative for arthralgias, gait problem, joint swelling, myalgias and neck stiffness.       Back and  hip and leg pain with walking  Skin: Negative for rash.  Neurological: Negative for dizziness, syncope, speech difficulty, weakness, numbness and headaches.  Hematological: Negative for adenopathy. Does not bruise/bleed easily.  Psychiatric/Behavioral: Negative for  behavioral problems and dysphoric mood. The patient is not nervous/anxious.        Objective:   Physical Exam  Constitutional: He is oriented to person, place, and time. He appears well-developed.  Blood pressure 160/90  HENT:  Head: Normocephalic.  Right Ear: External ear normal.  Left Ear: External ear normal.  Eyes: Conjunctivae and EOM are normal.  Neck: Normal range of motion.  Cardiovascular: Normal rate and normal heart sounds.   Pedal pulses decreased on the left  Pulmonary/Chest: Breath sounds normal.  Abdominal: Bowel sounds are normal.  Musculoskeletal: Normal range of motion. He exhibits no edema or tenderness.  Neurological: He is alert and oriented to person, place, and time.  Psychiatric: He has a normal mood and affect. His behavior is normal.          Assessment & Plan:   Lumbar spinal stenosis.  Orthopedic referral Hypertension.  Suboptimal control.  Will add amlodipine 5 Diabetes mellitus.  Check hemoglobin A1c.  Annual eye examination encouraged PAD.  Will schedule arterial Doppler studies  CPX 3 months  Richard [name] [name]

## 2016-10-29 NOTE — Patient Instructions (Addendum)
WE NOW OFFER   Richard [name]'s FAST TRACK!!!  SAME DAY Appointments for ACUTE CARE  Such as: Sprains, Injuries, cuts, abrasions, rashes, muscle pain, joint pain, back pain Colds, flu, sore throats, headache, allergies, cough, fever  Ear pain, sinus and eye infections Abdominal pain, nausea, vomiting, diarrhea, upset stomach Animal/insect bites  3 Easy Ways to Schedule: Walk-In Scheduling Call in scheduling Mychart Sign-up: [email or web address]    Please check your hemoglobin A1c every 3 months  Limit your sodium (Salt) intake  Please check your blood pressure on a regular basis.  If it is consistently greater than 150/90, please make an office appointment.

## 2016-10-31 DIAGNOSIS — F1010 Alcohol abuse, uncomplicated: Secondary | ICD-10-CM | POA: Insufficient documentation

## 2016-11-08 ENCOUNTER — Other Ambulatory Visit: Payer: Self-pay | Admitting: Internal Medicine

## 2016-11-08 ENCOUNTER — Ambulatory Visit (HOSPITAL_COMMUNITY)
Admission: RE | Admit: 2016-11-08 | Discharge: 2016-11-08 | Disposition: A | Discharge status: Home / Self Care | Payer: PPO | Source: Ambulatory Visit | Attending: Vascular Surgery | Admitting: Vascular Surgery

## 2016-11-08 DIAGNOSIS — I739 Peripheral vascular disease, unspecified: Secondary | ICD-10-CM | POA: Insufficient documentation

## 2016-11-19 DIAGNOSIS — M48062 Spinal stenosis, lumbar region with neurogenic claudication: Secondary | ICD-10-CM | POA: Diagnosis not present

## 2016-11-28 ENCOUNTER — Encounter: Payer: Self-pay | Admitting: Internal Medicine

## 2016-11-28 ENCOUNTER — Ambulatory Visit (INDEPENDENT_AMBULATORY_CARE_PROVIDER_SITE_OTHER): Payer: PPO | Admitting: Internal Medicine

## 2016-11-28 VITALS — BP 136/76 | HR 90 | Temp 98.1°F | Ht 68.0 in | Wt 215.0 lb

## 2016-11-28 DIAGNOSIS — E0821 Diabetes mellitus due to underlying condition with diabetic nephropathy: Secondary | ICD-10-CM

## 2016-11-28 DIAGNOSIS — I10 Essential (primary) hypertension: Secondary | ICD-10-CM

## 2016-11-28 DIAGNOSIS — S39012A Strain of muscle, fascia and tendon of lower back, initial encounter: Secondary | ICD-10-CM | POA: Diagnosis not present

## 2016-11-28 NOTE — Progress Notes (Signed)
Subjective:    Patient ID: Richard [name], male    DOB: 01/17/1951, 66 y.o.   MRN: [id number]  HPI  66 year old patient who is seen today for follow-up of hypertension.  Amlodipine 5 mg daily was added to his regimen.  1 month ago.  He has tolerated this medication well.  Home blood pressure monitoring reveals normal readings He continues to have significant the lumbar and leg pain.  He is scheduled for follow-up.  Lumbar MRI later today He has type 2 diabetes.  Last hemoglobin A1c 1 month ago was 5.9  Past Medical History:  Diagnosis Date  . ANEMIA-NOS 12/06/2008  . BACK PAIN 02/20/2010  . DIABETES MELLITUS, TYPE II 06/01/2008  . GERD 06/01/2008  . HYPERLIPIDEMIA 06/01/2008  . HYPERTENSION 06/01/2008  . Tremors of nervous system    pt states he has had tremors most of his life in his hands/hereditary     Social History   Social History  . Marital status: Married    Spouse name: N/A  . Number of children: N/A  . Years of education: N/A   Occupational History  . Not on file.   Social History Main Topics  . Smoking status: Current Every Day Smoker    Packs/day: 2.00    Types: Cigarettes  . Smokeless tobacco: Never Used  . Alcohol use 1.2 - 1.8 oz/week    2 - 3 Standard drinks or equivalent per week  . Drug use: No  . Sexual activity: Not on file   Other Topics Concern  . Not on file   Social History Narrative  . No narrative on file    Past Surgical History:  Procedure Laterality Date  . HAND SURGERY     3 surgeries on right hand    Family History  Problem Relation Age of Onset  . Diabetes Sister   . Diabetes Brother   . Colon cancer Neg Hx     No Known Allergies  Current Outpatient Prescriptions on File Prior to Visit  Medication Sig Dispense Refill  . amLODipine (NORVASC) 5 MG tablet Take 1 tablet (5 mg total) by mouth daily. 90 tablet 3  . atorvastatin (LIPITOR) 40 MG tablet Take 1 tablet (40 mg total) by mouth daily. 90 tablet 3  . Lancets (ACCU-CHEK  MULTICLIX) lancets 1 each by Other route daily. Use as instructed     . lisinopril (PRINIVIL,ZESTRIL) 40 MG tablet TAKE 1 TABLET BY MOUTH EVERY DAY 90 tablet 0  . metFORMIN (GLUCOPHAGE) 500 MG tablet TAKE 1 TABLET BY MOUTH DAILY WITH BREAKFAST 90 tablet 0  . omeprazole (PRILOSEC) 20 MG capsule TAKE 1 CAPSULE BY MOUTH DAILY 90 capsule 0  . ACCU-CHEK AVIVA test strip USE ONCE DAILY AND AS NEEDED (Patient not taking: Reported on 10/29/2016) 100 each 3   Current Facility-Administered Medications on File Prior to Visit  Medication Dose Route Frequency Provider Last Rate Last Dose  . aspirin chewable tablet 81 mg  81 mg Oral Once Marletta Lor, MD        BP 136/76 (BP Location: Left Arm, Patient Position: Sitting, Cuff Size: Normal)   Pulse 90   Temp 98.1 F (36.7 C) (Oral)   Ht 5\' 8"  (1.727 m)   Wt 215 lb (97.5 kg)   SpO2 99%   BMI 32.69 kg/m     Review of Systems  Constitutional: Negative for appetite change, chills, fatigue and fever.  HENT: Negative for congestion, dental problem, ear pain, hearing loss, sore  throat, tinnitus, trouble swallowing and voice change.   Eyes: Negative for pain, discharge and visual disturbance.  Respiratory: Negative for cough, chest tightness, wheezing and stridor.   Cardiovascular: Negative for chest pain, palpitations and leg swelling.  Gastrointestinal: Negative for abdominal distention, abdominal pain, blood in stool, constipation, diarrhea, nausea and vomiting.  Genitourinary: Negative for difficulty urinating, discharge, flank pain, genital sores, hematuria and urgency.  Musculoskeletal: Positive for back pain. Negative for arthralgias, gait problem, joint swelling, myalgias and neck stiffness.  Skin: Negative for rash.  Neurological: Negative for dizziness, syncope, speech difficulty, weakness, numbness and headaches.  Hematological: Negative for adenopathy. Does not bruise/bleed easily.  Psychiatric/Behavioral: Negative for behavioral  problems and dysphoric mood. The patient is not nervous/anxious.        Objective:   Physical Exam  Constitutional: He appears well-developed and well-nourished. No distress.  Blood pressure 130/80  Psychiatric: He has a normal mood and affect. His behavior is normal. Thought content normal.          Assessment & Plan:  Hypertension, well-controlled Diabetes mellitus.  Excellent control Lumbar spinal stenosis.  Follow-up MRI today  Recheck here 6 months or as needed  [employer]

## 2016-11-28 NOTE — Patient Instructions (Addendum)
WE NOW OFFER   Richard [name]'s FAST TRACK!!!  SAME DAY Appointments for ACUTE CARE  Such as: Sprains, Injuries, cuts, abrasions, rashes, muscle pain, joint pain, back pain Colds, flu, sore throats, headache, allergies, cough, fever  Ear pain, sinus and eye infections Abdominal pain, nausea, vomiting, diarrhea, upset stomach Animal/insect bites  3 Easy Ways to Schedule: Walk-In Scheduling Call in scheduling Mychart Sign-up: [email or web address]    Limit your sodium (Salt) intake  Please check your blood pressure on a regular basis.  If it is consistently greater than 150/90, please make an office appointment.  Return in 6 months for follow-up

## 2016-12-05 DIAGNOSIS — M48062 Spinal stenosis, lumbar region with neurogenic claudication: Secondary | ICD-10-CM | POA: Diagnosis not present

## 2016-12-06 ENCOUNTER — Other Ambulatory Visit: Payer: Self-pay | Admitting: Orthopedic Surgery

## 2016-12-06 DIAGNOSIS — M48062 Spinal stenosis, lumbar region with neurogenic claudication: Secondary | ICD-10-CM

## 2016-12-10 ENCOUNTER — Ambulatory Visit
Admission: RE | Admit: 2016-12-10 | Discharge: 2016-12-10 | Disposition: A | Discharge status: Home / Self Care | Payer: PPO | Source: Ambulatory Visit | Attending: Orthopedic Surgery | Admitting: Orthopedic Surgery

## 2016-12-10 ENCOUNTER — Other Ambulatory Visit: Payer: Self-pay | Admitting: Orthopedic Surgery

## 2016-12-10 ENCOUNTER — Ambulatory Visit
Admission: RE | Admit: 2016-12-10 | Discharge: 2016-12-10 | Disposition: A | Discharge status: Home / Self Care | Payer: Self-pay | Source: Ambulatory Visit | Attending: Orthopedic Surgery | Admitting: Orthopedic Surgery

## 2016-12-10 DIAGNOSIS — M48062 Spinal stenosis, lumbar region with neurogenic claudication: Secondary | ICD-10-CM

## 2016-12-10 DIAGNOSIS — M48061 Spinal stenosis, lumbar region without neurogenic claudication: Secondary | ICD-10-CM | POA: Diagnosis not present

## 2016-12-10 MED ORDER — IOPAMIDOL (ISOVUE-M 200) INJECTION 41%
15.0000 mL | Freq: Once | INTRAMUSCULAR | Status: AC
  Administered 2016-12-10: 15 mL via INTRATHECAL

## 2016-12-10 MED ORDER — ONDANSETRON HCL 4 MG/2ML IJ SOLN: 4.0000 mg | Freq: Four times a day (QID) | INTRAMUSCULAR | Status: DC | PRN

## 2016-12-10 MED ORDER — ONDANSETRON HCL 4 MG/2ML IJ SOLN
4.0000 mg | Freq: Once | INTRAMUSCULAR | Status: AC
  Administered 2016-12-10: 4 mg via INTRAMUSCULAR

## 2016-12-10 MED ORDER — MEPERIDINE HCL 100 MG/ML IJ SOLN
75.0000 mg | Freq: Once | INTRAMUSCULAR | Status: AC
  Administered 2016-12-10: 75 mg via INTRAMUSCULAR

## 2016-12-10 MED ORDER — DIAZEPAM 5 MG PO TABS
10.0000 mg | ORAL_TABLET | Freq: Once | ORAL | Status: AC
  Administered 2016-12-10: 10 mg via ORAL

## 2016-12-10 NOTE — Discharge Instructions (Signed)

## 2016-12-18 DIAGNOSIS — M48062 Spinal stenosis, lumbar region with neurogenic claudication: Secondary | ICD-10-CM | POA: Diagnosis not present

## 2016-12-18 DIAGNOSIS — S39012D Strain of muscle, fascia and tendon of lower back, subsequent encounter: Secondary | ICD-10-CM | POA: Diagnosis not present

## 2016-12-27 ENCOUNTER — Other Ambulatory Visit: Payer: Self-pay | Admitting: Internal Medicine

## 2017-01-01 DIAGNOSIS — S39012D Strain of muscle, fascia and tendon of lower back, subsequent encounter: Secondary | ICD-10-CM | POA: Diagnosis not present

## 2017-01-01 DIAGNOSIS — M48062 Spinal stenosis, lumbar region with neurogenic claudication: Secondary | ICD-10-CM | POA: Diagnosis not present

## 2017-01-04 ENCOUNTER — Other Ambulatory Visit: Payer: Self-pay | Admitting: Internal Medicine

## 2017-01-04 ENCOUNTER — Other Ambulatory Visit: Payer: Self-pay

## 2017-01-05 NOTE — Patient Outreach (Signed)
   January 04, 2017  Unsuccessful attempt made to contact patient for HTA Screening. Will make another attempt in the next 21 days.

## 2017-01-07 ENCOUNTER — Other Ambulatory Visit: Payer: Self-pay

## 2017-01-07 NOTE — Patient Outreach (Signed)
    Third unsuccessful attempt made to telephone contact with patient for this HTA Screening call. Will send [place] information for patient use if needed in the future.

## 2017-02-05 ENCOUNTER — Telehealth: Payer: Self-pay | Admitting: Internal Medicine

## 2017-02-05 NOTE — Telephone Encounter (Signed)
Blood pressure too high on lisinopril alone.  2.  Drugs  required for adequate blood pressure control

## 2017-02-05 NOTE — Telephone Encounter (Signed)
Pts wife is calling to see why the pt is taking two Bp medications Amlodipine and lisinopril.  They would like to have a call back to explain why please.

## 2017-02-05 NOTE — Telephone Encounter (Signed)
Please advise 

## 2017-02-06 NOTE — Telephone Encounter (Signed)
Pt made aware and verbalized understanding.

## 2017-02-26 DIAGNOSIS — M48062 Spinal stenosis, lumbar region with neurogenic claudication: Secondary | ICD-10-CM | POA: Diagnosis not present

## 2017-03-12 DIAGNOSIS — M48062 Spinal stenosis, lumbar region with neurogenic claudication: Secondary | ICD-10-CM | POA: Diagnosis not present

## 2017-03-21 DIAGNOSIS — M48062 Spinal stenosis, lumbar region with neurogenic claudication: Secondary | ICD-10-CM | POA: Diagnosis not present

## 2017-03-21 DIAGNOSIS — M48061 Spinal stenosis, lumbar region without neurogenic claudication: Secondary | ICD-10-CM | POA: Diagnosis not present

## 2017-03-21 DIAGNOSIS — M47816 Spondylosis without myelopathy or radiculopathy, lumbar region: Secondary | ICD-10-CM | POA: Diagnosis not present

## 2017-03-26 ENCOUNTER — Other Ambulatory Visit: Payer: Self-pay | Admitting: Internal Medicine

## 2017-04-18 DIAGNOSIS — M48062 Spinal stenosis, lumbar region with neurogenic claudication: Secondary | ICD-10-CM | POA: Diagnosis not present

## 2017-04-18 DIAGNOSIS — M47816 Spondylosis without myelopathy or radiculopathy, lumbar region: Secondary | ICD-10-CM | POA: Diagnosis not present

## 2017-04-30 DIAGNOSIS — M48062 Spinal stenosis, lumbar region with neurogenic claudication: Secondary | ICD-10-CM | POA: Diagnosis not present

## 2017-05-09 DIAGNOSIS — M48062 Spinal stenosis, lumbar region with neurogenic claudication: Secondary | ICD-10-CM | POA: Diagnosis not present

## 2017-05-14 DIAGNOSIS — M48062 Spinal stenosis, lumbar region with neurogenic claudication: Secondary | ICD-10-CM | POA: Diagnosis not present

## 2017-05-17 DIAGNOSIS — M48062 Spinal stenosis, lumbar region with neurogenic claudication: Secondary | ICD-10-CM | POA: Diagnosis not present

## 2017-05-21 DIAGNOSIS — M48062 Spinal stenosis, lumbar region with neurogenic claudication: Secondary | ICD-10-CM | POA: Diagnosis not present

## 2017-05-30 ENCOUNTER — Encounter: Payer: Self-pay | Admitting: Internal Medicine

## 2017-05-30 ENCOUNTER — Ambulatory Visit: Payer: PPO | Admitting: Internal Medicine

## 2017-05-30 ENCOUNTER — Other Ambulatory Visit: Payer: Self-pay | Admitting: Internal Medicine

## 2017-05-30 VITALS — BP 138/70 | HR 85 | Temp 98.0°F | Ht 68.0 in | Wt 202.8 lb

## 2017-05-30 DIAGNOSIS — I10 Essential (primary) hypertension: Secondary | ICD-10-CM

## 2017-05-30 DIAGNOSIS — R5383 Other fatigue: Secondary | ICD-10-CM

## 2017-05-30 DIAGNOSIS — E119 Type 2 diabetes mellitus without complications: Secondary | ICD-10-CM

## 2017-05-30 DIAGNOSIS — E0821 Diabetes mellitus due to underlying condition with diabetic nephropathy: Secondary | ICD-10-CM

## 2017-05-30 DIAGNOSIS — Z0000 Encounter for general adult medical examination without abnormal findings: Secondary | ICD-10-CM

## 2017-05-30 DIAGNOSIS — Z23 Encounter for immunization: Secondary | ICD-10-CM | POA: Diagnosis not present

## 2017-05-30 DIAGNOSIS — E785 Hyperlipidemia, unspecified: Secondary | ICD-10-CM | POA: Diagnosis not present

## 2017-05-30 LAB — CBC WITH DIFFERENTIAL/PLATELET
BASOS PCT: 0.8 % (ref 0.0–3.0)
Basophils Absolute: 0 10*3/uL (ref 0.0–0.1)
EOS PCT: 6.6 % — AB (ref 0.0–5.0)
Eosinophils Absolute: 0.3 10*3/uL (ref 0.0–0.7)
HEMATOCRIT: 46.1 % (ref 39.0–52.0)
HEMOGLOBIN: 15.8 g/dL (ref 13.0–17.0)
LYMPHS PCT: 35.9 % (ref 12.0–46.0)
Lymphs Abs: 1.6 10*3/uL (ref 0.7–4.0)
MCHC: 34.2 g/dL (ref 30.0–36.0)
MCV: 108.4 fl — AB (ref 78.0–100.0)
Monocytes Absolute: 0.5 10*3/uL (ref 0.1–1.0)
Monocytes Relative: 10 % (ref 3.0–12.0)
Neutro Abs: 2.1 10*3/uL (ref 1.4–7.7)
Neutrophils Relative %: 46.7 % (ref 43.0–77.0)
PLATELETS: 175 10*3/uL (ref 150.0–400.0)
RBC: 4.25 Mil/uL (ref 4.22–5.81)
RDW: 12.8 % (ref 11.5–15.5)
WBC: 4.5 10*3/uL (ref 4.0–10.5)

## 2017-05-30 LAB — COMPREHENSIVE METABOLIC PANEL
ALBUMIN: 4 g/dL (ref 3.5–5.2)
ALT: 27 U/L (ref 0–53)
AST: 29 U/L (ref 0–37)
Alkaline Phosphatase: 86 U/L (ref 39–117)
BUN: 14 mg/dL (ref 6–23)
CALCIUM: 9.5 mg/dL (ref 8.4–10.5)
CHLORIDE: 98 meq/L (ref 96–112)
CO2: 29 mEq/L (ref 19–32)
Creatinine, Ser: 1.64 mg/dL — ABNORMAL HIGH (ref 0.40–1.50)
GFR: 45.18 mL/min — ABNORMAL LOW (ref >60.00)
Glucose, Bld: 136 mg/dL — ABNORMAL HIGH (ref 70–99)
POTASSIUM: 4 meq/L (ref 3.5–5.1)
SODIUM: 136 meq/L (ref 135–145)
Total Bilirubin: 0.5 mg/dL (ref 0.2–1.2)
Total Protein: 6.8 g/dL (ref 6.0–8.3)

## 2017-05-30 LAB — PSA: PSA: 1.3 ng/mL (ref 0.10–4.00)

## 2017-05-30 LAB — MICROALBUMIN / CREATININE URINE RATIO
Creatinine,U: 263.9 mg/dL
Microalb Creat Ratio: 2 mg/g (ref 0.0–30.0)
Microalb, Ur: 5.3 mg/dL — ABNORMAL HIGH (ref 0.0–1.9)

## 2017-05-30 LAB — LIPID PANEL
CHOLESTEROL: 126 mg/dL (ref 0–200)
HDL: 41.6 mg/dL (ref >39.00)
LDL CALC: 72 mg/dL (ref 0–99)
NonHDL: 84.09
TRIGLYCERIDES: 61 mg/dL (ref 0.0–149.0)
Total CHOL/HDL Ratio: 3
VLDL: 12.2 mg/dL (ref 0.0–40.0)

## 2017-05-30 LAB — POCT GLYCOSYLATED HEMOGLOBIN (HGB A1C): Hemoglobin A1C: 5.9

## 2017-05-30 LAB — TSH: TSH: 0.78 u[IU]/mL (ref 0.35–4.50)

## 2017-05-30 NOTE — Progress Notes (Signed)
Subjective:    Patient ID: Richard [name], male    DOB: [birth year], 66 y.o.   MRN: [id number]  HPI Lab Results  Component Value Date   HGBA1C 5.9 10/29/2016   66 year old patient who is seen today for his 31-month follow-up He has been disabled for about 3 years due to orthopedic issues He has type 2 diabetes which has been under excellent control with metformin therapy only.  Hemoglobin A1c today 5.9.  He has essential hypertension and dyslipidemia.  Unfortunately he has resumed tobacco use again He does have some occasional nocturnal wheezing.  No exertional chest pain. He continues to have low back pain secondary to lumbar spinal stenosis.  He presently is undergoing physical therapy and has had some epidurals earlier in the year.  He is attempting to avoid surgery  Past Medical History:  Diagnosis Date  . ANEMIA-NOS 12/06/2008  . BACK PAIN 02/20/2010  . DIABETES MELLITUS, TYPE II 06/01/2008  . GERD 06/01/2008  . HYPERLIPIDEMIA 06/01/2008  . HYPERTENSION 06/01/2008  . Tremors of nervous system    pt states he has had tremors most of his life in his hands/hereditary     Social History   Socioeconomic History  . Marital status: Married    Spouse name: Not on file  . Number of children: Not on file  . Years of education: Not on file  . Highest education level: Not on file  Social Needs  . Financial resource strain: Not on file  . Food insecurity - worry: Not on file  . Food insecurity - inability: Not on file  . Transportation needs - medical: Not on file  . Transportation needs - non-medical: Not on file  Occupational History  . Not on file  Tobacco Use  . Smoking status: Current Every Day Smoker    Packs/day: 2.00    Types: Cigarettes  . Smokeless tobacco: Never Used  Substance and Sexual Activity  . Alcohol use: Yes    Alcohol/week: 1.2 - 1.8 oz    Types: 2 - 3 Standard drinks or equivalent per week  . Drug use: No  . Sexual activity: Not on file  Other Topics  Concern  . Not on file  Social History Narrative  . Not on file    Past Surgical History:  Procedure Laterality Date  . HAND SURGERY     3 surgeries on right hand    Family History  Problem Relation Age of Onset  . Diabetes Sister   . Diabetes Brother   . Colon cancer Neg Hx     No Known Allergies  Current Outpatient Medications on File Prior to Visit  Medication Sig Dispense Refill  . ACCU-CHEK AVIVA test strip USE ONCE DAILY AND AS NEEDED 100 each 3  . amLODipine (NORVASC) 5 MG tablet Take 1 tablet (5 mg total) by mouth daily. 90 tablet 3  . atorvastatin (LIPITOR) 40 MG tablet Take 1 tablet (40 mg total) by mouth daily. 90 tablet 3  . Lancets (ACCU-CHEK MULTICLIX) lancets 1 each by Other route daily. Use as instructed     . lisinopril (PRINIVIL,ZESTRIL) 40 MG tablet TAKE 1 TABLET BY MOUTH EVERY DAY 90 tablet 1  . metFORMIN (GLUCOPHAGE) 500 MG tablet TAKE 1 TABLET BY MOUTH DAILY WITH BREAKFAST 90 tablet 0  . omeprazole (PRILOSEC) 20 MG capsule TAKE 1 CAPSULE BY MOUTH DAILY 90 capsule 1  . simvastatin (ZOCOR) 40 MG tablet TAKE 1 TABLET BY MOUTH EVERY DAY 90 tablet 0  Current Facility-Administered Medications on File Prior to Visit  Medication Dose Route Frequency Provider Last Rate Last Dose  . aspirin chewable tablet 81 mg  81 mg Oral Once Marletta Lor, MD        BP 138/70 (BP Location: Left Arm, Patient Position: Sitting, Cuff Size: Normal)   Pulse 85   Temp 98 F (36.7 C) (Oral)   Ht 5\' 8"  (1.727 m)   Wt 202 lb 12.8 oz (92 kg)   SpO2 98%   BMI 30.84 kg/m     Review of Systems  Constitutional: Negative for appetite change, chills, fatigue and fever.  HENT: Negative for congestion, dental problem, ear pain, hearing loss, sore throat, tinnitus, trouble swallowing and voice change.   Eyes: Negative for pain, discharge and visual disturbance.  Respiratory: Negative for cough, chest tightness, wheezing and stridor.   Cardiovascular: Negative for chest pain,  palpitations and leg swelling.  Gastrointestinal: Negative for abdominal distention, abdominal pain, blood in stool, constipation, diarrhea, nausea and vomiting.  Genitourinary: Negative for difficulty urinating, discharge, flank pain, genital sores, hematuria and urgency.  Musculoskeletal: Positive for arthralgias and back pain. Negative for gait problem, joint swelling, myalgias and neck stiffness.  Skin: Negative for rash.  Neurological: Negative for dizziness, syncope, speech difficulty, weakness, numbness and headaches.  Hematological: Negative for adenopathy. Does not bruise/bleed easily.  Psychiatric/Behavioral: Negative for behavioral problems and dysphoric mood. The patient is not nervous/anxious.        Objective:   Physical Exam  Constitutional: He is oriented to person, place, and time. He appears well-developed.  HENT:  Head: Normocephalic.  Right Ear: External ear normal.  Left Ear: External ear normal.  Eyes: Conjunctivae and EOM are normal.  Neck: Normal range of motion.  Cardiovascular: Normal rate and normal heart sounds.  Decreased left posterior tibial pulse  Pulmonary/Chest: Breath sounds normal.  Abdominal: Bowel sounds are normal.  Musculoskeletal: Normal range of motion. He exhibits no edema or tenderness.  Neurological: He is alert and oriented to person, place, and time.  Psychiatric: He has a normal mood and affect. His behavior is normal.          Assessment & Plan:   Viral URI Diabetes mellitus well controlled Essential hypertension stable Dyslipidemia continue statin therapy Ongoing tobacco use.  Total smoking cessation encouraged Chronic low back pain  Will check updated lab CPX 6 months  [email or web address],PETER [name]

## 2017-05-30 NOTE — Patient Instructions (Signed)
Smoking tobacco is very bad for your health. You should stop smoking immediately.  Limit your sodium (Salt) intake    It is important that you exercise regularly, at least 20 minutes 3 to 4 times per week.  If you develop chest pain or shortness of breath seek  medical attention.   Please check your hemoglobin A1c every 6  Months  Please see your eye doctor yearly to check for diabetic eye damage Pneumovax

## 2017-05-31 ENCOUNTER — Other Ambulatory Visit (INDEPENDENT_AMBULATORY_CARE_PROVIDER_SITE_OTHER): Payer: PPO

## 2017-05-31 DIAGNOSIS — R5383 Other fatigue: Secondary | ICD-10-CM | POA: Diagnosis not present

## 2017-05-31 DIAGNOSIS — M48062 Spinal stenosis, lumbar region with neurogenic claudication: Secondary | ICD-10-CM | POA: Diagnosis not present

## 2017-05-31 LAB — HEPATITIS C ANTIBODY
HEP C AB: NONREACTIVE
SIGNAL TO CUT-OFF: 0.03 (ref <1.00)

## 2017-05-31 LAB — VITAMIN B12: VITAMIN B 12: 539 pg/mL (ref 211–911)

## 2017-06-07 DIAGNOSIS — M48062 Spinal stenosis, lumbar region with neurogenic claudication: Secondary | ICD-10-CM | POA: Diagnosis not present

## 2017-06-30 ENCOUNTER — Other Ambulatory Visit: Payer: Self-pay | Admitting: Internal Medicine

## 2017-07-05 ENCOUNTER — Other Ambulatory Visit: Payer: Self-pay | Admitting: Internal Medicine

## 2017-08-27 DIAGNOSIS — M545 Low back pain: Secondary | ICD-10-CM | POA: Diagnosis not present

## 2017-10-03 ENCOUNTER — Other Ambulatory Visit: Payer: Self-pay | Admitting: Internal Medicine

## 2017-10-04 DIAGNOSIS — I10 Essential (primary) hypertension: Secondary | ICD-10-CM | POA: Diagnosis not present

## 2017-10-04 DIAGNOSIS — H40013 Open angle with borderline findings, low risk, bilateral: Secondary | ICD-10-CM | POA: Diagnosis not present

## 2017-10-04 DIAGNOSIS — H18413 Arcus senilis, bilateral: Secondary | ICD-10-CM | POA: Diagnosis not present

## 2017-10-04 DIAGNOSIS — H5203 Hypermetropia, bilateral: Secondary | ICD-10-CM | POA: Diagnosis not present

## 2017-10-04 DIAGNOSIS — H52223 Regular astigmatism, bilateral: Secondary | ICD-10-CM | POA: Diagnosis not present

## 2017-10-04 DIAGNOSIS — H25013 Cortical age-related cataract, bilateral: Secondary | ICD-10-CM | POA: Diagnosis not present

## 2017-10-04 DIAGNOSIS — H11153 Pinguecula, bilateral: Secondary | ICD-10-CM | POA: Diagnosis not present

## 2017-10-04 DIAGNOSIS — H524 Presbyopia: Secondary | ICD-10-CM | POA: Diagnosis not present

## 2017-10-04 DIAGNOSIS — H04123 Dry eye syndrome of bilateral lacrimal glands: Secondary | ICD-10-CM | POA: Diagnosis not present

## 2017-10-04 DIAGNOSIS — H35033 Hypertensive retinopathy, bilateral: Secondary | ICD-10-CM | POA: Diagnosis not present

## 2017-10-04 DIAGNOSIS — H11423 Conjunctival edema, bilateral: Secondary | ICD-10-CM | POA: Diagnosis not present

## 2017-10-18 ENCOUNTER — Other Ambulatory Visit: Payer: Self-pay | Admitting: Internal Medicine

## 2017-10-21 NOTE — Telephone Encounter (Signed)
Medication filled to pharmacy as requested.   

## 2017-10-27 ENCOUNTER — Other Ambulatory Visit: Payer: Self-pay | Admitting: Internal Medicine

## 2017-10-30 ENCOUNTER — Ambulatory Visit (INDEPENDENT_AMBULATORY_CARE_PROVIDER_SITE_OTHER): Payer: PPO | Admitting: Internal Medicine

## 2017-10-30 ENCOUNTER — Encounter: Payer: Self-pay | Admitting: Internal Medicine

## 2017-10-30 VITALS — BP 110/60 | HR 69 | Temp 97.9°F | Ht 68.0 in | Wt 194.0 lb

## 2017-10-30 DIAGNOSIS — N182 Chronic kidney disease, stage 2 (mild): Secondary | ICD-10-CM | POA: Diagnosis not present

## 2017-10-30 DIAGNOSIS — Z0000 Encounter for general adult medical examination without abnormal findings: Secondary | ICD-10-CM

## 2017-10-30 DIAGNOSIS — E1329 Other specified diabetes mellitus with other diabetic kidney complication: Secondary | ICD-10-CM

## 2017-10-30 DIAGNOSIS — I10 Essential (primary) hypertension: Secondary | ICD-10-CM | POA: Diagnosis not present

## 2017-10-30 DIAGNOSIS — E785 Hyperlipidemia, unspecified: Secondary | ICD-10-CM | POA: Diagnosis not present

## 2017-10-30 LAB — BASIC METABOLIC PANEL
BUN: 13 mg/dL (ref 6–23)
CHLORIDE: 101 meq/L (ref 96–112)
CO2: 27 mEq/L (ref 19–32)
CREATININE: 1.53 mg/dL — AB (ref 0.40–1.50)
Calcium: 9.5 mg/dL (ref 8.4–10.5)
GFR: 48.89 mL/min — ABNORMAL LOW (ref >60.00)
GLUCOSE: 104 mg/dL — AB (ref 70–99)
POTASSIUM: 4.6 meq/L (ref 3.5–5.1)
Sodium: 135 mEq/L (ref 135–145)

## 2017-10-30 LAB — HEMOGLOBIN A1C: HEMOGLOBIN A1C: 5.7 % (ref 4.6–6.5)

## 2017-10-30 MED ORDER — SILDENAFIL CITRATE 100 MG PO TABS: 100.0000 mg | ORAL_TABLET | Freq: Every day | ORAL | 6 refills | Status: DC | PRN

## 2017-10-30 NOTE — Progress Notes (Signed)
Subjective:    Patient ID: Richard [name], male    DOB: [birth year], 67 y.o.   MRN: [id number]  HPI  67 year old patient who is seen today for a annual preventive health examination as well as a subsequent Medicare wellness visit. He has chronic lumbar pain due to spinal stenosis which limits his activities He discontinued tobacco use about 1 month ago.  He has type 2 diabetes and chronic kidney disease.  Was seen by ophthalmology 1 month ago.  He is careful to avoid all anti-inflammatory medications  Colonoscopy 2016  Social history/retired disabled due to low back pain.  Discontinued tobacco use 1 month ago.  2-3 drinks per day  Past Medical History:  Diagnosis Date  . ANEMIA-NOS 12/06/2008  . BACK PAIN 02/20/2010  . DIABETES MELLITUS, TYPE II 06/01/2008  . GERD 06/01/2008  . HYPERLIPIDEMIA 06/01/2008  . HYPERTENSION 06/01/2008  . Tremors of nervous system    pt states he has had tremors most of his life in his hands/hereditary     Social History   Socioeconomic History  . Marital status: Married    Spouse name: Not on file  . Number of children: Not on file  . Years of education: Not on file  . Highest education level: Not on file  Occupational History  . Not on file  Social Needs  . Financial resource strain: Not on file  . Food insecurity:    Worry: Not on file    Inability: Not on file  . Transportation needs:    Medical: Not on file    Non-medical: Not on file  Tobacco Use  . Smoking status: Former Smoker    Packs/day: 2.00    Types: Cigarettes    Last attempt to quit: 10/02/2017    Years since quitting: 0.0  . Smokeless tobacco: Never Used  Substance and Sexual Activity  . Alcohol use: Yes    Alcohol/week: 1.2 - 1.8 oz    Types: 2 - 3 Standard drinks or equivalent per week  . Drug use: No  . Sexual activity: Not on file  Lifestyle  . Physical activity:    Days per week: Not on file    Minutes per session: Not on file  . Stress: Not on file    Relationships  . Social connections:    Talks on phone: Not on file    Gets together: Not on file    Attends religious service: Not on file    Active member of club or organization: Not on file    Attends meetings of clubs or organizations: Not on file    Relationship status: Not on file  . Intimate partner violence:    Fear of current or ex partner: Not on file    Emotionally abused: Not on file    Physically abused: Not on file    Forced sexual activity: Not on file  Other Topics Concern  . Not on file  Social History Narrative  . Not on file    Past Surgical History:  Procedure Laterality Date  . HAND SURGERY     3 surgeries on right hand    Family History  Problem Relation Age of Onset  . Diabetes Sister   . Diabetes Brother   . Colon cancer Neg Hx     No Known Allergies  Current Outpatient Medications on File Prior to Visit  Medication Sig Dispense Refill  . ACCU-CHEK AVIVA test strip USE ONCE DAILY AND AS NEEDED 100 each  3  . amLODipine (NORVASC) 5 MG tablet TAKE 1 TABLET(5 MG) BY MOUTH DAILY 90 tablet 1  . atorvastatin (LIPITOR) 40 MG tablet Take 1 tablet (40 mg total) by mouth daily. 90 tablet 3  . Lancets (ACCU-CHEK MULTICLIX) lancets 1 each by Other route daily. Use as instructed     . lisinopril (PRINIVIL,ZESTRIL) 40 MG tablet TAKE 1 TABLET BY MOUTH EVERY DAY 90 tablet 1  . metFORMIN (GLUCOPHAGE) 500 MG tablet TAKE 1 TABLET BY MOUTH DAILY WITH BREAKFAST 90 tablet 0  . omeprazole (PRILOSEC) 20 MG capsule TAKE 1 CAPSULE BY MOUTH DAILY 90 capsule 0  . simvastatin (ZOCOR) 40 MG tablet TAKE 1 TABLET BY MOUTH EVERY DAY 90 tablet 0   Current Facility-Administered Medications on File Prior to Visit  Medication Dose Route Frequency Provider Last Rate Last Dose  . aspirin chewable tablet 81 mg  81 mg Oral Once Marletta Lor, MD        BP 110/60 (BP Location: Right Arm, Patient Position: Sitting, Cuff Size: Large)   Pulse 69   Temp 97.9 F (36.6 C) (Oral)    Ht 5\' 8"  (1.727 m)   Wt 194 lb (88 kg)   SpO2 98%   BMI 29.50 kg/m    Subsequent Medicare wellness visit  1. Risk factors, based on past  M,S,F history-cardiovascular  risk factors include hypertension diabetes discontinue tobacco use 1 month ago  2.  Physical activities: Limited due to chronic low back pain secondary to spinal stenosis  3.  Depression/mood: No history major depression or mood disorder  4.  Hearing: No deficits  5.  ADL's: Independent  6.  Fall risk: Low  7.  Home safety: No problems identified  8.  Height weight, and visual acuity; height and weight stable recent eye examination 1 month ago.  No evidence of diabetic retinopathy  9.  Counseling: Modest weight loss increase activity encouraged  10. Lab orders based on risk factors: We will follow-up on renal indices and a hemoglobin A1c  11. Referral : None appropriate at this time  12. Care plan: Continue efforts at aggressive risk factor modification  13. Cognitive assessment: Alert and oriented with normal affect.  No cognitive dysfunction  14. Screening: Patient provided with a written and personalized 5-10 year screening schedule in the AVS.    15. Provider List Update: Primary care ophthalmology orthopedics  .  Cardiovascular risk factors include   Review of Systems  Constitutional: Negative for appetite change, chills, fatigue and fever.  HENT: Negative for congestion, dental problem, ear pain, hearing loss, sore throat, tinnitus, trouble swallowing and voice change.   Eyes: Negative for pain, discharge and visual disturbance.  Respiratory: Negative for cough, chest tightness, wheezing and stridor.   Cardiovascular: Negative for chest pain, palpitations and leg swelling.  Gastrointestinal: Negative for abdominal distention, abdominal pain, blood in stool, constipation, diarrhea, nausea and vomiting.  Genitourinary: Negative for difficulty urinating, discharge, flank pain, genital sores,  hematuria and urgency.  Musculoskeletal: Positive for back pain. Negative for arthralgias, gait problem, joint swelling, myalgias and neck stiffness.  Skin: Negative for rash.  Neurological: Negative for dizziness, syncope, speech difficulty, weakness, numbness and headaches.  Hematological: Negative for adenopathy. Does not bruise/bleed easily.  Psychiatric/Behavioral: Negative for behavioral problems and dysphoric mood. The patient is not nervous/anxious.        Objective:   Physical Exam  Constitutional: He is oriented to person, place, and time. He appears well-developed.  HENT:  Head: Normocephalic.  Right Ear: External ear normal.  Left Ear: External ear normal.  Eyes: Conjunctivae and EOM are normal.  Neck: Normal range of motion.  Cardiovascular: Normal rate and normal heart sounds.  The right posterior tibial pulse intact.  Other pedal pulses not easily palpable  Pulmonary/Chest: Breath sounds normal.  Abdominal: Bowel sounds are normal.  Genitourinary: Rectal exam shows guaiac negative stool.  Genitourinary Comments: Prostate +2 and symmetrical  Musculoskeletal: Normal range of motion. He exhibits no edema or tenderness.  Neurological: He is alert and oriented to person, place, and time.  Psychiatric: He has a normal mood and affect. His behavior is normal.          Assessment & Plan:  Preventive health examination Subsequent Medicare wellness visit Diabetes mellitus.  Will review hemoglobin A1c.  We will continue submaximal metformin dose.  Check renal indices Chronic kidney disease.  Will review GFR.  Will discontinue metformin if GFR is less than 30 and refer to nephrology Hypertension well controlled History of tobacco use.  Presently abstinent Alcohol use.  Moderation encouraged  Follow-up 6 months  Vanessa Alesi [name]

## 2017-10-30 NOTE — Patient Instructions (Addendum)
Limit your sodium (Salt) intake   Please check your hemoglobin A1c every 3-6  Months  You need to lose weight.  Consider a lower calorie diet and regular exercise.  Return in 6 months for follow-up    It is important that you exercise regularly, at least 20 minutes 3 to 4 times per week.  If you develop chest pain or shortness of breath seek  medical attention.

## 2018-01-05 ENCOUNTER — Other Ambulatory Visit: Payer: Self-pay | Admitting: Internal Medicine

## 2018-01-11 ENCOUNTER — Other Ambulatory Visit: Payer: Self-pay | Admitting: Internal Medicine

## 2018-01-14 ENCOUNTER — Encounter: Payer: Self-pay | Admitting: Adult Health

## 2018-01-14 ENCOUNTER — Ambulatory Visit (INDEPENDENT_AMBULATORY_CARE_PROVIDER_SITE_OTHER): Payer: PPO | Admitting: Adult Health

## 2018-01-14 VITALS — BP 116/60 | Temp 97.9°F | Wt 183.0 lb

## 2018-01-14 DIAGNOSIS — Z720 Tobacco use: Secondary | ICD-10-CM

## 2018-01-14 DIAGNOSIS — Z7689 Persons encountering health services in other specified circumstances: Secondary | ICD-10-CM | POA: Diagnosis not present

## 2018-01-14 DIAGNOSIS — K219 Gastro-esophageal reflux disease without esophagitis: Secondary | ICD-10-CM | POA: Diagnosis not present

## 2018-01-14 DIAGNOSIS — E1329 Other specified diabetes mellitus with other diabetic kidney complication: Secondary | ICD-10-CM | POA: Diagnosis not present

## 2018-01-14 DIAGNOSIS — E785 Hyperlipidemia, unspecified: Secondary | ICD-10-CM

## 2018-01-14 DIAGNOSIS — I10 Essential (primary) hypertension: Secondary | ICD-10-CM | POA: Diagnosis not present

## 2018-01-14 NOTE — Progress Notes (Signed)
Patient presents to clinic today to establish care. He is a pleasant 67 year old male who  has a past medical history of ANEMIA-NOS (12/06/2008), BACK PAIN (02/20/2010), DIABETES MELLITUS, TYPE II (06/01/2008), GERD (06/01/2008), HYPERLIPIDEMIA (06/01/2008), HYPERTENSION (06/01/2008), and Tremors of nervous system.  He is a previous patient of Dr. Raliegh Ip who is transferring to me, pending retirement of his PCP  Last physical in May 2019  Acute Concerns: Establish Care  Chronic Issues: Essential Hypertension - Current medication therapy includes Norvasc 5 mg daily, and lisinopril 40 mg daily BP Readings from Last 3 Encounters:  01/14/18 116/60  10/30/17 110/60  05/30/17 138/70   DM -currently prescribed metformin 500 mg daily Lab Results  Component Value Date   HGBA1C 5.7 10/30/2017   GERD - Takes Prilosec - feels as though this is controlled.   Tobacco Use - Reports that he quit for about a month but has since started back up. Currently going through about two packs a day. Has tried chanitx in the past which he did not respond well to. Has also tried patches which did not work for him.   Hyperlipidemia -takes aspirin and Lipitor 40 mg  Lab Results  Component Value Date   CHOL 126 05/30/2017   HDL 41.60 05/30/2017   LDLCALC 72 05/30/2017   TRIG 61.0 05/30/2017   CHOLHDL 3 05/30/2017   Back Pain - h/o history of spinal stenosis.  Has trouble with activities due to discomfort this causes. He is careful to avoid Nsaids. Has been seen by orthopedics   Health Maintenance: Dental -- Does not do routine dental care. Has partial.  Vision -- Routine Care Immunizations -- UTD  Colonoscopy -- Due in December 2019  Diet: Does not eat a diabetic diet.  Exercise: Low back pain causes him to not be able to stay active   Past Medical History:  Diagnosis Date  . ANEMIA-NOS 12/06/2008  . BACK PAIN 02/20/2010  . DIABETES MELLITUS, TYPE II 06/01/2008  . GERD 06/01/2008  . HYPERLIPIDEMIA 06/01/2008    . HYPERTENSION 06/01/2008  . Tremors of nervous system    pt states he has had tremors most of his life in his hands/hereditary    Past Surgical History:  Procedure Laterality Date  . HAND SURGERY     3 surgeries on right hand    Current Outpatient Medications on File Prior to Visit  Medication Sig Dispense Refill  . ACCU-CHEK AVIVA test strip USE ONCE DAILY AND AS NEEDED 100 each 3  . amLODipine (NORVASC) 5 MG tablet TAKE 1 TABLET(5 MG) BY MOUTH DAILY 90 tablet 1  . atorvastatin (LIPITOR) 40 MG tablet TAKE 1 TABLET(40 MG) BY MOUTH DAILY 90 tablet 3  . Lancets (ACCU-CHEK MULTICLIX) lancets 1 each by Other route daily. Use as instructed     . lisinopril (PRINIVIL,ZESTRIL) 40 MG tablet TAKE 1 TABLET BY MOUTH EVERY DAY 90 tablet 0  . metFORMIN (GLUCOPHAGE) 500 MG tablet TAKE 1 TABLET BY MOUTH DAILY WITH BREAKFAST 90 tablet 0  . omeprazole (PRILOSEC) 20 MG capsule TAKE 1 CAPSULE BY MOUTH DAILY 90 capsule 0  . sildenafil (VIAGRA) 100 MG tablet Take 1 tablet (100 mg total) by mouth daily as needed for erectile dysfunction. 10 tablet 6  . simvastatin (ZOCOR) 40 MG tablet TAKE 1 TABLET BY MOUTH EVERY DAY 90 tablet 0   Current Facility-Administered Medications on File Prior to Visit  Medication Dose Route Frequency Provider Last Rate Last Dose  . aspirin chewable tablet  81 mg  81 mg Oral Once Marletta Lor, MD        No Known Allergies  Family History  Problem Relation Age of Onset  . Diabetes Sister   . Diabetes Brother   . Colon cancer Neg Hx     Social History   Socioeconomic History  . Marital status: Married    Spouse name: Not on file  . Number of children: Not on file  . Years of education: Not on file  . Highest education level: Not on file  Occupational History  . Not on file  Social Needs  . Financial resource strain: Not on file  . Food insecurity:    Worry: Not on file    Inability: Not on file  . Transportation needs:    Medical: Not on file     Non-medical: Not on file  Tobacco Use  . Smoking status: Former Smoker    Packs/day: 2.00    Types: Cigarettes    Last attempt to quit: 10/02/2017    Years since quitting: 0.2  . Smokeless tobacco: Never Used  Substance and Sexual Activity  . Alcohol use: Yes    Alcohol/week: 1.2 - 1.8 oz    Types: 2 - 3 Standard drinks or equivalent per week  . Drug use: No  . Sexual activity: Not on file  Lifestyle  . Physical activity:    Days per week: Not on file    Minutes per session: Not on file  . Stress: Not on file  Relationships  . Social connections:    Talks on phone: Not on file    Gets together: Not on file    Attends religious service: Not on file    Active member of club or organization: Not on file    Attends meetings of clubs or organizations: Not on file    Relationship status: Not on file  . Intimate partner violence:    Fear of current or ex partner: Not on file    Emotionally abused: Not on file    Physically abused: Not on file    Forced sexual activity: Not on file  Other Topics Concern  . Not on file  Social History Narrative  . Not on file    Review of Systems  HENT: Positive for hearing loss.   Respiratory: Negative.   Cardiovascular: Negative.   Gastrointestinal: Negative.   Musculoskeletal: Positive for back pain.  Neurological: Negative.   Psychiatric/Behavioral: Negative.      BP 116/60   Temp 97.9 F (36.6 C) (Oral)   Wt 183 lb (83 kg)   BMI 27.83 kg/m   Physical Exam  Constitutional: He appears well-developed and well-nourished. No distress.  Cardiovascular: Normal rate, regular rhythm, normal heart sounds and intact distal pulses.  Pulmonary/Chest: Effort normal and breath sounds normal.  Abdominal: Soft. Bowel sounds are normal. He exhibits no distension and no mass. There is no tenderness. There is no rebound and no guarding. No hernia.  Skin: Skin is warm and dry. Capillary refill takes less than 2 seconds. He is not diaphoretic.    Psychiatric: He has a normal mood and affect. His behavior is normal. Judgment and thought content normal.  Nursing note and vitals reviewed.   Recent Results (from the past 2160 hour(s))  Basic metabolic panel     Status: Abnormal   Collection Time: 10/30/17  8:35 AM  Result Value Ref Range   Sodium 135 135 - 145 mEq/L   Potassium 4.6  3.5 - 5.1 mEq/L   Chloride 101 96 - 112 mEq/L   CO2 27 19 - 32 mEq/L   Glucose, Bld 104 (H) 70 - 99 mg/dL   BUN 13 6 - 23 mg/dL   Creatinine, Ser 1.53 (H) 0.40 - 1.50 mg/dL   Calcium 9.5 8.4 - 10.5 mg/dL   GFR 48.89 (L) >60.00 mL/min  Hemoglobin A1c     Status: None   Collection Time: 10/30/17  8:35 AM  Result Value Ref Range   Hgb A1c MFr Bld 5.7 4.6 - 6.5 %    Comment: Glycemic Control Guidelines for People with Diabetes:Non Diabetic:  <6%Goal of Therapy: <7%Additional Action Suggested:  >8%     Assessment/Plan: 1. Encounter to establish care - CPE as directed - Follow up sooner if needed  2. Diabetes mellitus of other type with other kidney complication, unspecified whether long term insulin use ([place]) - Well controlled with Metformin. No change  Will follow up in November for recheck   3. Essential hypertension - Well controlled. No change in medication   4. Tobacco use - Encouraged to quit smoking   5. Dyslipidemia - Continue with statin and Asa  6. Gastroesophageal reflux disease without esophagitis - Continue with Prilosec   [name], NP

## 2018-03-22 IMAGING — CT CT L SPINE W/ CM
1 of 6 series · 5 of 14 positions shown, 7 images · non-contrast
Comparison: 11/28/2016 lumbar spine MRI from [REDACTED]

CLINICAL DATA: Lumbar spinal stenosis with neurogenic claudication.
Pain in the low back, bilateral hips, and bilateral lower
extremities.
TECHNIQUE: Contiguous axial images were obtained through the Lumbar spine after
the intrathecal infusion of infusion. Coronal and sagittal
reconstructions were obtained of the axial image sets.

[Series 2: l spine soft · axial · 0.27mm/px · z∈[-362,-194]mm · 5 of 86 slices shown, 7 images]
[im 15/86  soft-tissue]
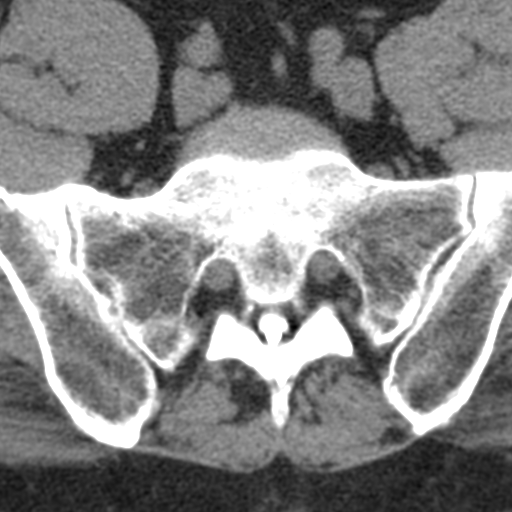
[im 15/86  bone]
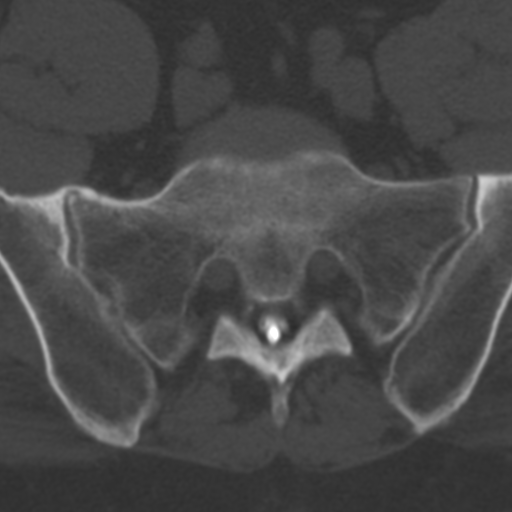
[im 29/86  bone]
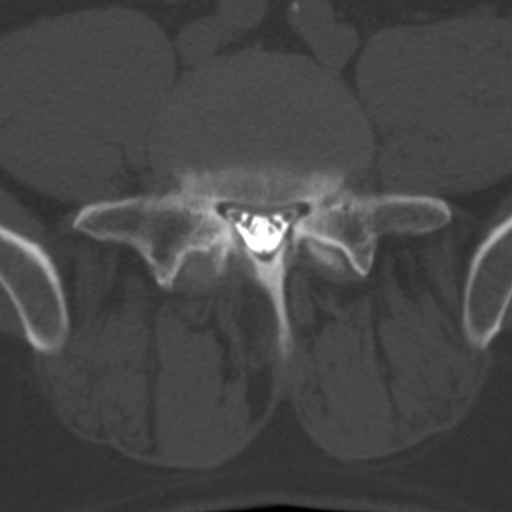
[im 43/86  bone]
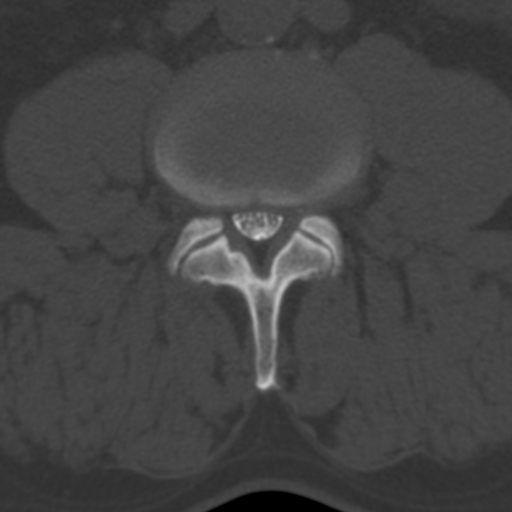
[im 57/86  bone]
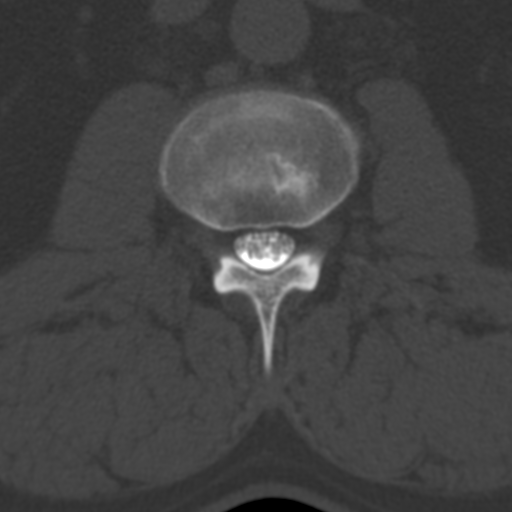
[im 71/86  soft-tissue]
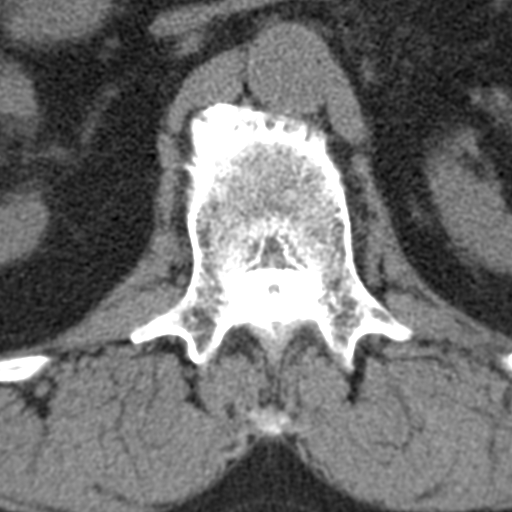
[im 71/86  bone]
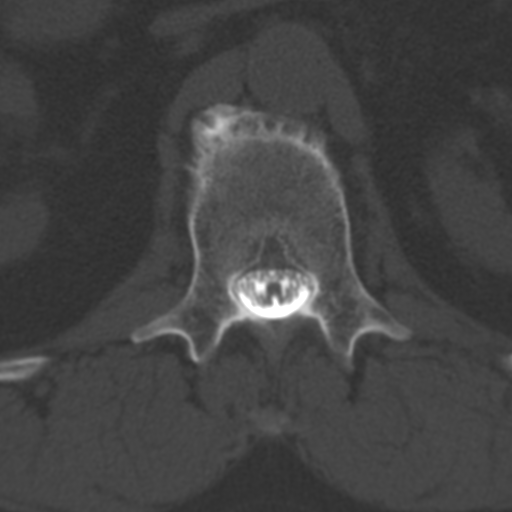

[5 of 14 positions shown; findings below may reference images not displayed]

EXAM:
LUMBAR MYELOGRAM

FLUOROSCOPY TIME:  Radiation Exposure Index (as provided by the
fluoroscopic device): 236.67 microGray*m^2

Fluoroscopy Time (in minutes and seconds):  14 second

PROCEDURE:
After thorough discussion of risks and benefits of the procedure
including bleeding, infection, injury to nerves, blood vessels,
adjacent structures as well as headache and CSF leak, written and
oral informed consent was obtained. Consent was obtained by Dr.
Avinash Lippert. Time out form was completed.

Patient was positioned prone on the fluoroscopy table. Local
anesthesia was provided with 1% lidocaine without epinephrine after
prepped and draped in the usual sterile fashion. Puncture was
performed at L4-5 using a 3 1/2 inch 22-gauge spinal needle via a
left interlaminar approach. Using a single pass through the dura,
the needle was placed within the thecal sac, with return of clear
CSF. 15 mL of Isovue-M 200 was injected into the thecal sac, with
normal opacification of the nerve roots and cauda equina consistent
with free flow within the subarachnoid space.

I personally performed the lumbar puncture and administered the
intrathecal contrast. I also personally supervised acquisition of
the myelogram images.
FINDINGS: LUMBAR MYELOGRAM FINDINGS:

Lumbar segmentation is normal. Vertebral alignment is normal. No
abnormal motion is identified on upright flexion or extension
images. There is mild waist like narrowing of the thecal sac at L4-5
most notably in the lateral recesses. There is also slight AP
narrowing of the thecal sac at L3-4 most notable on upright images.

CT LUMBAR MYELOGRAM FINDINGS:

Vertebral alignment is normal. Vertebral body heights are preserved
without acute fracture or suspicious osseous lesion identified. An
old, healed pars defect is suspected on the right at L5. The conus
medullaris terminates at L1. Abdominal aortic atherosclerosis is
noted.

T12-L1 and L1-2: Anterior spondylosis and mild vacuum disc at each
level. Mild right facet arthrosis and minimal disc bulging at L1-2.
No stenosis.

L2-3: Minimal disc bulging and slight facet and ligamentum flavum
hypertrophy without stenosis.

L3-4: Mild disc bulging, congenitally short pedicles, and mild facet
and ligamentum flavum hypertrophy result in mild spinal stenosis and
mild left neural foraminal stenosis.

L4-5: Mild disc bulging and facet hypertrophy result in mild left
greater than right neural foraminal stenosis. Mild lateral recess
narrowing is more conspicuous on the conventional myelographic
images and on the prior MRI than on the CT.

L5-S1: Minimal disc bulging and mild facet hypertrophy result in
minimal left neural foraminal stenosis without spinal stenosis.
IMPRESSION: 1. Mild congenital and acquired spinal stenosis at L3-4 and L4-5.
2. Mild neural foraminal stenosis on the left at L3-4 and
bilaterally at L4-5.

## 2018-03-22 IMAGING — XA DG MYELOGRAPHY LUMBAR INJ LUMBOSACRAL
13 of 17 series · 13 of 17 positions shown · non-contrast
Comparison: 11/28/2016 lumbar spine MRI from [REDACTED]

CLINICAL DATA: Lumbar spinal stenosis with neurogenic claudication.
Pain in the low back, bilateral hips, and bilateral lower
extremities.
TECHNIQUE: Contiguous axial images were obtained through the Lumbar spine after
the intrathecal infusion of infusion. Coronal and sagittal
reconstructions were obtained of the axial image sets.

[Series 1: vasc standard · 1 of 1 slices shown (1 of 11)]
[im 1/1]
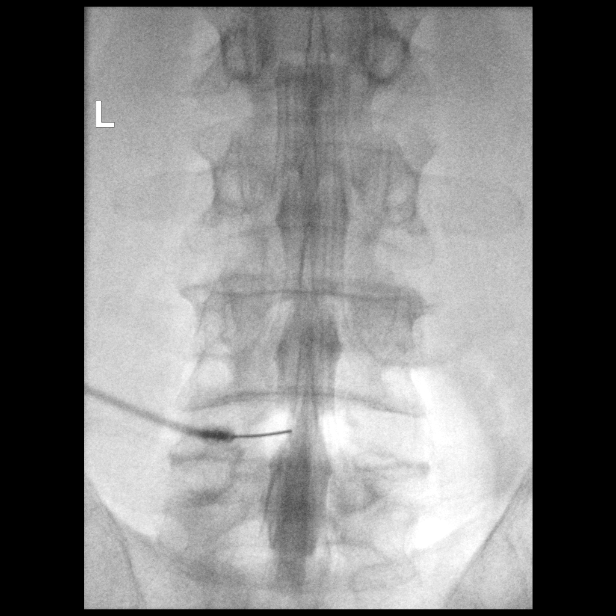

[Series 1: w lumbar spine lat · 0.15mm/px · 1 of 1 slices shown]
[im 1/1]
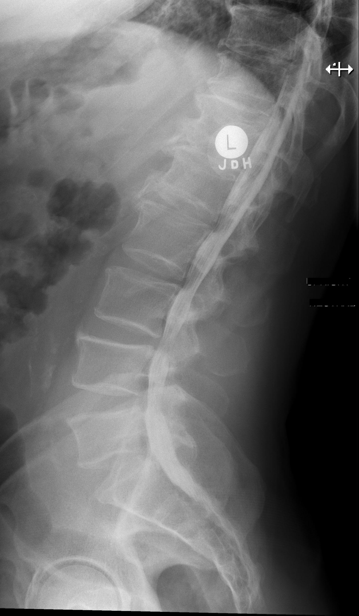

[Series 2: vasc standard · 1 of 1 slices shown (2 of 11)]
[im 1/1]
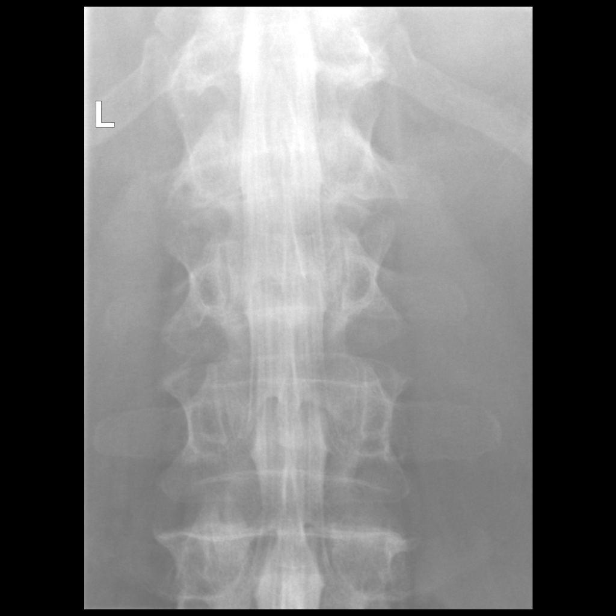

[Series 3: w lumbar spine extension · 0.15mm/px · 1 of 1 slices shown]
[im 1/1]
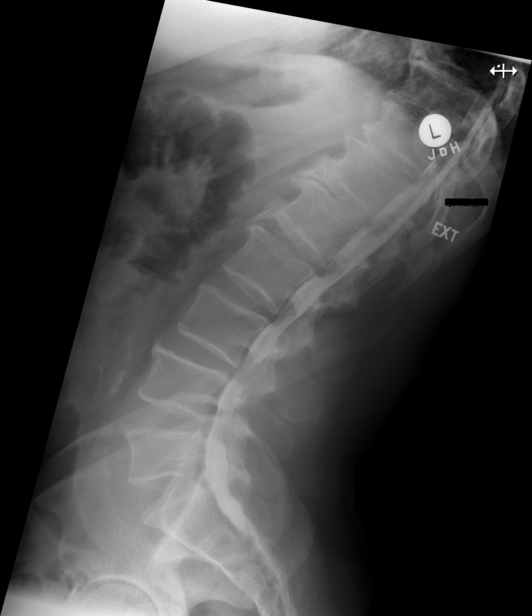

[Series 3: vasc standard · 1 of 1 slices shown (3 of 11)]
[im 1/1]
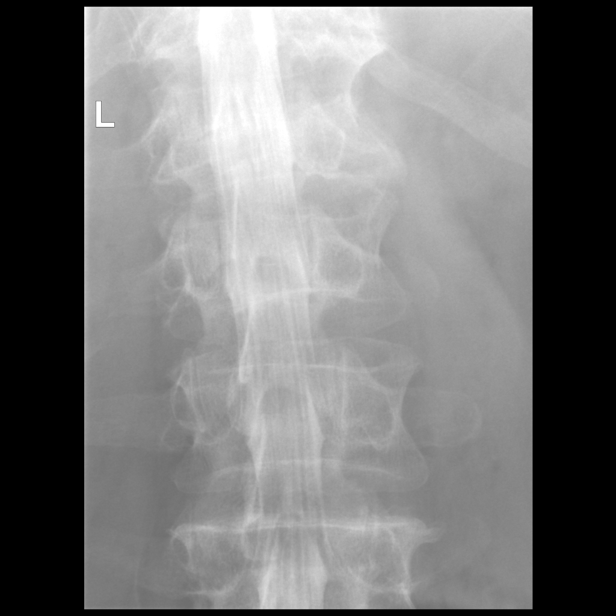

[Series 5: vasc standard · 1 of 1 slices shown (4 of 11)]
[im 1/1]
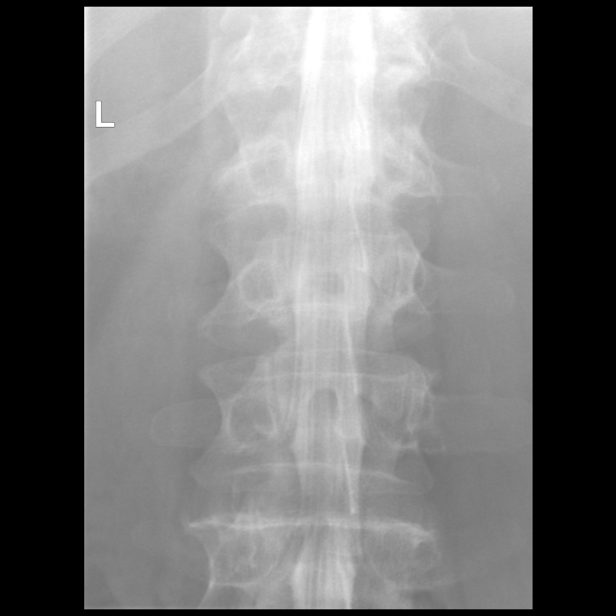

[Series 6: vasc standard · 1 of 1 slices shown (5 of 11)]
[im 1/1]
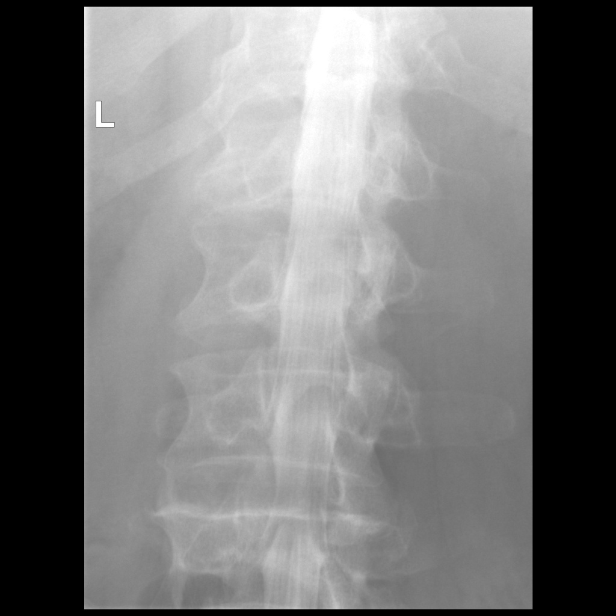

[Series 7: vasc standard · 1 of 1 slices shown (6 of 11)]
[im 1/1]
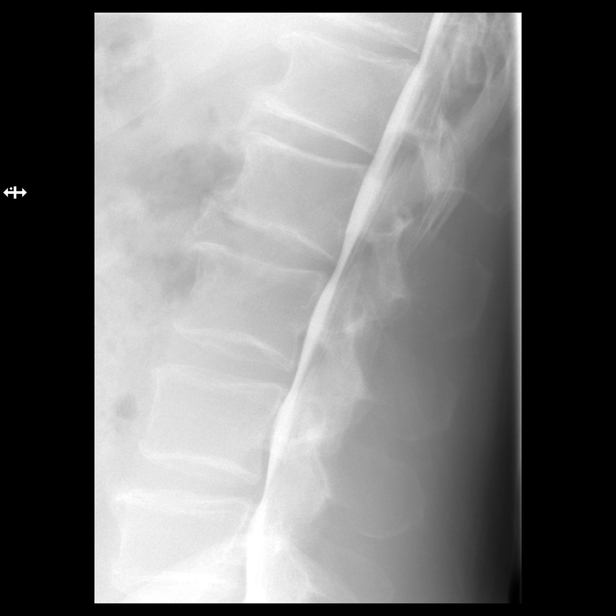

[Series 9: vasc standard · 1 of 1 slices shown (7 of 11)]
[im 1/1]
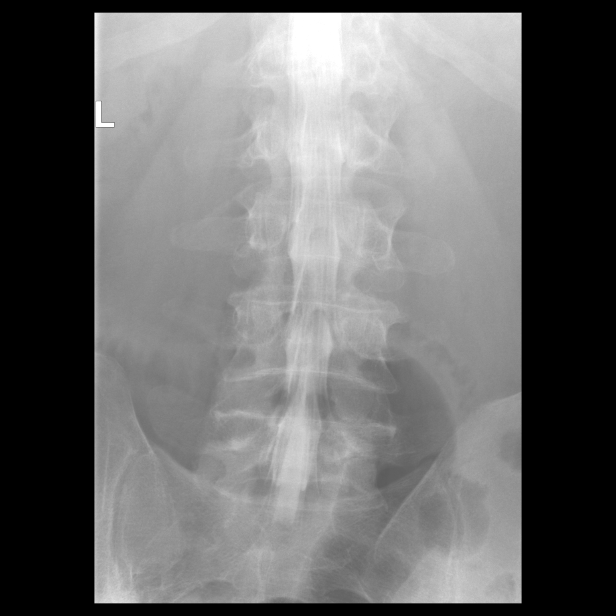

[Series 10: vasc standard · 1 of 1 slices shown (8 of 11)]
[im 1/1]
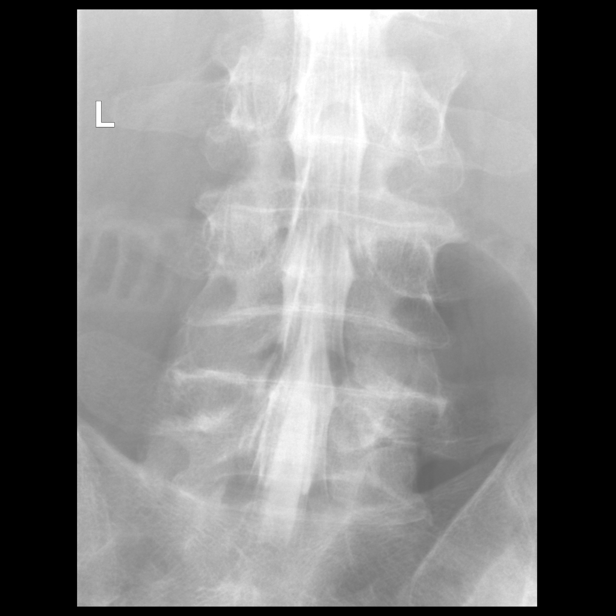

[Series 11: vasc standard · 1 of 1 slices shown (9 of 11)]
[im 1/1]
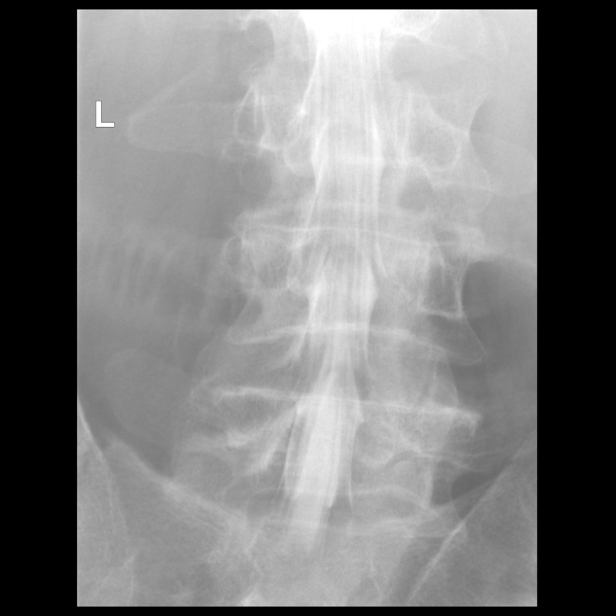

[Series 13: vasc standard · 1 of 1 slices shown (10 of 11)]
[im 1/1]
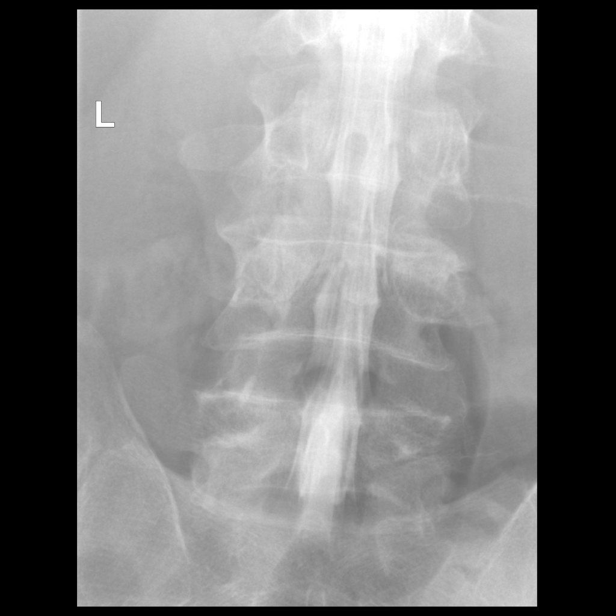

[Series 14: vasc standard · 1 of 1 slices shown (11 of 11)]
[im 1/1]
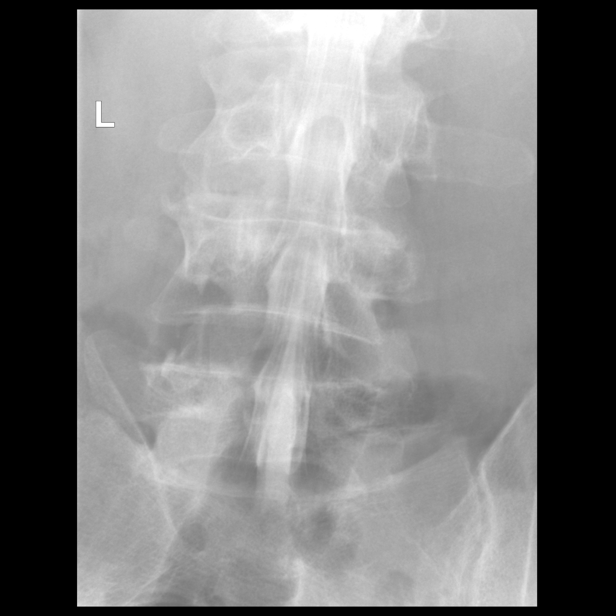

[13 of 17 positions shown; findings below may reference images not displayed]

EXAM:
LUMBAR MYELOGRAM

FLUOROSCOPY TIME:  Radiation Exposure Index (as provided by the
fluoroscopic device): 236.67 microGray*m^2

Fluoroscopy Time (in minutes and seconds):  14 second

PROCEDURE:
After thorough discussion of risks and benefits of the procedure
including bleeding, infection, injury to nerves, blood vessels,
adjacent structures as well as headache and CSF leak, written and
oral informed consent was obtained. Consent was obtained by Dr.
Avinash Lippert. Time out form was completed.

Patient was positioned prone on the fluoroscopy table. Local
anesthesia was provided with 1% lidocaine without epinephrine after
prepped and draped in the usual sterile fashion. Puncture was
performed at L4-5 using a 3 1/2 inch 22-gauge spinal needle via a
left interlaminar approach. Using a single pass through the dura,
the needle was placed within the thecal sac, with return of clear
CSF. 15 mL of Isovue-M 200 was injected into the thecal sac, with
normal opacification of the nerve roots and cauda equina consistent
with free flow within the subarachnoid space.

I personally performed the lumbar puncture and administered the
intrathecal contrast. I also personally supervised acquisition of
the myelogram images.
FINDINGS: LUMBAR MYELOGRAM FINDINGS:

Lumbar segmentation is normal. Vertebral alignment is normal. No
abnormal motion is identified on upright flexion or extension
images. There is mild waist like narrowing of the thecal sac at L4-5
most notably in the lateral recesses. There is also slight AP
narrowing of the thecal sac at L3-4 most notable on upright images.

CT LUMBAR MYELOGRAM FINDINGS:

Vertebral alignment is normal. Vertebral body heights are preserved
without acute fracture or suspicious osseous lesion identified. An
old, healed pars defect is suspected on the right at L5. The conus
medullaris terminates at L1. Abdominal aortic atherosclerosis is
noted.

T12-L1 and L1-2: Anterior spondylosis and mild vacuum disc at each
level. Mild right facet arthrosis and minimal disc bulging at L1-2.
No stenosis.

L2-3: Minimal disc bulging and slight facet and ligamentum flavum
hypertrophy without stenosis.

L3-4: Mild disc bulging, congenitally short pedicles, and mild facet
and ligamentum flavum hypertrophy result in mild spinal stenosis and
mild left neural foraminal stenosis.

L4-5: Mild disc bulging and facet hypertrophy result in mild left
greater than right neural foraminal stenosis. Mild lateral recess
narrowing is more conspicuous on the conventional myelographic
images and on the prior MRI than on the CT.

L5-S1: Minimal disc bulging and mild facet hypertrophy result in
minimal left neural foraminal stenosis without spinal stenosis.
IMPRESSION: 1. Mild congenital and acquired spinal stenosis at L3-4 and L4-5.
2. Mild neural foraminal stenosis on the left at L3-4 and
bilaterally at L4-5.

## 2018-04-14 ENCOUNTER — Other Ambulatory Visit: Payer: Self-pay | Admitting: Internal Medicine

## 2018-04-14 NOTE — Telephone Encounter (Signed)
Richard [name]'s pt  

## 2018-04-15 NOTE — Telephone Encounter (Signed)
Sent to the pharmacy by e-scribe.  Pt has upcoming appt on 05/16/18.

## 2018-04-21 ENCOUNTER — Other Ambulatory Visit: Payer: Self-pay | Admitting: Internal Medicine

## 2018-04-21 NOTE — Telephone Encounter (Signed)
[name] pt 

## 2018-04-22 NOTE — Telephone Encounter (Signed)
Sent to the pharmacy by e-scribe.  Pt has upcoming appt. 

## 2018-05-16 ENCOUNTER — Ambulatory Visit (INDEPENDENT_AMBULATORY_CARE_PROVIDER_SITE_OTHER): Payer: PPO | Admitting: Adult Health

## 2018-05-16 ENCOUNTER — Encounter: Payer: Self-pay | Admitting: Adult Health

## 2018-05-16 VITALS — BP 126/70 | Temp 98.0°F | Wt 182.0 lb

## 2018-05-16 DIAGNOSIS — E1329 Other specified diabetes mellitus with other diabetic kidney complication: Secondary | ICD-10-CM

## 2018-05-16 DIAGNOSIS — D492 Neoplasm of unspecified behavior of bone, soft tissue, and skin: Secondary | ICD-10-CM

## 2018-05-16 DIAGNOSIS — R0683 Snoring: Secondary | ICD-10-CM

## 2018-05-16 DIAGNOSIS — Z23 Encounter for immunization: Secondary | ICD-10-CM

## 2018-05-16 LAB — POCT GLYCOSYLATED HEMOGLOBIN (HGB A1C): HbA1c, POC (controlled diabetic range): 5.3 % (ref 0.0–7.0)

## 2018-05-16 NOTE — Progress Notes (Signed)
Established Patient Office Visit  Subjective:  Patient ID: Richard [name], male    DOB: Oct 01, 1950  Age: 67 y.o. MRN: [id number]  CC:  Chief Complaint  Patient presents with  . Follow-up    HPI Richard [name] presents for diabetic follow up. He is currently maintained on Metformin 500 mg daily. He denies n/v/d. He checks his blood sugars at home periodically and reports reading consistently in the low 100's.   Lab Results  Component Value Date   HGBA1C 5.7 10/30/2017    He would also like to be referred to dermatology for a skin grown on the right side of his nose.   Additionally, he would like to know what he can do to quit snoring. His wife reports that he snores very loudly. He denies feeling fatigued when he wakes up or when during the day. There are no reports of him waking up gasping for breath. He continues to smoke and is going to work on quitting on his birthday.    Past Medical History:  Diagnosis Date  . ANEMIA-NOS 12/06/2008  . BACK PAIN 02/20/2010  . DIABETES MELLITUS, TYPE II 06/01/2008  . GERD 06/01/2008  . HYPERLIPIDEMIA 06/01/2008  . HYPERTENSION 06/01/2008  . Tremors of nervous system    pt states he has had tremors most of his life in his hands/hereditary    Past Surgical History:  Procedure Laterality Date  . HAND SURGERY     3 surgeries on right hand    Family History  Problem Relation Age of Onset  . Diabetes Sister   . Alzheimer's disease Sister   . Diabetes Brother   . Heart disease Mother   . Sudden death Father        Suicide after his wife passed away   . Colon cancer Neg Hx     Social History   Socioeconomic History  . Marital status: Married    Spouse name: Not on file  . Number of children: Not on file  . Years of education: Not on file  . Highest education level: Not on file  Occupational History  . Not on file  Social Needs  . Financial resource strain: Not on file  . Food insecurity:    Worry: Not on file    Inability:  Not on file  . Transportation needs:    Medical: Not on file    Non-medical: Not on file  Tobacco Use  . Smoking status: Former Smoker    Packs/day: 2.00    Types: Cigarettes    Last attempt to quit: 10/02/2017    Years since quitting: 0.6  . Smokeless tobacco: Never Used  Substance and Sexual Activity  . Alcohol use: Yes    Alcohol/week: 2.0 - 3.0 standard drinks    Types: 2 - 3 Standard drinks or equivalent per week  . Drug use: No  . Sexual activity: Not on file  Lifestyle  . Physical activity:    Days per week: Not on file    Minutes per session: Not on file  . Stress: Not on file  Relationships  . Social connections:    Talks on phone: Not on file    Gets together: Not on file    Attends religious service: Not on file    Active member of club or organization: Not on file    Attends meetings of clubs or organizations: Not on file    Relationship status: Not on file  . Intimate partner violence:  Fear of current or ex partner: Not on file    Emotionally abused: Not on file    Physically abused: Not on file    Forced sexual activity: Not on file  Other Topics Concern  . Not on file  Social History Narrative  . Not on file    Outpatient Medications Prior to Visit  Medication Sig Dispense Refill  . ACCU-CHEK AVIVA test strip USE ONCE DAILY AND AS NEEDED 100 each 3  . amLODipine (NORVASC) 5 MG tablet TAKE 1 TABLET(5 MG) BY MOUTH DAILY 90 tablet 0  . atorvastatin (LIPITOR) 40 MG tablet TAKE 1 TABLET(40 MG) BY MOUTH DAILY 90 tablet 3  . Lancets (ACCU-CHEK MULTICLIX) lancets 1 each by Other route daily. Use as instructed     . lisinopril (PRINIVIL,ZESTRIL) 40 MG tablet TAKE 1 TABLET BY MOUTH EVERY DAY 90 tablet 0  . metFORMIN (GLUCOPHAGE) 500 MG tablet TAKE 1 TABLET BY MOUTH DAILY WITH BREAKFAST 90 tablet 0  . omeprazole (PRILOSEC) 20 MG capsule TAKE 1 CAPSULE BY MOUTH DAILY 90 capsule 0  . sildenafil (VIAGRA) 100 MG tablet Take 1 tablet (100 mg total) by mouth daily as  needed for erectile dysfunction. 10 tablet 6  . simvastatin (ZOCOR) 40 MG tablet TAKE 1 TABLET BY MOUTH EVERY DAY 90 tablet 0   Facility-Administered Medications Prior to Visit  Medication Dose Route Frequency Provider Last Rate Last Dose  . aspirin chewable tablet 81 mg  81 mg Oral Once Marletta Lor, MD        No Known Allergies  ROS Review of Systems  See HPI      Objective:    Physical Exam  Constitutional: He is oriented to person, place, and time. He appears well-developed and well-nourished. No distress.  HENT:  Head: Normocephalic and atraumatic.  Right Ear: External ear normal.  Left Ear: External ear normal.  Nose: Nose normal.  Mouth/Throat: Oropharynx is clear and moist. No oropharyngeal exudate.  Eyes: Pupils are equal, round, and reactive to light. Conjunctivae and EOM are normal. Right eye exhibits no discharge. Left eye exhibits no discharge. No scleral icterus.  Cardiovascular: Normal rate, regular rhythm, normal heart sounds and intact distal pulses. Exam reveals no gallop and no friction rub.  No murmur heard. Pulmonary/Chest: Effort normal. No respiratory distress. He has wheezes. He has no rales. He exhibits no tenderness.  Musculoskeletal: Normal range of motion. He exhibits no edema, tenderness or deformity.  Neurological: He is alert and oriented to person, place, and time. He has normal reflexes.  Skin: Skin is warm and dry. He is not diaphoretic.  Skin neoplasm on right nare.   Psychiatric: He has a normal mood and affect. His behavior is normal. Judgment and thought content normal.  Nursing note and vitals reviewed.   BP 126/70   Temp 98 F (36.7 C)   Wt 182 lb (82.6 kg)   BMI 27.67 kg/m  Wt Readings from Last 3 Encounters:  05/16/18 182 lb (82.6 kg)  01/14/18 183 lb (83 kg)  10/30/17 194 lb (88 kg)     Health Maintenance Due  Topic Date Due  . HIV Screening  06/08/1968  . OPHTHALMOLOGY EXAM  04/22/2012  . TETANUS/TDAP   07/03/2015  . INFLUENZA VACCINE  01/30/2018  . HEMOGLOBIN A1C  05/02/2018    There are no preventive care reminders to display for this patient.  Lab Results  Component Value Date   TSH 0.78 05/30/2017   Lab Results  Component Value  Date   WBC 4.5 05/30/2017   HGB 15.8 05/30/2017   HCT 46.1 05/30/2017   MCV 108.4 (H) 05/30/2017   PLT 175.0 05/30/2017   Lab Results  Component Value Date   NA 135 10/30/2017   K 4.6 10/30/2017   CO2 27 10/30/2017   GLUCOSE 104 (H) 10/30/2017   BUN 13 10/30/2017   CREATININE 1.53 (H) 10/30/2017   BILITOT 0.5 05/30/2017   ALKPHOS 86 05/30/2017   AST 29 05/30/2017   ALT 27 05/30/2017   PROT 6.8 05/30/2017   ALBUMIN 4.0 05/30/2017   CALCIUM 9.5 10/30/2017   GFR 48.89 (L) 10/30/2017   Lab Results  Component Value Date   CHOL 126 05/30/2017   Lab Results  Component Value Date   HDL 41.60 05/30/2017   Lab Results  Component Value Date   LDLCALC 72 05/30/2017   Lab Results  Component Value Date   TRIG 61.0 05/30/2017   Lab Results  Component Value Date   CHOLHDL 3 05/30/2017   Lab Results  Component Value Date   HGBA1C 5.3 05/16/2018      Assessment & Plan:   Problem List Items Addressed This Visit    Diabetes mellitus with renal complications ([place]) - Primary   Relevant Orders   POC HgB A1c (Completed)    Other Visit Diagnoses    Skin neoplasm       Relevant Orders   Ambulatory referral to Dermatology   Snoring       Relevant Orders   Ambulatory referral to Pulmonology     Encouraged to quit smoking   Follow-up:  January for CPE    [name], NP

## 2018-05-16 NOTE — Addendum Note (Signed)
Addended by: Miles Costain T on: 05/16/2018 08:20 AM   Modules accepted: Orders

## 2018-05-16 NOTE — Patient Instructions (Signed)
Your A1c was 5.3   Someone will call you about your dermatology & Pulmonary Appointment   Please follow up in December or January for your physical

## 2018-05-19 ENCOUNTER — Other Ambulatory Visit: Payer: Self-pay | Admitting: Pharmacist

## 2018-05-19 NOTE — Patient Outreach (Signed)
[facility]) Care Management  05/19/2018  [name] [birth year] [id number]   Outreach call to [name] regarding his request for follow up from the [facility] Medication Adherence Campaign.  Mr. Gillson reports that he takes his atorvastatin 40 mg once daily as directed. Denies any missed doses or barriers to adherence. Counsel patient on the importance of adherence to this medication.  Patient denies any medication questions/concerns at this time. Will close pharmacy episode.  Harlow Asa, PharmD, [facility] [phone]

## 2018-06-09 ENCOUNTER — Encounter: Payer: Self-pay | Admitting: Internal Medicine

## 2018-06-18 ENCOUNTER — Encounter: Payer: Self-pay | Admitting: Pulmonary Disease

## 2018-06-18 ENCOUNTER — Ambulatory Visit (INDEPENDENT_AMBULATORY_CARE_PROVIDER_SITE_OTHER): Payer: PPO | Admitting: Pulmonary Disease

## 2018-06-18 ENCOUNTER — Ambulatory Visit (AMBULATORY_SURGERY_CENTER): Payer: Self-pay

## 2018-06-18 VITALS — BP 122/80 | HR 65 | Ht 68.0 in | Wt 187.0 lb

## 2018-06-18 VITALS — Ht 68.0 in | Wt 188.4 lb

## 2018-06-18 DIAGNOSIS — Z86010 Personal history of colonic polyps: Secondary | ICD-10-CM

## 2018-06-18 DIAGNOSIS — G4733 Obstructive sleep apnea (adult) (pediatric): Secondary | ICD-10-CM

## 2018-06-18 DIAGNOSIS — Z720 Tobacco use: Secondary | ICD-10-CM | POA: Diagnosis not present

## 2018-06-18 MED ORDER — NA SULFATE-K SULFATE-MG SULF 17.5-3.13-1.6 GM/177ML PO SOLN: 1.0000 | Freq: Once | ORAL | 0 refills | Status: AC

## 2018-06-18 NOTE — Progress Notes (Signed)
Subjective:    Patient ID: Richard [name], male    DOB: 01-Jul-1951, 67 y.o.   MRN: [id number]  HPI   Chief Complaint  Patient presents with  . Sleep Consult    Pt states that he snores heavily at night    67 year old heavy smoker presents for evaluation of sleep disordered breathing. His wife has noted loud snoring and after 30 years and has moved out of the bedroom.  He reports nasal injuries at least 3 times in his life and wonders if snoring may be related to that.  He denies excessive daytime somnolence and fatigue. Epworth sleepiness score is 9 and he reports sleepiness while watching TV, or as a passenger in a car.  He has a retired lifestyle. Bedtime is between 10:51 PM, sleep latency can be up to 30 minutes, he sleeps on his side with one pillow, reports 2 nocturnal awakenings including nocturia and is out of bed by 8 AM feeling rested with dryness of mouth but denies headaches.  He wakes up with a stuffy nose which seems to get better as the day goes by. He has gained about 20 pounds in the last few years There is no history suggestive of cataplexy, sleep paralysis or parasomnias  He is a diabetic hypertensive and has hyperlipidemia. He has familial tremors which are quite severe. He smoked about 2 packs/day and he has just quit about 1 week ago.  He tried nicotine patch but this did not help.  He holds a straw in his hand which he bites on whenever he feels the urge.  He has smoked for more than 30 pack years. He drinks 3 drinks daily of liquor, denies any episode of alcohol withdrawal.  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 ANEMIA-NOS 12/06/2008  . BACK PAIN 02/20/2010  . DIABETES MELLITUS, TYPE II 06/01/2008  . GERD 06/01/2008  . HYPERLIPIDEMIA 06/01/2008  . HYPERTENSION 06/01/2008  . Tremors of nervous system    pt states he has had tremors most of his  life in his hands/hereditary     Past Surgical History:  Procedure Laterality Date  . COLONOSCOPY    . ELBOW BURSA SURGERY     right elbow  . HAND SURGERY     2 surgeries on right hand  . HAND SURGERY     left hand/removed a bone    No Known Allergies   Social History   Socioeconomic History  . Marital status: Married    Spouse name: Not on file  . Number of children: Not on file  . Years of education: Not on file  . Highest education level: Not on file  Occupational History  . Not on file  Social Needs  . Financial resource strain: Not on file  . Food insecurity:    Worry: Not on file    Inability: Not on file  . Transportation needs:    Medical: Not on file    Non-medical: Not on file  Tobacco Use  . Smoking status: Former Smoker    Packs/day: 2.00    Types: Cigarettes    Last attempt to quit: 06/08/2018    Years since quitting: 0.0  . Smokeless tobacco: Never Used  Substance and Sexual Activity  . Alcohol use: Yes    Alcohol/week: 2.0 - 3.0 standard drinks    Types:  2 - 3 Standard drinks or equivalent per week  . Drug use: No  . Sexual activity: Not on file  Lifestyle  . Physical activity:    Days per week: Not on file    Minutes per session: Not on file  . Stress: Not on file  Relationships  . Social connections:    Talks on phone: Not on file    Gets together: Not on file    Attends religious service: Not on file    Active member of club or organization: Not on file    Attends meetings of clubs or organizations: Not on file    Relationship status: Not on file  . Intimate partner violence:    Fear of current or ex partner: Not on file    Emotionally abused: Not on file    Physically abused: Not on file    Forced sexual activity: Not on file  Other Topics Concern  . Not on file  Social History Narrative  . Not on file     Family History  Problem Relation Age of Onset  . Diabetes Sister   . Alzheimer's disease Sister   . Diabetes Brother    . Heart disease Mother   . Sudden death Father        Suicide after his wife passed away   . Colon cancer Neg Hx     Review of Systems  Positive for indigestion, nasal congestion, joint stiffness   Constitutional: negative for anorexia, fevers and sweats  Eyes: negative for irritation, redness and visual disturbance  Ears, nose, mouth, throat, and face: negative for earaches, epistaxis, nasal congestion and sore throat  Respiratory: negative for cough, dyspnea on exertion, sputum and wheezing  Cardiovascular: negative for chest pain, dyspnea, lower extremity edema, orthopnea, palpitations and syncope  Gastrointestinal: negative for abdominal pain, constipation, diarrhea, melena, nausea and vomiting  Genitourinary:negative for dysuria, frequency and hematuria  Hematologic/lymphatic: negative for bleeding, easy bruising and lymphadenopathy  Musculoskeletal:negative for arthralgias, muscle weakness   Neurological: negative for coordination problems, gait problems, headaches and weakness  Endocrine: negative for diabetic symptoms including polydipsia, polyuria and weight loss     Objective:   Physical Exam  Gen. Pleasant, well-nourished, in no distress, anxious affect ENT - no pallor,icterus, no post nasal drip, deviated septum, class 2 airway Neck: No JVD, no thyromegaly, no carotid bruits Lungs: no use of accessory muscles, no dullness to percussion, clear without rales or rhonchi  Cardiovascular: Rhythm regular, heart sounds  normal, no murmurs or gallops, no peripheral edema Abdomen: soft and non-tender, no hepatosplenomegaly, BS normal. Musculoskeletal: No deformities, no cyanosis or clubbing Neuro:  alert, non focal, tremors +        Assessment & Plan:

## 2018-06-18 NOTE — Assessment & Plan Note (Signed)
Given excessive daytime somnolence, narrow pharyngeal exam & loud snoring, obstructive sleep apnea is very likely & an overnight polysomnogram will be scheduled as a home study. The pathophysiology of obstructive sleep apnea , it's cardiovascular consequences & modes of treatment including CPAP were discused with the patient in detail & they evidenced understanding.  Pretest probability is intermediate.  No bed partner history is available.  He has a disabled/retired lifestyle so difficult to distinguish excessive daytime fatigue from sleepiness.  He is willing to use a CPAP if needed

## 2018-06-18 NOTE — Progress Notes (Signed)
Per pt, no allergies to soy or egg products.Pt not taking any weight loss meds or using  O2 at home.  Pt refused emmi video. 

## 2018-06-18 NOTE — Assessment & Plan Note (Signed)
I congratulated him on smoking cessation.  I encouraged him to use nicotine patches for at least the next 3 months to help with physical dependence symptoms He will meet criteria for lung cancer screening and we will make the referral.

## 2018-06-18 NOTE — Patient Instructions (Signed)
Schedule home sleep study.  Referral to lung cancer screening program

## 2018-06-20 ENCOUNTER — Other Ambulatory Visit: Payer: Self-pay | Admitting: Acute Care

## 2018-06-20 DIAGNOSIS — Z87891 Personal history of nicotine dependence: Secondary | ICD-10-CM

## 2018-06-20 DIAGNOSIS — Z122 Encounter for screening for malignant neoplasm of respiratory organs: Secondary | ICD-10-CM

## 2018-06-24 ENCOUNTER — Encounter: Payer: Self-pay | Admitting: Internal Medicine

## 2018-07-09 ENCOUNTER — Encounter: Payer: Self-pay | Admitting: Internal Medicine

## 2018-07-09 ENCOUNTER — Ambulatory Visit (AMBULATORY_SURGERY_CENTER): Payer: PPO | Admitting: Internal Medicine

## 2018-07-09 VITALS — BP 125/59 | HR 63 | Temp 98.4°F | Resp 16

## 2018-07-09 DIAGNOSIS — D120 Benign neoplasm of cecum: Secondary | ICD-10-CM | POA: Diagnosis not present

## 2018-07-09 DIAGNOSIS — E669 Obesity, unspecified: Secondary | ICD-10-CM | POA: Diagnosis not present

## 2018-07-09 DIAGNOSIS — Z86010 Personal history of colonic polyps: Secondary | ICD-10-CM

## 2018-07-09 DIAGNOSIS — I739 Peripheral vascular disease, unspecified: Secondary | ICD-10-CM | POA: Diagnosis not present

## 2018-07-09 DIAGNOSIS — I10 Essential (primary) hypertension: Secondary | ICD-10-CM | POA: Diagnosis not present

## 2018-07-09 DIAGNOSIS — D123 Benign neoplasm of transverse colon: Secondary | ICD-10-CM | POA: Diagnosis not present

## 2018-07-09 DIAGNOSIS — K219 Gastro-esophageal reflux disease without esophagitis: Secondary | ICD-10-CM | POA: Diagnosis not present

## 2018-07-09 DIAGNOSIS — G4733 Obstructive sleep apnea (adult) (pediatric): Secondary | ICD-10-CM | POA: Diagnosis not present

## 2018-07-09 DIAGNOSIS — E119 Type 2 diabetes mellitus without complications: Secondary | ICD-10-CM | POA: Diagnosis not present

## 2018-07-09 MED ORDER — SODIUM CHLORIDE 0.9 % IV SOLN: 500.0000 mL | Freq: Once | INTRAVENOUS | Status: DC

## 2018-07-09 NOTE — Progress Notes (Signed)
Pt's states no medical or surgical changes since previsit or office visit. 

## 2018-07-09 NOTE — Progress Notes (Signed)
Called to room to assist during endoscopic procedure.  Patient ID and intended procedure confirmed with present staff. Received instructions for my participation in the procedure from the performing physician.  

## 2018-07-09 NOTE — Progress Notes (Signed)
Report to PACU, RN, vss, BBS= Clear.  

## 2018-07-09 NOTE — Op Note (Addendum)
[facility] Patient Name: Richard [name] Procedure Date: 07/09/2018 7:38 AM MRN: [id number] Endoscopist: Docia Chuck. Henrene Pastor , MD Age: 68 Referring MD:  Date of Birth: 11-03-1950 Gender: Male Account #: [id number] Procedure:                Colonoscopy with cold snare polypectomy x3 Indications:              High risk colon cancer surveillance: Personal                            history of multiple (3 or more) adenomas. Previous                            examination December 2016 Medicines:                Monitored Anesthesia Care Procedure:                Pre-Anesthesia Assessment:                           - Prior to the procedure, a History and Physical                            was performed, and patient medications and                            allergies were reviewed. The patient's tolerance of                            previous anesthesia was also reviewed. The risks                            and benefits of the procedure and the sedation                            options and risks were discussed with the patient.                            All questions were answered, and informed consent                            was obtained. Prior Anticoagulants: The patient has                            taken no previous anticoagulant or antiplatelet                            agents. ASA Grade Assessment: II - A patient with                            mild systemic disease. After reviewing the risks                            and benefits, the patient was deemed in  satisfactory condition to undergo the procedure.                           After obtaining informed consent, the colonoscope                            was passed under direct vision. Throughout the                            procedure, the patient's blood pressure, pulse, and                            oxygen saturations were monitored continuously. The   Colonoscope was introduced through the anus and                            advanced to the the cecum, identified by                            appendiceal orifice and ileocecal valve. The                            ileocecal valve, appendiceal orifice, and rectum                            were photographed. The quality of the bowel                            preparation was excellent. The colonoscopy was                            performed without difficulty. The patient tolerated                            the procedure well. The bowel preparation used was                            SUPREP. Scope In: 8:46:39 AM Scope Out: 9:02:55 AM Scope Withdrawal Time: 0 hours 13 minutes 26 seconds  Total Procedure Duration: 0 hours 16 minutes 16 seconds  Findings:                 Three polyps were found in the transverse colon and                            cecum. The polyps were 2 to 9 mm in size. These                            polyps were removed with a cold snare. Resection                            and retrieval were complete.                           A few diverticula were found  in the sigmoid colon.                           Internal hemorrhoids were found during retroflexion.                           The exam was otherwise without abnormality on                            direct and retroflexion views. Complications:            No immediate complications. Estimated blood loss:                            None. Estimated Blood Loss:     Estimated blood loss: none. Impression:               - Three 2 to 9 mm polyps in the transverse colon                            and in the cecum, removed with a cold snare.                            Resected and retrieved.                           - Diverticulosis in the sigmoid colon.                           - Internal hemorrhoids.                           - The examination was otherwise normal on direct                            and  retroflexion views. Recommendation:           - Repeat colonoscopy in 3 years for surveillance.                           - Patient has a contact number available for                            emergencies. The signs and symptoms of potential                            delayed complications were discussed with the                            patient. Return to normal activities tomorrow.                            Written discharge instructions were provided to the                            patient.                           -  Resume previous diet.                           - Continue present medications.                           - Await pathology results. Docia Chuck. Henrene Pastor, MD 07/09/2018 9:09:31 AM This report has been signed electronically.

## 2018-07-09 NOTE — Patient Instructions (Signed)
YOU HAD AN ENDOSCOPIC PROCEDURE TODAY AT THE [facility]:   Refer to the procedure report that was given to you for any specific questions about what was found during the examination.  If the procedure report does not answer your questions, please call your gastroenterologist to clarify.  If you requested that your care partner not be given the details of your procedure findings, then the procedure report has been included in a sealed envelope for you to review at your convenience later.  YOU SHOULD EXPECT: Some feelings of bloating in the abdomen. Passage of more gas than usual.  Walking can help get rid of the air that was put into your GI tract during the procedure and reduce the bloating. If you had a lower endoscopy (such as a colonoscopy or flexible sigmoidoscopy) you may notice spotting of blood in your stool or on the toilet paper. If you underwent a bowel prep for your procedure, you may not have a normal bowel movement for a few days.  Please Note:  You might notice some irritation and congestion in your nose or some drainage.  This is from the oxygen used during your procedure.  There is no need for concern and it should clear up in a day or so.  SYMPTOMS TO REPORT IMMEDIATELY:   Following lower endoscopy (colonoscopy or flexible sigmoidoscopy):  Excessive amounts of blood in the stool  Significant tenderness or worsening of abdominal pains  Swelling of the abdomen that is new, acute  Fever of 100F or higher  For urgent or emergent issues, a gastroenterologist can be reached at any hour by calling [phone].   DIET:  We do recommend a small meal at first, but then you may proceed to your regular diet.  Drink plenty of fluids but you should avoid alcoholic beverages for 24 hours.  ACTIVITY:  You should plan to take it easy for the rest of today and you should NOT DRIVE or use heavy machinery until tomorrow (because of the sedation medicines used during the test).     FOLLOW UP: Our staff will call the number listed on your records the next business day following your procedure to check on you and address any questions or concerns that you may have regarding the information given to you following your procedure. If we do not reach you, we will leave a message.  However, if you are feeling well and you are not experiencing any problems, there is no need to return our call.  We will assume that you have returned to your regular daily activities without incident.  If any biopsies were taken you will be contacted by phone or by letter within the next 1-3 weeks.  Please call us at [phone] if you have not heard about the biopsies in 3 weeks.   Await for biopsy results Polyps (handout given) Diverticulosis (handout given) Hemorrhoids (handout given)   SIGNATURES/CONFIDENTIALITY: You and/or your care partner have signed paperwork which will be entered into your electronic medical record.  These signatures attest to the fact that that the information above on your After Visit Summary has been reviewed and is understood.  Full responsibility of the confidentiality of this discharge information lies with you and/or your care-partner. 

## 2018-07-10 ENCOUNTER — Telehealth: Payer: Self-pay

## 2018-07-10 DIAGNOSIS — G4733 Obstructive sleep apnea (adult) (pediatric): Secondary | ICD-10-CM | POA: Diagnosis not present

## 2018-07-10 NOTE — Telephone Encounter (Signed)
  Follow up Call-  Call back number 07/09/2018  Post procedure Call Back phone  # [phone]  Permission to leave phone message Yes  Some recent data might be hidden     Patient questions:  Do you have a fever, pain , or abdominal swelling? No. Pain Score  0 *  Have you tolerated food without any problems? Yes.    Have you been able to return to your normal activities? Yes.    Do you have any questions about your discharge instructions: Diet   No. Medications  No. Follow up visit  No.  Do you have questions or concerns about your Care? No.  Actions: * If pain score is 4 or above: No action needed, pain <4.  No problems noted per pt. maw

## 2018-07-11 ENCOUNTER — Other Ambulatory Visit: Payer: Self-pay | Admitting: *Deleted

## 2018-07-11 DIAGNOSIS — G4733 Obstructive sleep apnea (adult) (pediatric): Secondary | ICD-10-CM

## 2018-07-15 ENCOUNTER — Encounter: Payer: Self-pay | Admitting: Internal Medicine

## 2018-07-15 NOTE — Progress Notes (Signed)
Shared Decision Making Visit Lung Cancer Screening Program [phone])   Eligibility:  Age 68 y.o.  Pack Years Smoking History Calculation 70 pack year smoking history (# packs/per year x # years smoked)  Recent History of coughing up blood  no  Unexplained weight loss? no ( >Than 15 pounds within the last 6 months )  Prior History Lung / other cancer no (Diagnosis within the last 5 years already requiring surveillance chest CT Scans).  Smoking Status Former Smoker  Former Smokers: Years since quit: < 1 year  Quit Date: 06/08/2018  Visit Components:  Discussion included one or more decision making aids. yes  Discussion included risk/benefits of screening. yes  Discussion included potential follow up diagnostic testing for abnormal scans. yes  Discussion included meaning and risk of over diagnosis. yes  Discussion included meaning and risk of False Positives. yes  Discussion included meaning of total radiation exposure. yes  Counseling Included:  Importance of adherence to annual lung cancer LDCT screening. yes  Impact of comorbidities on ability to participate in the program. yes  Ability and willingness to under diagnostic treatment. yes  Smoking Cessation Counseling:  Current Smokers:   Discussed importance of smoking cessation. no  Information about tobacco cessation classes and interventions provided to patient. yes  Patient provided with "ticket" for LDCT Scan. yes  Symptomatic Patient. no  Counseling  Diagnosis Code: Tobacco Use Z72.0  Asymptomatic Patient yes  Counseling (Intermediate counseling: > three minutes counseling) [id number]  Former Smokers:   Discussed the importance of maintaining cigarette abstinence. yes  Diagnosis Code: Personal History of Nicotine Dependence. P53.614  Information about tobacco cessation classes and interventions provided to patient. Yes  Patient provided with "ticket" for LDCT Scan. yes  Written Order for Lung  Cancer Screening with LDCT placed in Epic. Yes (CT Chest Lung Cancer Screening Low Dose W/O CM) [id number] Z12.2-Screening of respiratory organs Z87.891-Personal history of nicotine dependence  I spent 25 minutes of face to face time with Richard [name] discussing the risks and benefits of lung cancer screening. We viewed a power point together that explained in detail the above noted topics. We took the time to pause the power point at intervals to allow for questions to be asked and answered to ensure understanding. We discussed that he had taken the single most powerful action possible to decrease his risk of developing lung cancer when he quit smoking. I counseled him to remain smoke free, and to contact me if he ever had the desire to smoke again so that I can provide resources and tools to help support the effort to remain smoke free. We discussed the time and location of the scan, and that either  Doroteo Glassman RN or I will call with the results within  24-48 hours of receiving them. He has my card and contact information in the event he needs to speak with me, in addition to a copy of the power point we reviewed as a resource. He verbalized understanding of all of the above and had no further questions upon leaving the office.     I explained to the patient that there has been a high incidence of coronary artery disease noted on these exams. I explained that this is a non-gated exam therefore degree or severity cannot be determined. This patient is currently on statin therapy. I have asked the patient to follow-up with their PCP regarding any incidental finding of coronary artery disease and management with diet or medication as they feel is  clinically indicated. The patient verbalized understanding of the above and had no further questions.    Richard Spatz, NP 07/16/2018 1:29 PM

## 2018-07-16 ENCOUNTER — Encounter: Payer: Self-pay | Admitting: Acute Care

## 2018-07-16 ENCOUNTER — Ambulatory Visit (INDEPENDENT_AMBULATORY_CARE_PROVIDER_SITE_OTHER)
Admission: RE | Admit: 2018-07-16 | Discharge: 2018-07-16 | Disposition: A | Discharge status: Home / Self Care | Payer: PPO | Source: Ambulatory Visit | Attending: Acute Care | Admitting: Acute Care

## 2018-07-16 ENCOUNTER — Ambulatory Visit (INDEPENDENT_AMBULATORY_CARE_PROVIDER_SITE_OTHER): Payer: PPO | Admitting: Acute Care

## 2018-07-16 ENCOUNTER — Ambulatory Visit (INDEPENDENT_AMBULATORY_CARE_PROVIDER_SITE_OTHER): Payer: PPO | Admitting: Adult Health

## 2018-07-16 ENCOUNTER — Encounter: Payer: Self-pay | Admitting: Adult Health

## 2018-07-16 VITALS — BP 126/80 | Temp 97.7°F | Ht 67.5 in | Wt 191.0 lb

## 2018-07-16 VITALS — BP 132/76 | HR 77 | Ht 68.0 in | Wt 190.8 lb

## 2018-07-16 DIAGNOSIS — E1329 Other specified diabetes mellitus with other diabetic kidney complication: Secondary | ICD-10-CM

## 2018-07-16 DIAGNOSIS — Z122 Encounter for screening for malignant neoplasm of respiratory organs: Secondary | ICD-10-CM

## 2018-07-16 DIAGNOSIS — Z720 Tobacco use: Secondary | ICD-10-CM

## 2018-07-16 DIAGNOSIS — Z87891 Personal history of nicotine dependence: Secondary | ICD-10-CM

## 2018-07-16 DIAGNOSIS — Z125 Encounter for screening for malignant neoplasm of prostate: Secondary | ICD-10-CM | POA: Diagnosis not present

## 2018-07-16 DIAGNOSIS — K2100 Gastro-esophageal reflux disease with esophagitis, without bleeding: Secondary | ICD-10-CM

## 2018-07-16 DIAGNOSIS — I10 Essential (primary) hypertension: Secondary | ICD-10-CM

## 2018-07-16 DIAGNOSIS — Z23 Encounter for immunization: Secondary | ICD-10-CM | POA: Diagnosis not present

## 2018-07-16 DIAGNOSIS — Z0000 Encounter for general adult medical examination without abnormal findings: Secondary | ICD-10-CM

## 2018-07-16 LAB — COMPREHENSIVE METABOLIC PANEL
ALK PHOS: 74 U/L (ref 39–117)
ALT: 13 U/L (ref 0–53)
AST: 16 U/L (ref 0–37)
Albumin: 4.4 g/dL (ref 3.5–5.2)
BUN: 15 mg/dL (ref 6–23)
CALCIUM: 9.8 mg/dL (ref 8.4–10.5)
CO2: 28 mEq/L (ref 19–32)
Chloride: 100 mEq/L (ref 96–112)
Creatinine, Ser: 1.35 mg/dL (ref 0.40–1.50)
GFR: 56.36 mL/min — ABNORMAL LOW (ref >60.00)
Glucose, Bld: 104 mg/dL — ABNORMAL HIGH (ref 70–99)
Potassium: 4.7 mEq/L (ref 3.5–5.1)
Sodium: 134 mEq/L — ABNORMAL LOW (ref 135–145)
TOTAL PROTEIN: 7.2 g/dL (ref 6.0–8.3)
Total Bilirubin: 0.8 mg/dL (ref 0.2–1.2)

## 2018-07-16 LAB — CBC WITH DIFFERENTIAL/PLATELET
Basophils Absolute: 0.1 10*3/uL (ref 0.0–0.1)
Basophils Relative: 0.7 % (ref 0.0–3.0)
Eosinophils Absolute: 0.3 10*3/uL (ref 0.0–0.7)
Eosinophils Relative: 4.7 % (ref 0.0–5.0)
HCT: 42.7 % (ref 39.0–52.0)
Hemoglobin: 14.9 g/dL (ref 13.0–17.0)
Lymphocytes Relative: 27.2 % (ref 12.0–46.0)
Lymphs Abs: 2 10*3/uL (ref 0.7–4.0)
MCHC: 34.9 g/dL (ref 30.0–36.0)
MCV: 103.6 fl — ABNORMAL HIGH (ref 78.0–100.0)
Monocytes Absolute: 0.7 10*3/uL (ref 0.1–1.0)
Monocytes Relative: 9.2 % (ref 3.0–12.0)
Neutro Abs: 4.3 10*3/uL (ref 1.4–7.7)
Neutrophils Relative %: 58.2 % (ref 43.0–77.0)
Platelets: 226 10*3/uL (ref 150.0–400.0)
RBC: 4.12 Mil/uL — ABNORMAL LOW (ref 4.22–5.81)
RDW: 12.5 % (ref 11.5–15.5)
WBC: 7.3 10*3/uL (ref 4.0–10.5)

## 2018-07-16 LAB — LIPID PANEL
CHOLESTEROL: 140 mg/dL (ref 0–200)
HDL: 54.6 mg/dL (ref >39.00)
LDL Cholesterol: 70 mg/dL (ref 0–99)
NonHDL: 85.6
Total CHOL/HDL Ratio: 3
Triglycerides: 76 mg/dL (ref 0.0–149.0)
VLDL: 15.2 mg/dL (ref 0.0–40.0)

## 2018-07-16 LAB — PSA: PSA: 1.32 ng/mL (ref 0.10–4.00)

## 2018-07-16 LAB — TSH: TSH: 1.84 u[IU]/mL (ref 0.35–4.50)

## 2018-07-16 LAB — HEMOGLOBIN A1C: Hgb A1c MFr Bld: 5.8 % (ref 4.6–6.5)

## 2018-07-16 MED ORDER — LISINOPRIL 40 MG PO TABS: 40.0000 mg | ORAL_TABLET | Freq: Every day | ORAL | 3 refills | Status: DC

## 2018-07-16 MED ORDER — AMLODIPINE BESYLATE 5 MG PO TABS: ORAL_TABLET | ORAL | 3 refills | Status: DC

## 2018-07-16 MED ORDER — METFORMIN HCL 500 MG PO TABS: 500.0000 mg | ORAL_TABLET | Freq: Every day | ORAL | 1 refills | Status: DC

## 2018-07-16 MED ORDER — OMEPRAZOLE 20 MG PO CPDR: 20.0000 mg | DELAYED_RELEASE_CAPSULE | Freq: Every day | ORAL | 3 refills | Status: DC

## 2018-07-16 NOTE — Progress Notes (Signed)
Subjective:    Patient ID: Richard [name], male    DOB: [birth year], 68 y.o.   MRN: [id number]  HPI  Patient presents for yearly preventative medicine examination. She is a pleasant 68 year old male who  has a past medical history of ANEMIA-NOS (12/06/2008), BACK PAIN (02/20/2010), DIABETES MELLITUS, TYPE II (06/01/2008), GERD (06/01/2008), HYPERLIPIDEMIA (06/01/2008), HYPERTENSION (06/01/2008), and Tremors of nervous system.  Essential Hypertension -controlled with Norvasc 5 mg and lisinopril 40mg  BP Readings from Last 3 Encounters:  07/16/18 126/80  07/09/18 (!) 125/59  06/18/18 122/80    DM -Currently prescribed Metformin 500 mg QHS Lab Results  Component Value Date   HGBA1C 5.3 05/16/2018    GERD - Controlled with Prilosec   Tobacco Use - Reports that he quit on December 8th. He has handled this pretty well. Denies withdrawal symptoms   Hyperlipidemia -takes ASA and lipitor  Lab Results  Component Value Date   CHOL 126 05/30/2017   HDL 41.60 05/30/2017   LDLCALC 72 05/30/2017   TRIG 61.0 05/30/2017   CHOLHDL 3 05/30/2017    Back Pain - h/o history of spinal stenosis.  Has trouble with activities due to discomfort this causes. He is careful to avoid Nsaids. Has been seen by orthopedics   All immunizations and health maintenance protocols were reviewed with the patient and needed orders were placed. Needs Prevnar 13. Had his first shingles vaccination at [facility].   Appropriate screening laboratory values were ordered for the patient including screening of hyperlipidemia, renal function and hepatic function. If indicated by BPH, a PSA was ordered.  Medication reconciliation,  past medical history, social history, problem list and allergies were reviewed in detail with the patient  Goals were established with regard to weight loss, exercise, and  diet in compliance with medications. Does not eat a heart healthy diet. His chronic back pain makes it difficult for him to  exercise  He is up-to-date on routine colonoscopy and dental exams.  Review of Systems  Constitutional: Negative.   HENT: Negative.   Eyes: Negative.   Respiratory: Negative.   Cardiovascular: Negative.   Gastrointestinal: Negative.   Endocrine: Negative.   Genitourinary: Negative.   Musculoskeletal: Positive for arthralgias and back pain.  Skin: Negative.   Allergic/Immunologic: Negative.   Neurological: Negative.   Hematological: Negative.   Psychiatric/Behavioral: Negative.   All other systems reviewed and are negative.      Objective:   Physical Exam Vitals signs and nursing note reviewed.  Constitutional:      General: He is not in acute distress.    Appearance: He is well-developed. He is not diaphoretic.  HENT:     Head: Normocephalic and atraumatic.     Right Ear: External ear normal.     Left Ear: External ear normal.     Nose: Nose normal.     Mouth/Throat:     Mouth: Mucous membranes are moist.     Dentition: Has dentures.     Pharynx: Oropharynx is clear. No oropharyngeal exudate.  Eyes:     General:        Right eye: No discharge.        Left eye: No discharge.     Extraocular Movements: Extraocular movements intact.     Conjunctiva/sclera: Conjunctivae normal.     Pupils: Pupils are equal, round, and reactive to light.  Neck:     Thyroid: No thyromegaly.     Trachea: No tracheal deviation.  Cardiovascular:  Rate and Rhythm: Normal rate and regular rhythm.     Heart sounds: Normal heart sounds. No murmur. No friction rub. No gallop.   Pulmonary:     Effort: Pulmonary effort is normal. No respiratory distress.     Breath sounds: Normal breath sounds. No stridor. No wheezing, rhonchi or rales.  Chest:     Chest wall: No tenderness.  Abdominal:     General: Bowel sounds are normal. There is no distension.     Palpations: Abdomen is soft. There is no mass.     Tenderness: There is no abdominal tenderness. There is no left CVA tenderness, guarding  or rebound.     Hernia: No hernia is present.  Genitourinary:    Comments: Will do PSA Musculoskeletal: Normal range of motion.  Lymphadenopathy:     Cervical: No cervical adenopathy.  Skin:    General: Skin is warm and dry.     Capillary Refill: Capillary refill takes less than 2 seconds.     Coloration: Skin is not jaundiced or pale.     Findings: No bruising, erythema, lesion or rash.  Neurological:     General: No focal deficit present.     Mental Status: He is alert and oriented to person, place, and time.     Cranial Nerves: No cranial nerve deficit.     Sensory: No sensory deficit.     Motor: No weakness.     Coordination: Coordination normal.     Gait: Gait normal.     Deep Tendon Reflexes: Reflexes normal.  Psychiatric:        Mood and Affect: Mood normal.        Behavior: Behavior normal.        Thought Content: Thought content normal.        Judgment: Judgment normal.       Assessment & Plan:  1. Diabetes mellitus of other type with other kidney complication, unspecified whether long term insulin use (HCC) - Well controlled in the past.  - Will have him follow up in 6 months or sooner if needed - CBC with Differential/Platelet - Hemoglobin A1c - Comprehensive metabolic panel - Lipid panel - TSH - PSA - metFORMIN (GLUCOPHAGE) 500 MG tablet; Take 1 tablet (500 mg total) by mouth at bedtime.  Dispense: 90 tablet; Refill: 1  2. Routine general medical examination at a health care facility - Follow up in one year for CPE or sooner if needed  - CBC with Differential/Platelet - Hemoglobin A1c - Comprehensive metabolic panel - Lipid panel - TSH - PSA  3. Prostate cancer screening  - PSA  4. Essential hypertension - Well controlled. No change in medications  - CBC with Differential/Platelet - Hemoglobin A1c - Comprehensive metabolic panel - Lipid panel - TSH - PSA - amLODipine (NORVASC) 5 MG tablet; TAKE 1 TABLET(5 MG) BY MOUTH DAILY  Dispense: 90  tablet; Refill: 3 - lisinopril (PRINIVIL,ZESTRIL) 40 MG tablet; Take 1 tablet (40 mg total) by mouth daily.  Dispense: 90 tablet; Refill: 3  5. Tobacco use - Congratulated on quitting smoking  - Has lung cancer screen later today   6. Gastroesophageal reflux disease with esophagitis  - omeprazole (PRILOSEC) 20 MG capsule; Take 1 capsule (20 mg total) by mouth daily.  Dispense: 90 capsule; Refill: 3  7. Need for vaccination against Streptococcus pneumoniae  - Pneumococcal conjugate vaccine 13-valent  [name]

## 2018-07-17 ENCOUNTER — Telehealth: Payer: Self-pay | Admitting: Acute Care

## 2018-07-17 NOTE — Telephone Encounter (Signed)
Dr. Elsworth Soho, Here are the results of Richard [name]'s LDCT. You follow him for OSA. You saw him last 06/2018. There was an incidental finding of evidence of ILD/ UIP  on his LDCT 07/2018. Marland Kitchen Per your request I am forwarding this to you ,so that you can schedule the patient for a  follow up appointment and further management. Thanks so much.   IMPRESSION: 1. Lung-RADS 2S, benign appearance or behavior. Continue annual screening with low-dose chest CT without contrast in 12 months. 2. The "S" modifier above refers to potentially clinically significant non lung cancer related findings. Specifically, there is aortic atherosclerosis, in addition to left main and 2 vessel coronary artery disease. Please note that although the presence of coronary artery calcium documents the presence of coronary artery disease, the severity of this disease and any potential stenosis cannot be assessed on this non-gated CT examination. Assessment for potential risk factor modification, dietary therapy or pharmacologic therapy may be warranted, if clinically indicated. 3. There is also evidence of interstitial lung disease, with imaging findings highly concerning for usual interstitial pneumonia. Nonemergent outpatient evaluation by Pulmonology is recommended in the near future. 4. Mild diffuse bronchial wall thickening with mild centrilobular and paraseptal emphysema; imaging findings suggestive of underlying COPD.  Aortic Atherosclerosis (ICD10-I70.0) and Emphysema (ICD10-J43.9).

## 2018-07-17 NOTE — Telephone Encounter (Signed)
Please arrange for follow-up 1 week after his home sleep study

## 2018-07-18 ENCOUNTER — Other Ambulatory Visit: Payer: Self-pay | Admitting: Acute Care

## 2018-07-18 DIAGNOSIS — G4733 Obstructive sleep apnea (adult) (pediatric): Secondary | ICD-10-CM | POA: Diagnosis not present

## 2018-07-18 DIAGNOSIS — Z122 Encounter for screening for malignant neoplasm of respiratory organs: Secondary | ICD-10-CM

## 2018-07-18 DIAGNOSIS — Z87891 Personal history of nicotine dependence: Secondary | ICD-10-CM

## 2018-07-18 NOTE — Telephone Encounter (Signed)
Spoke with patient. He has been scheduled for 07/23/18 at 1015am. Will make a copy if his results and place in my look-at folder. Will send the original study to scan.   Nothing further needed at time of call.

## 2018-07-23 ENCOUNTER — Encounter: Payer: Self-pay | Admitting: Pulmonary Disease

## 2018-07-23 ENCOUNTER — Ambulatory Visit (INDEPENDENT_AMBULATORY_CARE_PROVIDER_SITE_OTHER): Payer: PPO | Admitting: Pulmonary Disease

## 2018-07-23 DIAGNOSIS — R911 Solitary pulmonary nodule: Secondary | ICD-10-CM | POA: Diagnosis not present

## 2018-07-23 DIAGNOSIS — J84112 Idiopathic pulmonary fibrosis: Secondary | ICD-10-CM | POA: Insufficient documentation

## 2018-07-23 DIAGNOSIS — G4733 Obstructive sleep apnea (adult) (pediatric): Secondary | ICD-10-CM

## 2018-07-23 LAB — SEDIMENTATION RATE: Sed Rate: 8 mm/hr (ref 0–20)

## 2018-07-23 NOTE — Progress Notes (Signed)
   Subjective:    Patient ID: MARCIO HOQUE, male    DOB: 01/08/1951, 68 y.o.   MRN: [id number]  HPI  68 year old heavy smoker for FU of OSA & abnormal CT He is a diabetic hypertensive and has hyperlipidemia. He has familial tremors which are quite severe. He smoked about 2 packs/day until he quit 06/2018.  He still has the urge and uses a straw to bite on whenever he feels.  We reviewed results of sleep study which showed mild OSA predominantly in the supine position.  He reports loud snoring, but feels rested on waking up, wife has moved out of the bedroom  We also reviewed results of low-dose CT scan in detail, implications of nodule findings in the lungs and also fibrosis  Significant tests/ events reviewed  HST 07/2018 >> mild OSA, AHI 12/hour, lowest desaturation 78%, worse in the supine position  LDCT 07/2018 >> changes of emphysema, distribution of fibrosis peripheral and basilar suggestive of IPF, multiple nodules, largest 5 mm nodule anterior aspect of left lower lobe   Past Medical History:  Diagnosis Date  . ANEMIA-NOS 12/06/2008  . BACK PAIN 02/20/2010  . DIABETES MELLITUS, TYPE II 06/01/2008  . GERD 06/01/2008  . HYPERLIPIDEMIA 06/01/2008  . HYPERTENSION 06/01/2008  . Tremors of nervous system    pt states he has had tremors most of his life in his hands/hereditary    Review of Systems neg for any significant sore throat, dysphagia, itching, sneezing, nasal congestion or excess/ purulent secretions, fever, chills, sweats, unintended wt loss, pleuritic or exertional cp, hempoptysis, orthopnea pnd or change in chronic leg swelling. Also denies presyncope, palpitations, heartburn, abdominal pain, nausea, vomiting, diarrhea or change in bowel or urinary habits, dysuria,hematuria, rash, arthralgias, visual complaints, headache, numbness weakness or ataxia.     Objective:   Physical Exam   Gen. Pleasant, well-nourished, in no distress, normal affect ENT - no pallor,icterus,  no post nasal drip Neck: No JVD, no thyromegaly, no carotid bruits Lungs: no use of accessory muscles, no dullness to percussion, bibasal rales no  rhonchi  Cardiovascular: Rhythm regular, heart sounds  normal, no murmurs or gallops, no peripheral edema Abdomen: soft and non-tender, no hepatosplenomegaly, BS normal. Musculoskeletal: No deformities, no cyanosis or clubbing Neuro:  alert, non focal        Assessment & Plan:

## 2018-07-23 NOTE — Assessment & Plan Note (Signed)
We also discussed sleep study which showed mild obstructive sleep apnea. Trial of auto CPAP 5 to 15 cm with nasal pillows, usage of at least 4 to 6 hours every night would be expected We discussed that cardiovascular implications of mild OSA is minimal  Weight loss encouraged, compliance with goal of at least 4-6 hrs every night is the expectation. Advised against medications with sedative side effects Cautioned against driving when sleepy - understanding that sleepiness will vary on a day to day basis

## 2018-07-23 NOTE — Addendum Note (Signed)
Addended by: Suzzanne Cloud E on: 07/23/2018 11:04 AM   Modules accepted: Orders

## 2018-07-23 NOTE — Assessment & Plan Note (Signed)
We discussed CAT scan findings which show tiny nodules and pulmonary fibrosis more at the bottom neck and periphery of the lungs. Radiologically favor IPF  Blood work today-CCP, ESR, ANA, ACE level  Schedule full PFTs.

## 2018-07-23 NOTE — Patient Instructions (Signed)
We discussed CAT scan findings which show tiny nodules and pulmonary fibrosis more at the bottom neck and periphery of the lungs.  Blood work today.  Schedule full PFTs.  We also discussed sleep study which showed mild obstructive sleep apnea. Trial of auto CPAP 5 to 15 cm with nasal pillows, usage of at least 4 to 6 hours every night would be expected

## 2018-07-23 NOTE — Assessment & Plan Note (Signed)
Noted on low-dose CT, these are too tiny to clarify. We will need follow-up CT in 6 months and given has probably fibrosis would prefer a high-resolution CT scan

## 2018-07-24 LAB — CYCLIC CITRUL PEPTIDE ANTIBODY, IGG: Cyclic Citrullin Peptide Ab: <16 UNITS

## 2018-07-24 LAB — ANA: Anti Nuclear Antibody(ANA): NEGATIVE

## 2018-07-24 LAB — ANGIOTENSIN CONVERTING ENZYME: Angiotensin-Converting Enzyme: <1 U/L — ABNORMAL LOW (ref 9–67)

## 2018-07-29 ENCOUNTER — Ambulatory Visit (INDEPENDENT_AMBULATORY_CARE_PROVIDER_SITE_OTHER): Payer: PPO | Admitting: Family Medicine

## 2018-07-29 ENCOUNTER — Encounter: Payer: Self-pay | Admitting: Family Medicine

## 2018-07-29 VITALS — BP 130/80 | HR 72 | Temp 97.6°F | Ht 68.0 in | Wt 194.2 lb

## 2018-07-29 DIAGNOSIS — M25522 Pain in left elbow: Secondary | ICD-10-CM | POA: Diagnosis not present

## 2018-07-29 NOTE — Progress Notes (Signed)
HPI:  Using dictation device. Unfortunately this device frequently misinterprets words/phrases.  Acute visit for L elbow pain: -reports started about 1.5 weeks ago -denies fever, malaise, worsening swelling, redness, radiation, weakness, numbess -reports pain and he thinks swelling in the L olecranon area that is worse with flexion and extension of the elbow -reports had this happen in the R elbow and had to have a tap and then it recurred and he had to have the bursa removed - saw [facility] for this -reports diabetes well controlled -took some ibuprofen but it did not help, ice did not help, heat helps some -reports he want [place] to help him get in with his orthopedic doc  ROS: See pertinent positives and negatives per HPI.  Past Medical History:  Diagnosis Date  . ANEMIA-NOS 12/06/2008  . BACK PAIN 02/20/2010  . DIABETES MELLITUS, TYPE II 06/01/2008  . GERD 06/01/2008  . HYPERLIPIDEMIA 06/01/2008  . HYPERTENSION 06/01/2008  . Tremors of nervous system    pt states he has had tremors most of his life in his hands/hereditary    Past Surgical History:  Procedure Laterality Date  . COLONOSCOPY    . ELBOW BURSA SURGERY     right elbow  . HAND SURGERY     2 surgeries on right hand  . HAND SURGERY     left hand/removed a bone    Family History  Problem Relation Age of Onset  . Diabetes Sister   . Alzheimer's disease Sister   . Diabetes Brother   . Heart disease Mother   . Sudden death Father        Suicide after his wife passed away   . Colon cancer Neg Hx     SOCIAL HX: see hpi   Current Outpatient Medications:  .  ACCU-CHEK AVIVA test strip, USE ONCE DAILY AND AS NEEDED, Disp: 100 each, Rfl: 3 .  amLODipine (NORVASC) 5 MG tablet, TAKE 1 TABLET(5 MG) BY MOUTH DAILY, Disp: 90 tablet, Rfl: 3 .  atorvastatin (LIPITOR) 40 MG tablet, TAKE 1 TABLET(40 MG) BY MOUTH DAILY, Disp: 90 tablet, Rfl: 3 .  Lancets (ACCU-CHEK MULTICLIX) lancets, 1 each by Other route daily. Use as  instructed , Disp: , Rfl:  .  lisinopril (PRINIVIL,ZESTRIL) 40 MG tablet, Take 1 tablet (40 mg total) by mouth daily., Disp: 90 tablet, Rfl: 3 .  metFORMIN (GLUCOPHAGE) 500 MG tablet, Take 1 tablet (500 mg total) by mouth at bedtime., Disp: 90 tablet, Rfl: 1 .  omeprazole (PRILOSEC) 20 MG capsule, Take 1 capsule (20 mg total) by mouth daily., Disp: 90 capsule, Rfl: 3 .  sildenafil (VIAGRA) 100 MG tablet, Take 1 tablet (100 mg total) by mouth daily as needed for erectile dysfunction., Disp: 10 tablet, Rfl: 6  Current Facility-Administered Medications:  .  0.9 %  sodium chloride infusion, 500 mL, Intravenous, Once, Irene Shipper, MD .  aspirin chewable tablet 81 mg, 81 mg, Oral, Once, Marletta Lor, MD  EXAM:  Vitals:   07/29/18 1335  BP: 130/80  Pulse: 72  Temp: 97.6 F (36.4 C)    Body mass index is 29.53 kg/m.  GENERAL: vitals reviewed and listed above, alert, oriented, appears well hydrated and in no acute distress  HEENT: atraumatic, conjunttiva clear, no obvious abnormalities on inspection of external nose and ears  NECK: no obvious masses on inspection  MS: moves all extremities without noticeable abnormality, gait normal, no redness or warmth of L elbow, normal ROM L elbow, TTP over  olecranon and ext tendons, ? Minimal edema, no sig appreciable fluctuance, NV intact distal with strength preserve throughout in the upper extermities  PSYCH: pleasant and cooperative, no obvious depression or anxiety  ASSESSMENT AND PLAN:  Discussed the following assessment and plan:  Left elbow pain  -we discussed possible serious and likely etiologies, workup and treatment, treatment risks and return precautions -after this discussion, Arber opted for evaluation at his orthopedic office with their urgent care option - he decided to go there today. May benefit from [place] to confirm dx, aleve per instructions, ice in interim.  -follow up advised here as needed if any difficulty seeing  his ortho option -of course, we advised Emanuelle  to return or notify a doctor immediately if symptoms worsen or persist or new concerns arise.   There are no Patient Instructions on file for this visit.  Lucretia Kern, DO

## 2018-08-01 DIAGNOSIS — G4733 Obstructive sleep apnea (adult) (pediatric): Secondary | ICD-10-CM | POA: Diagnosis not present

## 2018-08-20 ENCOUNTER — Encounter: Payer: Self-pay | Admitting: Internal Medicine

## 2018-08-28 DIAGNOSIS — M7022 Olecranon bursitis, left elbow: Secondary | ICD-10-CM | POA: Diagnosis not present

## 2018-08-28 DIAGNOSIS — M25522 Pain in left elbow: Secondary | ICD-10-CM | POA: Diagnosis not present

## 2018-08-30 DIAGNOSIS — G4733 Obstructive sleep apnea (adult) (pediatric): Secondary | ICD-10-CM | POA: Diagnosis not present

## 2018-09-07 NOTE — Progress Notes (Signed)
@Patient  ID: Richard [name], male    DOB: 1950-12-06, 68 y.o.   MRN: [id number]  Chief Complaint  Patient presents with  . Follow-up    to discuss PFT and CPAP (has questions)    Referring provider: [name], NP  HPI:  67 year old male former smoker followed in our office for obstructive sleep apnea as well as an abnormal CT showing UIP pattern ILD and lung nodules   PMH: Diabetic, hypertension, hyperlipidemia Smoker/ Smoking History: Former smoker.  Quit December/2019.  70-pack-year smoking history.  January/2020 lung cancer screening CT showing a lung RADS 2S. Maintenance:  None Pt of: Dr. Elsworth Soho  09/08/2018  - Visit   68 year old male former smoker followed in our office for obstructive sleep apnea as well as a recent abnormal lung cancer screening CT that shows UIP pattern ILD as well as scattered pulmonary nodules.  Patient presented to our office today after completing pulmonary function test.  Patient with home sleep study in January/2020 that shows mild obstructive sleep apnea with an AHI of 12.  Patient has been adherent to using his CPAP per compliance download today.  Compliance download listed below:  CPAP compliance report: 30 out of last 30 days use, 29 of those days greater than 4 hours, average usage 7 hours and 2 minutes, APAP settings 5-15, AHI 0.9.  Patient reporting that he would like to stop CPAP as he is noticed no clinical change since starting to use it.  He continues to snore.  Pulmonary function test completed today.  Pulmonary function test results listed below  09/08/2018-pulmonary function test-FVC 3.34 (79% predicted), postbronchodilator ratio 85, postbronchodilator FEV1 2.84 (90% predicted), no positive bronchodilator response, DLCO 67   Tests:   HST 07/2018 >> mild OSA, AHI 12/hour, lowest desaturation 78%, worse in the supine position  LDCT 07/2018 >> lung rads 2S - changes of emphysema, distribution of fibrosis peripheral and basilar suggestive  of IPF, multiple nodules, largest 5 mm nodule anterior aspect of left lower lobe  09/08/2018-pulmonary function test-FVC 3.34 (79% predicted), postbronchodilator ratio 85, postbronchodilator FEV1 2.84 (90% predicted), no positive bronchodilator response, DLCO 67  07/23/2018-sed rate-8 07/23/2018- ACE-less than 1 07/23/2018-CCP-less than 16 07/23/2018- ANA-negative  SIX MIN WALK 09/08/2018  Supplimental Oxygen during Test? (L/min) No  Tech Comments: moderate walking pace - no complaints of SOB - no desaturation with SPO2 remaining at 100%.  [name] RN    FENO:  No results found for: NITRICOXIDE  PFT: PFT Results Latest Ref Rng & Units 09/08/2018  FVC-Pre L 3.34  FVC-Predicted Pre % 79  FVC-Post L 3.33  FVC-Predicted Post % 79  Pre FEV1/FVC % % 86  Post FEV1/FCV % % 85  FEV1-Pre L 2.86  FEV1-Predicted Pre % 91  FEV1-Post L 2.84  DLCO UNC% % 67  DLCO COR %Predicted % 78  TLC L 5.34  TLC % Predicted % 82  RV % Predicted % 85    Imaging: No results found.    Specialty Problems      Pulmonary Problems   OSA (obstructive sleep apnea)    HST 07/2018 >> mild OSA, AHI 12/hour, lowest desaturation 78%, worse in the supine position      IPF (idiopathic pulmonary fibrosis) (HCC)    LDCT 07/2018 >> lung rads 2S - changes of emphysema, distribution of fibrosis peripheral and basilar suggestive of IPF, multiple nodules, largest 5 mm nodule anterior aspect of left lower lobe  09/08/2018-pulmonary function test-FVC 3.34 (79% predicted), postbronchodilator ratio 85, postbronchodilator  FEV1 2.84 (90% predicted), no positive bronchodilator response, DLCO 67  07/23/2018-sed rate-8 07/23/2018- ACE-less than 1 07/23/2018-CCP-less than 16 07/23/2018- ANA-negative      Pulmonary nodule      No Known Allergies  Immunization History  Administered Date(s) Administered  . Influenza,inj,Quad PF,6+ Mos 04/05/2015, 05/01/2016, 05/16/2018  . Influenza-Unspecified 04/22/2017  . Pneumococcal  Conjugate-13 07/16/2018  . Pneumococcal Polysaccharide-23 05/30/2017  . Td 07/02/2005  . Zoster Recombinat (Shingrix) 06/19/2018    Past Medical History:  Diagnosis Date  . ANEMIA-NOS 12/06/2008  . BACK PAIN 02/20/2010  . DIABETES MELLITUS, TYPE II 06/01/2008  . GERD 06/01/2008  . HYPERLIPIDEMIA 06/01/2008  . HYPERTENSION 06/01/2008  . Tremors of nervous system    pt states he has had tremors most of his life in his hands/hereditary    Tobacco History: Social History   Tobacco Use  Smoking Status Former Smoker  . Packs/day: 2.00  . Years: 35.00  . Pack years: 70.00  . Types: Cigarettes  . Last attempt to quit: 06/08/2018  . Years since quitting: 0.2  Smokeless Tobacco Never Used   Counseling given: Not Answered  Continue to not smoke  Outpatient Encounter Medications as of 09/08/2018  Medication Sig  . ACCU-CHEK AVIVA test strip USE ONCE DAILY AND AS NEEDED  . amLODipine (NORVASC) 5 MG tablet TAKE 1 TABLET(5 MG) BY MOUTH DAILY  . atorvastatin (LIPITOR) 40 MG tablet TAKE 1 TABLET(40 MG) BY MOUTH DAILY  . Lancets (ACCU-CHEK MULTICLIX) lancets 1 each by Other route daily. Use as instructed   . lisinopril (PRINIVIL,ZESTRIL) 40 MG tablet Take 1 tablet (40 mg total) by mouth daily.  . metFORMIN (GLUCOPHAGE) 500 MG tablet Take 1 tablet (500 mg total) by mouth at bedtime.  Marland Kitchen omeprazole (PRILOSEC) 20 MG capsule Take 1 capsule (20 mg total) by mouth daily.  . sildenafil (VIAGRA) 100 MG tablet Take 1 tablet (100 mg total) by mouth daily as needed for erectile dysfunction.   Facility-Administered Encounter Medications as of 09/08/2018  Medication  . 0.9 %  sodium chloride infusion  . aspirin chewable tablet 81 mg     Review of Systems  Review of Systems  Constitutional: Positive for fatigue. Negative for fever.  HENT: Positive for congestion. Negative for rhinorrhea.   Respiratory: Positive for cough. Negative for shortness of breath and wheezing.   Cardiovascular: Negative for  chest pain and palpitations.  Gastrointestinal: Negative for diarrhea, nausea and vomiting.     Physical Exam  BP 136/70 (BP Location: Left Arm, Cuff Size: Normal)   Pulse 80   SpO2 99%   Wt Readings from Last 5 Encounters:  07/29/18 194 lb 3.2 oz (88.1 kg)  07/23/18 190 lb 12.8 oz (86.5 kg)  07/16/18 190 lb 12.8 oz (86.5 kg)  07/16/18 191 lb (86.6 kg)  06/18/18 187 lb (84.8 kg)    Physical Exam  Constitutional: He is oriented to person, place, and time and well-developed, well-nourished, and in no distress. No distress.  HENT:  Head: Normocephalic and atraumatic.  Right Ear: Hearing, tympanic membrane, external ear and ear canal normal.  Left Ear: Hearing, tympanic membrane, external ear and ear canal normal.  Nose: Mucosal edema present. Right sinus exhibits no maxillary sinus tenderness and no frontal sinus tenderness. Left sinus exhibits no maxillary sinus tenderness and no frontal sinus tenderness.  Mouth/Throat: Uvula is midline and oropharynx is clear and moist. Abnormal dentition. No oropharyngeal exudate.  Eyes: Pupils are equal, round, and reactive to light.  Neck: Normal range of  motion. Neck supple.  Cardiovascular: Normal rate, regular rhythm and normal heart sounds.  Pulmonary/Chest: Effort normal and breath sounds normal. No accessory muscle usage. No respiratory distress. He has no decreased breath sounds. He has no wheezes. He has no rhonchi.  Musculoskeletal: Normal range of motion.        General: No edema.  Lymphadenopathy:    He has no cervical adenopathy.  Neurological: He is alert and oriented to person, place, and time. Gait normal.  Skin: Skin is warm and dry. He is not diaphoretic. No erythema.  Psychiatric: Mood, memory, affect and judgment normal.  Nursing note and vitals reviewed.     Lab Results:  CBC    Component Value Date/Time   WBC 7.3 07/16/2018 0759   RBC 4.12 (L) 07/16/2018 0759   HGB 14.9 07/16/2018 0759   HCT 42.7 07/16/2018 0759    PLT 226.0 07/16/2018 0759   MCV 103.6 (H) 07/16/2018 0759   MCHC 34.9 07/16/2018 0759   RDW 12.5 07/16/2018 0759   LYMPHSABS 2.0 07/16/2018 0759   MONOABS 0.7 07/16/2018 0759   EOSABS 0.3 07/16/2018 0759   BASOSABS 0.1 07/16/2018 0759    BMET    Component Value Date/Time   NA 134 (L) 07/16/2018 0759   NA 143 11/12/2013   K 4.7 07/16/2018 0759   CL 100 07/16/2018 0759   CO2 28 07/16/2018 0759   GLUCOSE 104 (H) 07/16/2018 0759   BUN 15 07/16/2018 0759   BUN 14 11/12/2013   CREATININE 1.35 07/16/2018 0759   CALCIUM 9.8 07/16/2018 0759   GFRNONAA 69.80 11/30/2009 0851    BNP No results found for: BNP  ProBNP No results found for: PROBNP    Assessment & Plan:     OSA (obstructive sleep apnea) Assessment: Home sleep study in January/2020 shows mild obstructive sleep apnea, AHI 12 CPAP compliance report shows excellent compliance as well as an AHI of 0.9   Plan: Continue to use CPAP Patient continues to struggle with CPAP use and could consider referral for oral appliance If patient has any mask issues could consider referral to [facility] for tach and mask fitting  IPF (idiopathic pulmonary fibrosis) ([place]) Assessment: January lung cancer screening CT shows UIP Pulmonary function test today shows no formal obstruction, DLCO 67 Walk test in office patient completed laps without oxygen desaturations January/2020 connective tissue disease work-up was negative  Plan: Continue forward with planned high-resolution CT in July/2020  Tobacco use Assessment: 70-pack-year smoking history Quit smoking December/2019  Plan: We will order high-resolution CT for further evaluation of fibrosis as well as pulmonary nodules Can consider resuming lung cancer screening CT, after completion of high resolution CT  Pulmonary nodule Assessment: Pulmonary nodules scattered on lung cancer screening CT in January/2020  Plan: We will order high resolution CT to  further evaluate pulmonary nodules in July/2020 Continue to not smoke        Lauraine Rinne, NP 09/08/2018   This appointment was 34 min long with over 50% of the time in direct face-to-face patient care, assessment, plan of care, and follow-up.

## 2018-09-08 ENCOUNTER — Ambulatory Visit: Payer: PPO | Admitting: Pulmonary Disease

## 2018-09-08 ENCOUNTER — Ambulatory Visit (INDEPENDENT_AMBULATORY_CARE_PROVIDER_SITE_OTHER): Payer: PPO | Admitting: Pulmonary Disease

## 2018-09-08 ENCOUNTER — Encounter: Payer: Self-pay | Admitting: Pulmonary Disease

## 2018-09-08 VITALS — BP 136/70 | HR 80

## 2018-09-08 DIAGNOSIS — Z720 Tobacco use: Secondary | ICD-10-CM

## 2018-09-08 DIAGNOSIS — R911 Solitary pulmonary nodule: Secondary | ICD-10-CM

## 2018-09-08 DIAGNOSIS — J84112 Idiopathic pulmonary fibrosis: Secondary | ICD-10-CM

## 2018-09-08 DIAGNOSIS — G4733 Obstructive sleep apnea (adult) (pediatric): Secondary | ICD-10-CM | POA: Diagnosis not present

## 2018-09-08 LAB — PULMONARY FUNCTION TEST
DL/VA % pred: 78 %
DL/VA: 3.28 ml/min/mmHg/L
DLCO COR % PRED: 67 %
DLCO cor: 16.66 ml/min/mmHg
DLCO unc % pred: 67 %
DLCO unc: 16.8 ml/min/mmHg
FEF 25-75 PRE: 3.92 L/s
FEF 25-75 Post: 3.8 L/sec
FEF2575-%Change-Post: -3 %
FEF2575-%Pred-Post: 152 %
FEF2575-%Pred-Pre: 157 %
FEV1-%Change-Post: 0 %
FEV1-%Pred-Post: 90 %
FEV1-%Pred-Pre: 91 %
FEV1-Post: 2.84 L
FEV1-Pre: 2.86 L
FEV1FVC-%Change-Post: 0 %
FEV1FVC-%Pred-Pre: 114 %
FEV6-%Change-Post: 0 %
FEV6-%PRED-POST: 83 %
FEV6-%Pred-Pre: 84 %
FEV6-Post: 3.33 L
FEV6-Pre: 3.34 L
FEV6FVC-%Pred-Post: 105 %
FEV6FVC-%Pred-Pre: 105 %
FVC-%CHANGE-POST: 0 %
FVC-%Pred-Post: 79 %
FVC-%Pred-Pre: 79 %
FVC-Post: 3.33 L
FVC-Pre: 3.34 L
PRE FEV1/FVC RATIO: 86 %
Post FEV1/FVC ratio: 85 %
Post FEV6/FVC ratio: 100 %
Pre FEV6/FVC Ratio: 100 %
RV % pred: 85 %
RV: 1.88 L
TLC % pred: 82 %
TLC: 5.34 L

## 2018-09-08 NOTE — Progress Notes (Signed)
PFT done today. 

## 2018-09-08 NOTE — Assessment & Plan Note (Signed)
Assessment: Pulmonary nodules scattered on lung cancer screening CT in January/2020  Plan: We will order high resolution CT to further evaluate pulmonary nodules in July/2020 Continue to not smoke

## 2018-09-08 NOTE — Assessment & Plan Note (Signed)
Assessment: 70-pack-year smoking history Quit smoking December/2019  Plan: We will order high-resolution CT for further evaluation of fibrosis as well as pulmonary nodules Can consider resuming lung cancer screening CT, after completion of high resolution CT

## 2018-09-08 NOTE — Patient Instructions (Addendum)
We recommend that you continue using your CPAP daily >>>Keep up the hard work using your device >>> Goal should be wearing this for the entire night that you are sleeping, at least 4 to 6 hours  Remember:  . Do not drive or operate heavy machinery if tired or drowsy.  . Please notify the supply company and office if you are unable to use your device regularly due to missing supplies or machine being broken.  . Work on maintaining a healthy weight and following your recommended nutrition plan  . Maintain proper daily exercise and movement  . Maintaining proper use of your device can also help improve management of other chronic illnesses such as: Blood pressure, blood sugars, and weight management.   BiPAP/ CPAP Cleaning:  >>>Clean weekly, with Dawn soap, and bottle brush.  Set up to air dry.  We will order your high-resolution CT to be completed in July/2020  Walk today in office to evaluate oxygen levels      Follow-up with our office after completing high-resolution chest CT in July/2020 with Dr. Elsworth Soho     It is flu season:   >>> Best ways to protect herself from the flu: Receive the yearly flu vaccine, practice good hand hygiene washing with soap and also using hand sanitizer when available, eat a nutritious meals, get adequate rest, hydrate appropriately   Please contact the office if your symptoms worsen or you have concerns that you are not improving.   Thank you for choosing [facility] for your healthcare, and for allowing [place] to partner with you on your healthcare journey. I am thankful to be able to provide care to you today.   Wyn Quaker FNP-C

## 2018-09-08 NOTE — Assessment & Plan Note (Signed)
Assessment: Home sleep study in January/2020 shows mild obstructive sleep apnea, AHI 12 CPAP compliance report shows excellent compliance as well as an AHI of 0.9   Plan: Continue to use CPAP Patient continues to struggle with CPAP use and could consider referral for oral appliance If patient has any mask issues could consider referral to [facility] for tach and mask fitting

## 2018-09-08 NOTE — Assessment & Plan Note (Signed)
Assessment: January lung cancer screening CT shows UIP Pulmonary function test today shows no formal obstruction, DLCO 67 Walk test in office patient completed laps without oxygen desaturations January/2020 connective tissue disease work-up was negative  Plan: Continue forward with planned high-resolution CT in July/2020

## 2018-09-30 DIAGNOSIS — G4733 Obstructive sleep apnea (adult) (pediatric): Secondary | ICD-10-CM | POA: Diagnosis not present

## 2018-11-12 DIAGNOSIS — M7032 Other bursitis of elbow, left elbow: Secondary | ICD-10-CM | POA: Diagnosis not present

## 2018-11-12 DIAGNOSIS — M7022 Olecranon bursitis, left elbow: Secondary | ICD-10-CM | POA: Diagnosis not present

## 2018-11-12 DIAGNOSIS — G8918 Other acute postprocedural pain: Secondary | ICD-10-CM | POA: Diagnosis not present

## 2018-11-12 HISTORY — PX: ELBOW BURSA SURGERY: SHX615

## 2018-11-27 DIAGNOSIS — Z4789 Encounter for other orthopedic aftercare: Secondary | ICD-10-CM | POA: Diagnosis not present

## 2018-11-27 DIAGNOSIS — M7022 Olecranon bursitis, left elbow: Secondary | ICD-10-CM | POA: Diagnosis not present

## 2018-12-23 ENCOUNTER — Other Ambulatory Visit: Payer: Self-pay | Admitting: Adult Health

## 2018-12-23 DIAGNOSIS — E1329 Other specified diabetes mellitus with other diabetic kidney complication: Secondary | ICD-10-CM

## 2018-12-25 MED ORDER — ATORVASTATIN CALCIUM 40 MG PO TABS: ORAL_TABLET | ORAL | 0 refills | Status: DC

## 2019-01-01 ENCOUNTER — Telehealth: Payer: Self-pay | Admitting: *Deleted

## 2019-01-01 NOTE — Telephone Encounter (Signed)
Script Screening patients for COVID-19 and reviewing new operational procedures  Greeting - The reason I am calling is to share with you some new changes to our processes that are designed to help us keep everyone safe. Is now a good time to speak with you? Patient says "no' - ask them when you can call back and let them know it's important to do this prior to their appointment.  Patient says "yes" - Great, Tru the first thing I need to do is ask you some screening Questions.  1. To the best of your knowledge, have you been in close contact with any one with a confirmed diagnosis of COVID 19? o No - proceed to next question  2. Have you had any one or more of the following: fever, chills, cough, shortness of breath or any flu-like symptoms? o No - proceed to next question  3. Have you been diagnosed with or have a previous diagnosis of COVID 19? o No - proceed to next question  4. I am going to go over a few other symptoms with you. Please let me know if you are experiencing any of the following: . Ear, nose or throat discomfort . A sore throat . Headache . Muscle pain . Diarrhea . Loss of taste or smell o No - proceed to next question  Thank you for answering these questions. Please know we will ask you these questions or similar questions when you arrive for your appointment and again it's how we are keeping everyone safe. Also, to keep you safe, please use the provided hand sanitizer when you enter the building. Shrihan, we are asking everyone in the building to wear a mask because they help us prevent the spread of germs. Do you have a mask of your own, if not, we are happy to provide one for you. The last thing I want to go over with you is the no visitor guidelines. This means no one can attend the appointment with you unless you need physical assistance. I understand this may be different from your past appointments and I know this may be difficult but please know if  someone is driving you we are happy to call them for you once your appointment is over.  [INSERT SITE SPECIFIC CHECK IN PROCEDURES]  Westen I've given you a lot of information, what questions do you have about what I've talked about today or your appointment tomorrow? 

## 2019-01-05 ENCOUNTER — Ambulatory Visit (INDEPENDENT_AMBULATORY_CARE_PROVIDER_SITE_OTHER)
Admission: RE | Admit: 2019-01-05 | Discharge: 2019-01-05 | Disposition: A | Discharge status: Home / Self Care | Payer: PPO | Source: Ambulatory Visit | Attending: Pulmonary Disease | Admitting: Pulmonary Disease

## 2019-01-05 ENCOUNTER — Other Ambulatory Visit: Payer: Self-pay

## 2019-01-05 DIAGNOSIS — I700 Atherosclerosis of aorta: Secondary | ICD-10-CM | POA: Diagnosis not present

## 2019-01-05 DIAGNOSIS — J84112 Idiopathic pulmonary fibrosis: Secondary | ICD-10-CM

## 2019-01-05 DIAGNOSIS — J432 Centrilobular emphysema: Secondary | ICD-10-CM | POA: Diagnosis not present

## 2019-01-05 DIAGNOSIS — M47814 Spondylosis without myelopathy or radiculopathy, thoracic region: Secondary | ICD-10-CM | POA: Diagnosis not present

## 2019-01-05 DIAGNOSIS — R918 Other nonspecific abnormal finding of lung field: Secondary | ICD-10-CM | POA: Diagnosis not present

## 2019-01-07 NOTE — Progress Notes (Signed)
High-resolution CT chest results have come back still showing fibrosis.  This is stable.  Last office note shows the patient is a former smoker.  Please clarify this with the patient.  Patient CT results July/2020 showing current smoker.  If the patient is still smoking or has restarted smoking we need to emphasize the patient needs to stop.  We will monitor patient clinically if shortness of breath worsens or if breathing symptoms worsen please contact our office.  Follow-up with our office as originally scheduled. I have discussed these results with Dr. Elsworth Soho.   Wyn Quaker FNP

## 2019-01-08 ENCOUNTER — Telehealth: Payer: Self-pay | Admitting: Pulmonary Disease

## 2019-01-08 ENCOUNTER — Encounter: Payer: Self-pay | Admitting: Family Medicine

## 2019-01-08 NOTE — Telephone Encounter (Signed)
01/08/2019 2135  Per result note on high-resolution chest CT:  [name], CMA on 01/08/2019 at 10:52 AM EDT  Called patient to inform of results of CT showing fibrosis which is stable.  Pt states he had quit smoking for 6 mos but resumed smoking about 3 weeks ago.  Pt was encouraged to discontinue smoking immediately.  Pt has a recall in place to see Dr. Elsworth Soho next available.   Jess,  Can we please make sure the patient is scheduled within the next 6-8 weeks with either Dr. Elsworth Soho or myself to review CT results as well as to further assess in person smoking cessation.  Pt does not need Dr. Elsworth Soho next available. If the patient has any questions regarding smoking or how to stop smoking more than happy to complete a tele-visit or an office visit to talk with him about this.  Wyn Quaker FNP

## 2019-01-08 NOTE — Telephone Encounter (Signed)
-----   Message from Rigoberto Noel, MD sent at 01/07/2019  2:26 PM EDT ----- Regarding: RE: HRCT Results Mild intraparenchymal restriction on PFTsDid not need any other work-up, follow clinically and emphasized smoking cessation  RA  ----- Message ----- From: Lauraine Rinne, NP Sent: 01/07/2019   9:08 AM EDT To: Rigoberto Noel, MD Subject: HRCT Results                                   Dr. Elsworth Soho,  This patient's high-resolution CT chest results have come back.  See the results listed below:  01/05/2019-HRCT-Pulmonary parenchymal pattern fibrosis may be due to fibrotic nonspecific interstitial pneumonitis, findings are indeterminate for UIP per consensus guidelines.  Scattered small pulmonary nodules as seen on January/15/2020 lung cancer screening CT, emphysema  January/2020 connective tissue work-up so far has been initially negative.  See results below:  January/20 20/2020-ANA-negative January/22/2020-CCP-less than 16 07/23/2018-ACE level-less than 1 07/23/2018-sed rate-8  With the formal high-resolution CT chest read now favoring NSIP would you like for me to do a broader connective tissue work-up?  Or you okay watching patient clinically and repeating the CT of his chest in a year, which is planned anyway with a lung cancer screening program?  Let me know your thoughts, Aaron Edelman

## 2019-01-09 NOTE — Telephone Encounter (Signed)
There is currently no August schedule for the doctors, 6 weeks from today is 8.21.2020 Recall placed for [facility] for that time-frame Patient is aware to contact the office if he needs any extra support with quitting smoking or changes in breathing  Nothing further needed; will sign off

## 2019-01-13 ENCOUNTER — Other Ambulatory Visit: Payer: Self-pay

## 2019-01-13 ENCOUNTER — Other Ambulatory Visit (INDEPENDENT_AMBULATORY_CARE_PROVIDER_SITE_OTHER): Payer: PPO

## 2019-01-13 DIAGNOSIS — E1329 Other specified diabetes mellitus with other diabetic kidney complication: Secondary | ICD-10-CM

## 2019-01-13 LAB — HEMOGLOBIN A1C: Hgb A1c MFr Bld: 5.2 % (ref 4.6–6.5)

## 2019-01-14 ENCOUNTER — Encounter: Payer: Self-pay | Admitting: Adult Health

## 2019-01-14 ENCOUNTER — Ambulatory Visit (INDEPENDENT_AMBULATORY_CARE_PROVIDER_SITE_OTHER): Payer: PPO | Admitting: Adult Health

## 2019-01-14 DIAGNOSIS — E1129 Type 2 diabetes mellitus with other diabetic kidney complication: Secondary | ICD-10-CM

## 2019-01-14 NOTE — Progress Notes (Signed)
Virtual Visit via Video Note  I connected with Richard [name]  on 01/14/19 at  8:00 AM EDT by a video enabled telemedicine application and verified that I am speaking with the correct person using two identifiers.  Location patient: home Location provider:work or home office Persons participating in the virtual visit: patient, provider  I discussed the limitations of evaluation and management by telemedicine and the availability of in person appointments. The patient expressed understanding and agreed to proceed.   HPI: Richard [name] 68-year-old male who is being evaluated today for diabetes mellitus.  He is currently prescribed metformin 500 mg daily.  His last A1c 6 months ago was 5.8.  He does stay active and he tries to eat a heart healthy diet.  He denies any symptoms of hypoglycemia.  No longer checks his blood sugars at home.    Today's A1c is 5.2.   ROS: See pertinent positives and negatives per HPI.  Past Medical History:  Diagnosis Date  . ANEMIA-NOS 12/06/2008  . BACK PAIN 02/20/2010  . DIABETES MELLITUS, TYPE II 06/01/2008  . GERD 06/01/2008  . HYPERLIPIDEMIA 06/01/2008  . HYPERTENSION 06/01/2008  . Tremors of nervous system    pt states he has had tremors most of his life in his hands/hereditary    Past Surgical History:  Procedure Laterality Date  . COLONOSCOPY    . ELBOW BURSA SURGERY     right elbow  . ELBOW BURSA SURGERY Left 11/12/2018   Pt had left olecranon bursectomy  . HAND SURGERY     2 surgeries on right hand  . HAND SURGERY     left hand/removed a bone    Family History  Problem Relation Age of Onset  . Diabetes Sister   . Alzheimer's disease Sister   . Diabetes Brother   . Heart disease Mother   . Sudden death Father        Suicide after his wife passed away   . Colon cancer Neg Hx      Current Outpatient Medications:  .  ACCU-CHEK AVIVA test strip, USE ONCE DAILY AND AS NEEDED, Disp: 100 each, Rfl: 3 .  amLODipine (NORVASC) 5 MG tablet, TAKE 1  TABLET(5 MG) BY MOUTH DAILY, Disp: 90 tablet, Rfl: 3 .  atorvastatin (LIPITOR) 40 MG tablet, TAKE 1 TABLET(40 MG) BY MOUTH DAILY, Disp: 90 tablet, Rfl: 0 .  Lancets (ACCU-CHEK MULTICLIX) lancets, 1 each by Other route daily. Use as instructed , Disp: , Rfl:  .  lisinopril (PRINIVIL,ZESTRIL) 40 MG tablet, Take 1 tablet (40 mg total) by mouth daily., Disp: 90 tablet, Rfl: 3 .  metFORMIN (GLUCOPHAGE) 500 MG tablet, TAKE 1 TABLET(500 MG) BY MOUTH AT BEDTIME, Disp: 90 tablet, Rfl: 0 .  omeprazole (PRILOSEC) 20 MG capsule, Take 1 capsule (20 mg total) by mouth daily., Disp: 90 capsule, Rfl: 3 .  sildenafil (VIAGRA) 100 MG tablet, Take 1 tablet (100 mg total) by mouth daily as needed for erectile dysfunction., Disp: 10 tablet, Rfl: 6  Current Facility-Administered Medications:  .  0.9 %  sodium chloride infusion, 500 mL, Intravenous, Once, Irene Shipper, MD .  aspirin chewable tablet 81 mg, 81 mg, Oral, Once, Marletta Lor, MD  EXAM:  VITALS per patient if applicable:  GENERAL: alert, oriented, appears well and in no acute distress  HEENT: atraumatic, conjunttiva clear, no obvious abnormalities on inspection of external nose and ears  NECK: normal movements of the head and neck  LUNGS: on inspection no signs of respiratory  distress, breathing rate appears normal, no obvious gross SOB, gasping or wheezing  CV: no obvious cyanosis  MS: moves all visible extremities without noticeable abnormality  PSYCH/NEURO: pleasant and cooperative, no obvious depression or anxiety, speech and thought processing grossly intact  ASSESSMENT AND PLAN:  Discussed the following assessment and plan:  1. Type 2 diabetes mellitus with other diabetic kidney complication, without long-term current use of insulin (HCC) -A1c has improved.  Continue with metformin 500 mg nightly as he is tolerating this well.  We will recheck A1c at his physical in January.  Continue with lifestyle modifications   I  discussed the assessment and treatment plan with the patient. The patient was provided an opportunity to ask questions and all were answered. The patient agreed with the plan and demonstrated an understanding of the instructions.   The patient was advised to call back or seek an in-person evaluation if the symptoms worsen or if the condition fails to improve as anticipated.   [name], NP

## 2019-03-29 ENCOUNTER — Other Ambulatory Visit: Payer: Self-pay | Admitting: Adult Health

## 2019-03-29 DIAGNOSIS — E1329 Other specified diabetes mellitus with other diabetic kidney complication: Secondary | ICD-10-CM

## 2019-03-31 NOTE — Telephone Encounter (Signed)
Sent to the pharmacy by e-scribe for 90 days. 

## 2019-05-04 ENCOUNTER — Other Ambulatory Visit: Payer: Self-pay

## 2019-05-04 ENCOUNTER — Ambulatory Visit: Payer: PPO | Admitting: Pulmonary Disease

## 2019-05-04 ENCOUNTER — Encounter: Payer: Self-pay | Admitting: Pulmonary Disease

## 2019-05-04 DIAGNOSIS — R911 Solitary pulmonary nodule: Secondary | ICD-10-CM

## 2019-05-04 DIAGNOSIS — Z720 Tobacco use: Secondary | ICD-10-CM | POA: Diagnosis not present

## 2019-05-04 DIAGNOSIS — G4733 Obstructive sleep apnea (adult) (pediatric): Secondary | ICD-10-CM

## 2019-05-04 DIAGNOSIS — J849 Interstitial pulmonary disease, unspecified: Secondary | ICD-10-CM | POA: Diagnosis not present

## 2019-05-04 DIAGNOSIS — J84112 Idiopathic pulmonary fibrosis: Secondary | ICD-10-CM

## 2019-05-04 NOTE — Assessment & Plan Note (Signed)
HRCT was indeterminate for UIP and seem to favor NSIP Smoking cessation remains the most important intervention here.  He has no stigmata of collagen vascular disease  We will repeat HRCT in 08/2019 and consider PFTs to see if there is decline or not.  Meantime focus on smoking cessation

## 2019-05-04 NOTE — Patient Instructions (Signed)
You have changes of emphysema and mild fibrosis in your lungs.  You have to quit smoking!.  High-resolution CT chest in March 2021

## 2019-05-04 NOTE — Progress Notes (Signed)
   Subjective:    Patient ID: Richard [name], male    DOB: [birth year], 68 y.o.   MRN: [id number]  HPI   68 yo heavy smoker for FU of OSA, emphysema & ILD He is a diabetic hypertensive and has hyperlipidemia. He has familial tremors which are quite severe. He smoked about 2 packs/day until he quit 06/2018.    Has started again up to 1 pack/day   Chief Complaint  Patient presents with  . Follow-up    pt states he's doing well.  pt no longer on cpap, states he turned it in to [facility] mos ago.      He used CPAP for 12 bed but was unable to adjust a mask or hose.  His wife did not come back to the bedroom.  He finally quit using CPAP and returned the machine He sleeps mostly on his side and denies daytime somnolence and fatigue.  Breathing is stable, we reviewed PFTs and HRCT in detail  He is still smoking up to a pack per day, he has tried Chantix in the past  Significant tests/ events reviewed  HST 07/2018 >> wt 190 lbs, mild OSA, AHI 12/hour, lowest desaturation 78%, worse in the supine position  LDCT 07/2018 >> changes of emphysema, distribution of fibrosis peripheral and basilar suggestive of IPF, multiple nodules, largest 5 mm nodule anterior aspect of left lower lobe  HRCT 12/2018 indeterminate  09/08/2018-pulmonary function test-FVC 3.34 (79% predicted), postbronchodilator ratio 85, postbronchodilator FEV1 2.84 (90% predicted), no positive bronchodilator response, DLCO 67  ANA/ACE/CCP neg  Past Medical History:  Diagnosis Date  . ANEMIA-NOS 12/06/2008  . BACK PAIN 02/20/2010  . DIABETES MELLITUS, TYPE II 06/01/2008  . GERD 06/01/2008  . HYPERLIPIDEMIA 06/01/2008  . HYPERTENSION 06/01/2008  . Tremors of nervous system    pt states he has had tremors most of his life in his hands/hereditary     Review of Systems neg for any significant sore throat, dysphagia, itching, sneezing, nasal congestion or excess/ purulent secretions, fever, chills, sweats, unintended  wt loss, pleuritic or exertional cp, hempoptysis, orthopnea pnd or change in chronic leg swelling. Also denies presyncope, palpitations, heartburn, abdominal pain, nausea, vomiting, diarrhea or change in bowel or urinary habits, dysuria,hematuria, rash, arthralgias, visual complaints, headache, numbness weakness or ataxia.      Objective:   Physical Exam  Gen. Pleasant, well-nourished, in no distress, normal affect ENT - no pallor,icterus, no post nasal drip Neck: No JVD, no thyromegaly, no carotid bruits Lungs: no use of accessory muscles, no dullness to percussion, bibasal rales no rhonchi  Cardiovascular: Rhythm regular, heart sounds  normal, no murmurs or gallops, no peripheral edema Abdomen: soft and non-tender, no hepatosplenomegaly, BS normal. Musculoskeletal: No deformities, no cyanosis or clubbing Neuro:  alert, non focal       Assessment & Plan:

## 2019-05-04 NOTE — Assessment & Plan Note (Signed)
Smoking cessation again discussed he has used Chantix in the past Does not want to use nicotine supplementation

## 2019-05-04 NOTE — Assessment & Plan Note (Signed)
  Positional therapy and avoid sleeping in the supine position. He has quit using CPAP and advised against weight gain

## 2019-05-04 NOTE — Assessment & Plan Note (Signed)
Stable nodules. Follow-up CT in March 2021

## 2019-07-02 ENCOUNTER — Other Ambulatory Visit: Payer: Self-pay | Admitting: Adult Health

## 2019-07-02 DIAGNOSIS — E1329 Other specified diabetes mellitus with other diabetic kidney complication: Secondary | ICD-10-CM

## 2019-07-12 ENCOUNTER — Other Ambulatory Visit: Payer: Self-pay | Admitting: Adult Health

## 2019-07-12 DIAGNOSIS — K2100 Gastro-esophageal reflux disease with esophagitis, without bleeding: Secondary | ICD-10-CM

## 2019-07-30 ENCOUNTER — Other Ambulatory Visit: Payer: Self-pay | Admitting: Adult Health

## 2019-07-30 DIAGNOSIS — I10 Essential (primary) hypertension: Secondary | ICD-10-CM

## 2019-08-20 ENCOUNTER — Inpatient Hospital Stay: Admission: RE | Admit: 2019-08-20 | Payer: PPO | Source: Ambulatory Visit

## 2019-08-31 ENCOUNTER — Telehealth: Payer: Self-pay | Admitting: Adult Health

## 2019-08-31 NOTE — Chronic Care Management (AMB) (Signed)
  Chronic Care Management   Note  08/31/2019 Name: Richard [name] MRN: [id number] DOB: [birth year]  Richard [name] is a 69 y.o. year old male who is a primary care patient of [name], NP. I reached out to Richard [name] by phone today in response to a referral sent by Mr. Richard [name]'s PCP, [name], NP. Wife, Richard [name] gave verbal consent for CCM.  Richard [name] was given information about Chronic Care Management services today including:  1. CCM service includes personalized support from designated clinical staff supervised by his physician, including individualized plan of care and coordination with other care providers 2. 24/7 contact phone numbers for assistance for urgent and routine care needs. 3. Service will only be billed when office clinical staff spend 20 minutes or more in a month to coordinate care. 4. Only one practitioner may furnish and bill the service in a calendar month. 5. The patient may stop CCM services at any time (effective at the end of the month) by phone call to the office staff. 6. The patient will be responsible for cost sharing (co-pay) of up to 20% of the service fee (after annual deductible is met).  Patient agreed to services and verbal consent obtained.   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

## 2019-09-03 ENCOUNTER — Ambulatory Visit: Payer: PPO | Attending: Internal Medicine

## 2019-09-03 ENCOUNTER — Ambulatory Visit (INDEPENDENT_AMBULATORY_CARE_PROVIDER_SITE_OTHER)
Admission: RE | Admit: 2019-09-03 | Discharge: 2019-09-03 | Disposition: A | Discharge status: Home / Self Care | Payer: PPO | Source: Ambulatory Visit | Attending: Pulmonary Disease | Admitting: Pulmonary Disease

## 2019-09-03 ENCOUNTER — Other Ambulatory Visit: Payer: Self-pay

## 2019-09-03 DIAGNOSIS — J849 Interstitial pulmonary disease, unspecified: Secondary | ICD-10-CM

## 2019-09-03 DIAGNOSIS — J8410 Pulmonary fibrosis, unspecified: Secondary | ICD-10-CM | POA: Diagnosis not present

## 2019-09-03 DIAGNOSIS — Z23 Encounter for immunization: Secondary | ICD-10-CM

## 2019-09-03 NOTE — Progress Notes (Signed)
   Covid-19 Vaccination Clinic  Name:  Richard [name]    MRN: [id number] DOB: 1951-06-06  09/03/2019  [name] was observed post Covid-19 immunization for 15 minutes without incident. He was provided with Vaccine Information Sheet and instruction to access the V-Safe system.   [name] was instructed to call 911 with any severe reactions post vaccine: Marland Kitchen Difficulty breathing  . Swelling of face and throat  . A fast heartbeat  . A bad rash all over body  . Dizziness and weakness   Immunizations Administered    Name Date Dose VIS Date Route   Pfizer COVID-19 Vaccine 09/03/2019 12:23 PM 0.3 mL 06/12/2019 Intramuscular   Manufacturer: [employer]   Lot: [id number]   [place]: [id number]

## 2019-09-08 ENCOUNTER — Telehealth: Payer: Self-pay

## 2019-09-08 DIAGNOSIS — E1129 Type 2 diabetes mellitus with other diabetic kidney complication: Secondary | ICD-10-CM

## 2019-09-08 DIAGNOSIS — N182 Chronic kidney disease, stage 2 (mild): Secondary | ICD-10-CM

## 2019-09-08 NOTE — Telephone Encounter (Signed)
I am requesting an ambulatory referral to CCM be placed for this patient. Referral will need to have 2 current diagnosis attached to it. Thank you!  

## 2019-09-09 ENCOUNTER — Telehealth: Payer: Self-pay | Admitting: Acute Care

## 2019-09-09 ENCOUNTER — Other Ambulatory Visit: Payer: Self-pay | Admitting: General Surgery

## 2019-09-09 ENCOUNTER — Ambulatory Visit: Payer: PPO

## 2019-09-09 ENCOUNTER — Other Ambulatory Visit: Payer: Self-pay

## 2019-09-09 DIAGNOSIS — J84112 Idiopathic pulmonary fibrosis: Secondary | ICD-10-CM

## 2019-09-09 DIAGNOSIS — I10 Essential (primary) hypertension: Secondary | ICD-10-CM

## 2019-09-09 DIAGNOSIS — E1129 Type 2 diabetes mellitus with other diabetic kidney complication: Secondary | ICD-10-CM

## 2019-09-09 NOTE — Chronic Care Management (AMB) (Signed)
Chronic Care Management Pharmacy  Name: Richard [name]  MRN: [id number] DOB: 05-03-1951  Initial Questions: 1. Have you seen any other providers since your last visit? NA 2. Any changes in your medicines or health? No   Chief Complaint/ HPI  Richard [name],  69 y.o. , male presents for their Initial CCM visit with the clinical pharmacist via telephone due to COVID-19 Pandemic.  PCP : [name], NP  Their chronic conditions include: HTN, IPF, GERD, DM, CKD, stage 2, HLD, Tobacco use  Office Visits: 01/14/2019- Patient presented to [name], NP for DM follow up. A1c improved from 5.8 to 5.2. Patient to continue metformin 500mg , 1 tablet daily and lifestyle modifications.   Consult Visit: 05/04/2019- Pulmonology- Patient presented to Dr. Kara Mead, MD for IPF. Patient is a heavy smoker and has OSA, emphysema and ILD. Discussed smoking cessation. Patient has used Chantix in the past; does not want to use nicotine replacement. Patient stopped using CPAP, patient was advised weight gain and to avoid sleeping in supine position.   Medications: Outpatient Encounter Medications as of 09/09/2019  Medication Sig  . amLODipine (NORVASC) 5 MG tablet TAKE 1 TABLET(5 MG) BY MOUTH DAILY  . Ascorbic Acid (VITAMIN C) 1000 MG tablet Take 1,000 mg by mouth daily.  Marland Kitchen aspirin EC 81 MG tablet Take 81 mg by mouth daily.  Marland Kitchen atorvastatin (LIPITOR) 40 MG tablet TAKE 1 TABLET(40 MG) BY MOUTH DAILY  . cholecalciferol (VITAMIN D3) 25 MCG (1000 UNIT) tablet Take 1,000 Units by mouth daily.  Marland Kitchen ibuprofen (ADVIL) 200 MG tablet Take 200 mg by mouth 3 (three) times daily.  Marland Kitchen lisinopril (ZESTRIL) 40 MG tablet TAKE 1 TABLET(40 MG) BY MOUTH DAILY  . metFORMIN (GLUCOPHAGE) 500 MG tablet TAKE 1 TABLET(500 MG) BY MOUTH AT BEDTIME  . omeprazole (PRILOSEC) 20 MG capsule TAKE 1 CAPSULE(20 MG) BY MOUTH DAILY  . sildenafil (VIAGRA) 100 MG tablet Take 1 tablet (100 mg total) by mouth daily as needed for erectile  dysfunction. (Patient not taking: Reported on 09/09/2019)   No facility-administered encounter medications on file as of 09/09/2019.   Current Diagnosis/Assessment:  Goals Addressed            This Visit's Progress   . Pharmacy Care Plan       CARE PLAN ENTRY  Current Barriers:  . Chronic Disease Management support, education, and care coordination needs related to HTN, HLD, DMII, CKD, and tobacco use, IPF  Pharmacist Clinical Goal(s):  . Call provider office for new or worsened signs and symptoms  . Over the next month, begin/ continue self health monitoring activities as directed today  . Call care management team with questions or concerns . Diabetes:  Marland Kitchen Maintain A1c < 7.0% . Maintain update to date on diabetes preventative health.  . Blood pressure . Maintain blood pressure within goal of your provider (130/80 or 140/90)  . Maintain low salt diet . High cholesterol:  . Cholesterol goals: Total Cholesterol goal under 200, Triglycerides goal under 150, HDL goal above 40 (men) or above 50 (women), LDL goal under 70.  . Tobacco use:  Marland Kitchen Decrease tobacco use over the next couple of months.  . Heart burn: Minimize reflux symptoms and use of PPI's if not indicated. . Leg and back pain  . Continue to see improvement in pain level.   Interventions: . Comprehensive medication review performed. . Diabetes . Discussed importance of diabetes preventative health exams. . Obtain diabetic eye exam every 1 to  2 years.  . Obtain at least once yearly foot exams. . Continue: metformin 500mg , 1 tablet at bedtime  . Blood pressure:   . Discussed diet modifications. DASH diet:  following a diet emphasizing fruits and vegetables and low-fat dairy products along with whole grains, fish, poultry, and nuts. Reducing red meats and sugars.  . Smoking cessation   . Limiting the amount of alcohol to 1 or 2 drinks per day.   . Reducing the amount of salt intake to 1500mg /per day.  . Recommend using  a salt substitute to replace your salt if you need flavor.    . Getting enough potassium in your diet equaling 3500-5000mg /day.  This helps to regulate BP by balancing out the effects of salt.   . Weight reduction- We discussed losing 5-10% of body weight . Continue: amlodipine 5mg , 1 tablet daily and lisinopril 40mg , 1 tablet daily . High cholesterol . How to reduce cholesterol through diet/weight management and physical activity.    . We discussed how a diet high in plant sterols (fruits/vegetables/nuts/whole grains/legumes) may reduce your cholesterol.  Encouraged increasing fiber to a daily intake of 10-25g/day  . Continue: atorvastatin 40mg , 1 tablet daily . Tobacco use . Aim on identifying quit date and obtain nicotine patches in preparation of quit date. Marland Kitchen GERD: . Discussed non-pharmacological interventions for acid reflux. Take measures to prevent acid reflux, such as avoiding spicy foods, avoiding caffeine, avoid laying down a few hours after eating, and raising the head of the bed. . Decrease omeprazole 20mg , 1 capsule every other day for 2 weeks and attempt to stop. Continue use only as needed if no symptoms return. . Chronic kidney disease .  Discussed: maintaining proper hydration. Avoiding NSAID's . Leg/ back pain . Consider trial of Tylenol Extra strength.  . Lifestyle modifications . Exercise:  We discussed modifying their lifestyle, including initial attempts to lose 5 to 10% of body weight.   Engaging in at least 150 minutes per week of moderate-intensity exercise such as brisk walking (15- to 20-minute mile) or something similar. Recommended they incorporate flexibility, balance, and some type of strength training exercises.  Please begin slowly and build up gradually.  I discouraged prolonged sitting times.    . Diet:  We discussed following diets like the Mediterranean, or DASH diet.  Emphasizing seafood, lean meats, whole grains, legumes, nuts, fruits (for dessert), and  vegetables. Limiting sugars, carbs, and red meat, refined and processed foods  Patient Self Care Activities:  . Self administers medications as prescribed and Calls provider office for new concerns or questions . Continue current medications as directed by providers.  . Continue following up with specialists. . Continue working on health habits (diet/ exercise).  Initial goal documentation       Diabetes/ prediabetes    Recent Relevant Labs: Lab Results  Component Value Date/Time   HGBA1C 5.2 01/13/2019 08:38 AM   HGBA1C 5.8 07/16/2018 07:59 AM   HGBA1C 5.3 05/16/2018 07:53 AM   MICROALBUR 5.3 (H) 05/30/2017 09:10 AM   MICROALBUR 5.7 (H) 04/27/2016 09:00 AM    Patient has failed these meds in past: none Patient is currently controlled on the following medications:  - metformin 500mg , 1 tablet at bedtime   Last diabetic Foot exam:  Lab Results  Component Value Date/Time   HMDIABEYEEXA no diab. retinopathy 04/23/2011 12:00 AM   - patient notes obtaining eye exams every year  Last diabetic Eye exam:  Lab Results  Component Value Date/Time   HMDIABFOOTEX  yes 12/12/2009 12:00 AM    We discussed: diet and exercise extensively  Preventative diabetic health exams  Maintain a low carbohydrate diet, eating 45 grams of carbohydrates per meal (3 servings of carbohydrates per meal), 15 grams of carbohydrates per snack (1 serving of carbohydrate per snack).   - Patient notes changing diet completely; now focusing on low carbs/ low fat diet.   Plan Continue current medications and control with diet and exercise.    Hypertension   BP today is:  <140/90  Office blood pressures are  BP Readings from Last 3 Encounters:  05/04/19 136/70  09/08/18 136/70  07/29/18 130/80   Patient has failed these meds in the past: none   Patient checks BP at home never  Patient home BP readings are ranging: does not check at home.   Patient is controlled on:  - amlodipine 5mg , 1 tablet  daily - lisinopril 40mg , 1 tablet daily  We discussed diet and exercise extensively.   DASH diet:  following a diet emphasizing fruits and vegetables and low-fat dairy products along with whole grains, fish, poultry, and nuts. Reducing red meats and sugars.   Smoking cessation   Exercising- see exercising section below   Limiting the amount of alcohol to 1 or 2 drinks per day.    Reducing the amount of salt intake to 1500mg /per day.   Recommend using a salt substitute to replace your salt if you need flavor.     Getting enough potassium in your diet equaling 3500-5000mg /day.  This helps to regulate BP by balancing out the effects of salt.    Weight reduction- We discussed losing 5-10% of body weight.    Plan Denies dizziness/lightheadedness/HA, etc.  See below for smoking cessation.  Patient modifying diet (including more vegetables and minimizing salt intake).  Continue current medications and control with diet and exercise.    Hyperlipidemia   Lipid Panel     Component Value Date/Time   CHOL 140 07/16/2018 0759   TRIG 76.0 07/16/2018 0759   HDL 54.60 07/16/2018 0759   CHOLHDL 3 07/16/2018 0759   VLDL 15.2 07/16/2018 0759   LDLCALC 70 07/16/2018 0759    The 10-year ASCVD risk score Mikey Bussing DC Jr., et al., 2013) is: 31.6%.   Patient has failed these meds in past: simvastatin Patient is currently controlled on the following medications:  - atorvastatin 40mg , 1 tablet daily    We discussed:  diet and exercise extensively  How to reduce cholesterol through diet/weight management and physical activity.     We discussed how a diet high in plant sterols (fruits/vegetables/nuts/whole grains/legumes) may reduce your cholesterol.  Encouraged increasing fiber to a daily intake of 10-25g/day   Plan Continue current medications.    IPF/ emphysema /Tobacco  Last spirometry score:  09/08/2018 FVC: 3.34 (79% predicted) FEV1/FVC: 86 FEV1 2.84 (90% predicted)  Eosinophil  count:   Lab Results  Component Value Date/Time   EOSPCT 4.7 07/16/2018 07:59 AM  %                               Eos (Absolute):  Lab Results  Component Value Date/Time   EOSABS 0.3 07/16/2018 07:59 AM   Tobacco Status:   Social History   Tobacco Use  Smoking Status Current Every Day Smoker  . Packs/day: 2.00  . Years: 35.00  . Pack years: 70.00  . Types: Cigarettes  . Last attempt to quit: 06/08/2018  .  Years since quitting: 1.2  Smokeless Tobacco Never Used  Tobacco Comment   down to 1.5 ppd X8 mos.  plans to quit on upcoming birthday   Patient has failed these meds in past: bupropion (Wellbutrin SR), Chantix  Patient is currently managed on the following medications: no inhalers; patient instructed to quit smoking in order to improve breathing.   - patient states he has quit multiple times in the past, but eventually goes back to smoking. Longest he has ever quit was for 15 years.  - patient recognizes benefits of smoking cessation and is willing to try again. - He is not interested in trying bupropion or Chantix; "felt like it did nothing".  - Patient states he has a goal of quit date in 2 weeks. He will be out of town next week, and hopes to focus on this when he comes back.  - Patient stated he had success in the past with nicotine patches and he will attempt again.   We discussed:  smoking cessation Patient set to receive 2nd dose of Covid vaccine at the end of this month.  Plan Managed by pulmonology (Dr. Kara Mead). Patient to continue with plans of smoking cessation.  Will follow up in a month for reassessment.     GERD/ acid reflux  Patient has failed these meds in past: none  Patient is currently controlled on the following medications:  - omeprazole 20mg , 1 capsule daily   We discussed:  non-pharmacological interventions for acid reflux. Take measures to prevent acid reflux, such as avoiding spicy foods, avoiding caffeine, avoid laying down a few hours  after eating, and raising the head of the bed.  - patient states no GERD sx, especially since he has changed diet. Discussed taper plan of PPI.   Plan Patient to decrease omeprazole 20mg  every other day for 2 weeks and attempt to stop.  Will follow up with patient in 1 month for reassessment.   CKD, Stage II   Lab Results  Component Value Date   CREATININE 1.35 07/16/2018   CREATININE 1.53 (H) 10/30/2017   CREATININE 1.64 (H) 05/30/2017   GFR: 56 (07/16/2018) Estimated CrCl: 58 (07/16/2018)   We discussed: maintaining proper hydration. Avoiding NSAID's.   Plan Kidney function stable. Continue to monitor and adjust medications as needed.  Patient to have physical with Tommi Rumps on 3/25.   Leg/ back pain  Patient has failed these meds in past: Tylenol (many years ago; unsure of pain control).  Patient is currently controlled on the following medications:  - Ibuprofen 200mg , 1 tablet three times daily   We discussed:  caution of ibuprofen and kidney function. Suggested patient try Tylenol.  - Patient notes pain after being active.   Plan Continue current medications   Medication management   Patient organizes medications: patient has routine. He separates administration times as morning and night. All medications are taking in the AM except metformin and atorvastatin at night Barriers: Denies issues obtaining medications.   Follow up Follow up in PharmD visit in 1 year.  Will conduct general telephone calls for periodic check-ins for PPI deprescribing and smoking cessation before next visit.  Patient has physical appointment with [name] on 3/25. Patient to obtain labwork then.   Anson Crofts, PharmD Clinical Pharmacist [facility] [place] [phone]

## 2019-09-10 NOTE — Telephone Encounter (Signed)
See result note 09/09/19.

## 2019-09-10 NOTE — Patient Instructions (Addendum)
Visit Information  Goals Addressed            This Visit's Progress   . Pharmacy Care Plan       CARE PLAN ENTRY  Current Barriers:  . Chronic Disease Management support, education, and care coordination needs related to HTN, HLD, DMII, CKD, and tobacco use, IPF  Pharmacist Clinical Goal(s):  . Call provider office for new or worsened signs and symptoms  . Over the next month, begin/ continue self health monitoring activities as directed today  . Call care management team with questions or concerns . Diabetes:  Marland Kitchen Maintain A1c < 7.0% . Maintain update to date on diabetes preventative health.  . Blood pressure . Maintain blood pressure within goal of your provider (130/80 or 140/90)  . Maintain low salt diet . High cholesterol:  . Cholesterol goals: Total Cholesterol goal under 200, Triglycerides goal under 150, HDL goal above 40 (men) or above 50 (women), LDL goal under 70.  . Tobacco use:  Marland Kitchen Decrease tobacco use over the next couple of months.  . Heart burn: Minimize reflux symptoms and use of PPI's if not indicated. . Leg and back pain  . Continue to see improvement in pain level.   Interventions: . Comprehensive medication review performed. . Diabetes . Discussed importance of diabetes preventative health exams. . Obtain diabetic eye exam every 1 to 2 years.  . Obtain at least once yearly foot exams. . Continue: metformin 500mg , 1 tablet at bedtime  . Blood pressure:   . Discussed diet modifications. DASH diet:  following a diet emphasizing fruits and vegetables and low-fat dairy products along with whole grains, fish, poultry, and nuts. Reducing red meats and sugars.  . Smoking cessation   . Limiting the amount of alcohol to 1 or 2 drinks per day.   . Reducing the amount of salt intake to 1500mg /per day.  . Recommend using a salt substitute to replace your salt if you need flavor.    . Getting enough potassium in your diet equaling 3500-5000mg /day.  This helps to  regulate BP by balancing out the effects of salt.   . Weight reduction- We discussed losing 5-10% of body weight . Continue: amlodipine 5mg , 1 tablet daily and lisinopril 40mg , 1 tablet daily . High cholesterol . How to reduce cholesterol through diet/weight management and physical activity.    . We discussed how a diet high in plant sterols (fruits/vegetables/nuts/whole grains/legumes) may reduce your cholesterol.  Encouraged increasing fiber to a daily intake of 10-25g/day  . Continue: atorvastatin 40mg , 1 tablet daily . Tobacco use . Aim on identifying quit date and obtain nicotine patches in preparation of quit date. Marland Kitchen GERD: . Discussed non-pharmacological interventions for acid reflux. Take measures to prevent acid reflux, such as avoiding spicy foods, avoiding caffeine, avoid laying down a few hours after eating, and raising the head of the bed. . Decrease omeprazole 20mg , 1 capsule every other day for 2 weeks and attempt to stop. Continue use only as needed if no symptoms return. . Chronic kidney disease .  Discussed: maintaining proper hydration. Avoiding NSAID's . Leg/ back pain . Consider trial of Tylenol Extra strength.  . Lifestyle modifications . Exercise:  We discussed modifying their lifestyle, including initial attempts to lose 5 to 10% of body weight.   Engaging in at least 150 minutes per week of moderate-intensity exercise such as brisk walking (15- to 20-minute mile) or something similar. Recommended they incorporate flexibility, balance, and some type of strength  training exercises.  Please begin slowly and build up gradually.  I discouraged prolonged sitting times.    . Diet:  We discussed following diets like the Mediterranean, or DASH diet.  Emphasizing seafood, lean meats, whole grains, legumes, nuts, fruits (for dessert), and vegetables. Limiting sugars, carbs, and red meat, refined and processed foods  Patient Self Care Activities:  . Self administers medications as  prescribed and Calls provider office for new concerns or questions . Continue current medications as directed by providers.  . Continue following up with specialists. . Continue working on health habits (diet/ exercise).  Initial goal documentation        Richard [name] was given information about Chronic Care Management services today including:  1. CCM service includes personalized support from designated clinical staff supervised by his physician, including individualized plan of care and coordination with other care providers 2. 24/7 contact phone numbers for assistance for urgent and routine care needs. 3. Standard insurance, coinsurance, copays and deductibles apply for chronic care management only during months in which we provide at least 20 minutes of these services. Most insurances cover these services at 100%, however patients may be responsible for any copay, coinsurance and/or deductible if applicable. This service may help you avoid the need for more expensive face-to-face services. 4. Only one practitioner may furnish and bill the service in a calendar month. 5. The patient may stop CCM services at any time (effective at the end of the month) by phone call to the office staff.  Patient agreed to services and verbal consent obtained.   The patient verbalized understanding of instructions provided today and agreed to receive a mailed copy of patient instruction and/or educational materials. Telephone follow up appointment with pharmacy team member scheduled for: 09/08/2020  Anson Crofts, PharmD Clinical Pharmacist [facility] [place] [phone]    [place] stands for "Dietary Approaches to Stop Hypertension." The DASH eating plan is a healthy eating plan that has been shown to reduce high blood pressure (hypertension). It may also reduce your risk for type 2 diabetes, heart disease, and stroke. The DASH eating plan may also help with  weight loss. What are tips for following this plan?  General guidelines  Avoid eating more than 2,300 mg (milligrams) of salt (sodium) a day. If you have hypertension, you may need to reduce your sodium intake to 1,500 mg a day.  Limit alcohol intake to no more than 1 drink a day for nonpregnant women and 2 drinks a day for men. One drink equals 12 oz of beer, 5 oz of wine, or 1 oz of hard liquor.  Work with your health care provider to maintain a healthy body weight or to lose weight. Ask what an ideal weight is for you.  Get at least 30 minutes of exercise that causes your heart to beat faster (aerobic exercise) most days of the week. Activities may include walking, swimming, or biking.  Work with your health care provider or diet and nutrition specialist (dietitian) to adjust your eating plan to your individual calorie needs. Reading food labels   Check food labels for the amount of sodium per serving. Choose foods with less than 5 percent of the Daily Value of sodium. Generally, foods with less than 300 mg of sodium per serving fit into this eating plan.  To find whole grains, look for the word "whole" as the first word in the ingredient list. Shopping  Buy products labeled as "low-sodium"  or "no salt added."  Buy fresh foods. Avoid canned foods and premade or frozen meals. Cooking  Avoid adding salt when cooking. Use salt-free seasonings or herbs instead of table salt or sea salt. Check with your health care provider or pharmacist before using salt substitutes.  Do not fry foods. Cook foods using healthy methods such as baking, boiling, grilling, and broiling instead.  Cook with heart-healthy oils, such as olive, canola, soybean, or sunflower oil. Meal planning  Eat a balanced diet that includes: ? 5 or more servings of fruits and vegetables each day. At each meal, try to fill half of your plate with fruits and vegetables. ? Up to 6-8 servings of whole grains each  day. ? Less than 6 oz of lean meat, poultry, or fish each day. A 3-oz serving of meat is about the same size as a deck of cards. One egg equals 1 oz. ? 2 servings of low-fat dairy each day. ? A serving of nuts, seeds, or beans 5 times each week. ? Heart-healthy fats. Healthy fats called Omega-3 fatty acids are found in foods such as flaxseeds and coldwater fish, like sardines, salmon, and mackerel.  Limit how much you eat of the following: ? Canned or prepackaged foods. ? Food that is high in trans fat, such as fried foods. ? Food that is high in saturated fat, such as fatty meat. ? Sweets, desserts, sugary drinks, and other foods with added sugar. ? Full-fat dairy products.  Do not salt foods before eating.  Try to eat at least 2 vegetarian meals each week.  Eat more home-cooked food and less restaurant, buffet, and fast food.  When eating at a restaurant, ask that your food be prepared with less salt or no salt, if possible. What foods are recommended? The items listed may not be a complete list. Talk with your dietitian about what dietary choices are best for you. Grains Whole-grain or whole-wheat bread. Whole-grain or whole-wheat pasta. Brown rice. [place]. Bulgur. Whole-grain and low-sodium cereals. Pita bread. Low-fat, low-sodium crackers. Whole-wheat flour tortillas. Vegetables Fresh or frozen vegetables (raw, steamed, roasted, or grilled). Low-sodium or reduced-sodium tomato and vegetable juice. Low-sodium or reduced-sodium tomato sauce and tomato paste. Low-sodium or reduced-sodium canned vegetables. Fruits All fresh, dried, or frozen fruit. Canned fruit in natural juice (without added sugar). Meat and other protein foods Skinless chicken or [place]. Ground chicken or [place]. Pork with fat trimmed off. Fish and seafood. Egg whites. Dried beans, peas, or lentils. Unsalted nuts, nut butters, and seeds. Unsalted canned beans. Lean cuts of beef with fat trimmed off.  Low-sodium, lean deli meat. Dairy Low-fat (1%) or fat-free (skim) milk. Fat-free, low-fat, or reduced-fat cheeses. Nonfat, low-sodium ricotta or cottage cheese. Low-fat or nonfat yogurt. Low-fat, low-sodium cheese. Fats and oils Soft margarine without trans fats. Vegetable oil. Low-fat, reduced-fat, or light mayonnaise and salad dressings (reduced-sodium). Canola, safflower, olive, soybean, and sunflower oils. Avocado. Seasoning and other foods Herbs. Spices. Seasoning mixes without salt. Unsalted popcorn and pretzels. Fat-free sweets. What foods are not recommended? The items listed may not be a complete list. Talk with your dietitian about what dietary choices are best for you. Grains Baked goods made with fat, such as croissants, muffins, or some breads. Dry pasta or rice meal packs. Vegetables Creamed or fried vegetables. Vegetables in a cheese sauce. Regular canned vegetables (not low-sodium or reduced-sodium). Regular canned tomato sauce and paste (not low-sodium or reduced-sodium). Regular tomato and vegetable juice (not low-sodium or reduced-sodium). Angie Fava. Olives.  Fruits Canned fruit in a light or heavy syrup. Fried fruit. Fruit in cream or butter sauce. Meat and other protein foods Fatty cuts of meat. Ribs. Fried meat. Berniece Salines. Sausage. Bologna and other processed lunch meats. Salami. Fatback. Hotdogs. Bratwurst. Salted nuts and seeds. Canned beans with added salt. Canned or smoked fish. Whole eggs or egg yolks. Chicken or [place] with skin. Dairy Whole or 2% milk, cream, and half-and-half. Whole or full-fat cream cheese. Whole-fat or sweetened yogurt. Full-fat cheese. Nondairy creamers. Whipped toppings. Processed cheese and cheese spreads. Fats and oils Butter. Stick margarine. Lard. Shortening. Ghee. Bacon fat. Tropical oils, such as coconut, palm kernel, or palm oil. Seasoning and other foods Salted popcorn and pretzels. Onion salt, garlic salt, seasoned salt, table salt, and sea  salt. Worcestershire sauce. Tartar sauce. Barbecue sauce. Teriyaki sauce. Soy sauce, including reduced-sodium. Steak sauce. Canned and packaged gravies. Fish sauce. Oyster sauce. Cocktail sauce. Horseradish that you find on the shelf. Ketchup. Mustard. Meat flavorings and tenderizers. Bouillon cubes. Hot sauce and Tabasco sauce. Premade or packaged marinades. Premade or packaged taco seasonings. Relishes. Regular salad dressings. Where to find more information:  National Heart, Lung, and [place]: https://wilson-eaton.com/  American Heart Association: www.heart.org Summary  The DASH eating plan is a healthy eating plan that has been shown to reduce high blood pressure (hypertension). It may also reduce your risk for type 2 diabetes, heart disease, and stroke.  With the DASH eating plan, you should limit salt (sodium) intake to 2,300 mg a day. If you have hypertension, you may need to reduce your sodium intake to 1,500 mg a day.  When on the DASH eating plan, aim to eat more fresh fruits and vegetables, whole grains, lean proteins, low-fat dairy, and heart-healthy fats.  Work with your health care provider or diet and nutrition specialist (dietitian) to adjust your eating plan to your individual calorie needs. This information is not intended to replace advice given to you by your health care provider. Make sure you discuss any questions you have with your health care provider. Document Revised: 05/31/2017 Document Reviewed: 06/11/2016 Elsevier Patient Education  2020 [employer].  Steps to Quit Smoking Smoking tobacco is the leading cause of preventable death. It can affect almost every organ in the body. Smoking puts you and those around you at risk for developing many serious chronic diseases. Quitting smoking can be difficult, but it is one of the best things that you can do for your health. It is never too late to quit. How do I get ready to quit? When you decide to quit smoking, create a  plan to help you succeed. Before you quit:  Pick a date to quit. Set a date within the next 2 weeks to give you time to prepare.  Write down the reasons why you are quitting. Keep this list in places where you will see it often.  Tell your family, friends, and co-workers that you are quitting. Support from your loved ones can make quitting easier.  Talk with your health care provider about your options for quitting smoking.  Find out what treatment options are covered by your health insurance.  Identify people, places, things, and activities that make you want to smoke (triggers). Avoid them. What first steps can I take to quit smoking?  Throw away all cigarettes at home, at work, and in your car.  Throw away smoking accessories, such as Scientist, research (medical).  Clean your car. Make sure to empty the ashtray.  Clean  your home, including curtains and carpets. What strategies can I use to quit smoking? Talk with your health care provider about combining strategies, such as taking medicines while you are also receiving in-person counseling. Using these two strategies together makes you more likely to succeed in quitting than if you used either strategy on its own.  If you are pregnant or breastfeeding, talk with your health care provider about finding counseling or other support strategies to quit smoking. Do not take medicine to help you quit smoking unless your health care provider tells you to do so. To quit smoking: Quit right away  Quit smoking completely, instead of gradually reducing how much you smoke over a period of time. Research shows that stopping smoking right away is more successful than gradually quitting.  Attend in-person counseling to help you build problem-solving skills. You are more likely to succeed in quitting if you attend counseling sessions regularly. Even short sessions of 10 minutes can be effective. Take medicine You may take medicines to help you quit  smoking. Some medicines require a prescription and some you can purchase over-the-counter. Medicines may have nicotine in them to replace the nicotine in cigarettes. Medicines may:  Help to stop cravings.  Help to relieve withdrawal symptoms. Your health care provider may recommend:  Nicotine patches, gum, or lozenges.  Nicotine inhalers or sprays.  Non-nicotine medicine that is taken by mouth. Find resources Find resources and support systems that can help you to quit smoking and remain smoke-free after you quit. These resources are most helpful when you use them often. They include:  Online chats with a Social worker.  Telephone quitlines.  Printed Furniture conservator/restorer.  Support groups or group counseling.  Text messaging programs.  Mobile phone apps or applications. Use apps that can help you stick to your quit plan by providing reminders, tips, and encouragement. There are many free apps for mobile devices as well as websites. Examples include Quit Guide from the [employer] and smokefree.gov What things can I do to make it easier to quit?   Reach out to your family and friends for support and encouragement. Call telephone quitlines (1-800-QUIT-NOW), reach out to support groups, or work with a counselor for support.  Ask people who smoke to avoid smoking around you.  Avoid places that trigger you to smoke, such as bars, parties, or smoke-break areas at work.  Spend time with people who do not smoke.  Lessen the stress in your life. Stress can be a smoking trigger for some people. To lessen stress, try: ? Exercising regularly. ? Doing deep-breathing exercises. ? Doing yoga. ? Meditating. ? Performing a body scan. This involves closing your eyes, scanning your body from head to toe, and noticing which parts of your body are particularly tense. Try to relax the muscles in those areas. How will I feel when I quit smoking? Day 1 to 3 weeks Within the first 24 hours of quitting smoking,  you may start to feel withdrawal symptoms. These symptoms are usually most noticeable 2-3 days after quitting, but they usually do not last for more than 2-3 weeks. You may experience these symptoms:  Mood swings.  Restlessness, anxiety, or irritability.  Trouble concentrating.  Dizziness.  Strong cravings for sugary foods and nicotine.  Mild weight gain.  Constipation.  Nausea.  Coughing or a sore throat.  Changes in how the medicines that you take for unrelated issues work in your body.  Depression.  Trouble sleeping (insomnia). Week 3 and afterward After the  first 2-3 weeks of quitting, you may start to notice more positive results, such as:  Improved sense of smell and taste.  Decreased coughing and sore throat.  Slower heart rate.  Lower blood pressure.  Clearer skin.  The ability to breathe more easily.  Fewer sick days. Quitting smoking can be very challenging. Do not get discouraged if you are not successful the first time. Some people need to make many attempts to quit before they achieve long-term success. Do your best to stick to your quit plan, and talk with your health care provider if you have any questions or concerns. Summary  Smoking tobacco is the leading cause of preventable death. Quitting smoking is one of the best things that you can do for your health.  When you decide to quit smoking, create a plan to help you succeed.  Quit smoking right away, not slowly over a period of time.  When you start quitting, seek help from your health care provider, family, or friends. This information is not intended to replace advice given to you by your health care provider. Make sure you discuss any questions you have with your health care provider. Document Revised: 03/13/2019 Document Reviewed: 09/06/2018 Elsevier Patient Education  [place].

## 2019-09-23 ENCOUNTER — Other Ambulatory Visit: Payer: Self-pay

## 2019-09-23 NOTE — Progress Notes (Signed)
Subjective:    Patient ID: Richard [name], male    DOB: 18-Nov-1950, 69 y.o.   MRN: [id number]  HPI Patient presents for yearly preventative medicine examination. He is a pleasant 69 year old male who  has a past medical history of ANEMIA-NOS (12/06/2008), BACK PAIN (02/20/2010), DIABETES MELLITUS, TYPE II (06/01/2008), GERD (06/01/2008), HYPERLIPIDEMIA (06/01/2008), HYPERTENSION (06/01/2008), and Tremors of nervous system.  DM -is well controlled with metformin 500 mg daily and lifestyle modifications. Lab Results  Component Value Date   HGBA1C 5.2 01/13/2019   Essential Hypertension -currently prescribed lisinopril 40 mg and amlodipine 5 mg daily.  He does not monitor his blood pressures at home.  He denies dizziness, lightheadedness, headaches. BP Readings from Last 3 Encounters:  09/24/19 128/80  05/04/19 136/70  09/08/18 136/70   Hyperlipidemia -currently prescribed Lipitor 40 mg daily.  He has failed simvastatin in the past Lab Results  Component Value Date   CHOL 140 07/16/2018   HDL 54.60 07/16/2018   [place] 70 07/16/2018   TRIG 76.0 07/16/2018   CHOLHDL 3 07/16/2018   The 10-year ASCVD risk score Mikey Bussing DC Jr., et al., 2013) is: 28.8%   Values used to calculate the score:     Age: 64 years     Sex: Male     Is Non-Hispanic African American: No     Diabetic: Yes     Tobacco smoker: Yes     Systolic Blood Pressure: 0000000 mmHg     Is BP treated: Yes     HDL Cholesterol: 54.6 mg/dL     Total Cholesterol: 140 mg/dL  Tobacco Use -he has tried quitting multiple times in the past but eventually goes back to smoking.  He is tried Wellbutrin and Chantix in the past and felt as though this did nothing to help with him quitting smoking. He continues to smoke   IPF-is followed by pulmonary. His last CT Chest was earlier this month, which showed  IMPRESSION: 1. Unusual pattern of pulmonary fibrosis which appears very similar to the prior examination, as detailed above. Findings  remain indeterminate for usual interstitial pneumonia (UIP) per current ats guidelines. Given the unusual distribution and stability of findings, this is once again favored to reflect fibrotic phase nonspecific interstitial pneumonia (NSIP). 2. Aortic atherosclerosis, in addition to left main and 2 vessel coronary artery disease. Please note that although the presence of coronary artery calcium documents the presence of coronary artery disease, the severity of this disease and any potential stenosis cannot be assessed on this non-gated CT examination. Assessment for potential risk factor modification, dietary therapy or pharmacologic therapy may be warranted, if clinically indicated.   OSA-he has failed CPAP in the past.  Currently using positional therapy and avoiding sleeping in the supine position.  He is followed by pulmonary  All immunizations and health maintenance protocols were reviewed with the patient and needed orders were placed.  Appropriate screening laboratory values were ordered for the patient including screening of hyperlipidemia, renal function and hepatic function. If indicated by BPH, a PSA was ordered.  Medication reconciliation,  past medical history, social history, problem list and allergies were reviewed in detail with the patient  Goals were established with regard to weight loss, exercise, and  diet in compliance with medications  He is up to date on routine screening colonoscopy and diabetic vision exam  Review of Systems  Constitutional: Negative.   HENT: Negative.   Eyes: Negative.   Respiratory: Negative.   Cardiovascular: Negative.  Gastrointestinal: Negative.   Endocrine: Negative.   Genitourinary: Negative.   Musculoskeletal: Negative.   Skin: Negative.   Allergic/Immunologic: Negative.   Neurological: Negative.   Hematological: Negative.   Psychiatric/Behavioral: Negative.   All other systems reviewed and are negative.  Past Medical  History:  Diagnosis Date  . ANEMIA-NOS 12/06/2008  . BACK PAIN 02/20/2010  . DIABETES MELLITUS, TYPE II 06/01/2008  . GERD 06/01/2008  . HYPERLIPIDEMIA 06/01/2008  . HYPERTENSION 06/01/2008  . Tremors of nervous system    pt states he has had tremors most of his life in his hands/hereditary    Social History   Socioeconomic History  . Marital status: Married    Spouse name: Not on file  . Number of children: Not on file  . Years of education: Not on file  . Highest education level: Not on file  Occupational History  . Not on file  Tobacco Use  . Smoking status: Current Every Day Smoker    Packs/day: 2.00    Years: 35.00    Pack years: 70.00    Types: Cigarettes    Last attempt to quit: 06/08/2018    Years since quitting: 1.2  . Smokeless tobacco: Never Used  . Tobacco comment: down to 1.5 ppd X8 mos.  plans to quit on upcoming birthday  Substance and Sexual Activity  . Alcohol use: Yes    Alcohol/week: 2.0 - 3.0 standard drinks    Types: 2 - 3 Standard drinks or equivalent per week  . Drug use: No  . Sexual activity: Not on file  Other Topics Concern  . Not on file  Social History Narrative  . Not on file   Social Determinants of Health   Financial Resource Strain:   . Difficulty of Paying Living Expenses:   Food Insecurity:   . Worried About Charity fundraiser in the Last Year:   . Arboriculturist in the Last Year:   Transportation Needs:   . Film/video editor (Medical):   Marland Kitchen Lack of Transportation (Non-Medical):   Physical Activity:   . Days of Exercise per Week:   . Minutes of Exercise per Session:   Stress:   . Feeling of Stress :   Social Connections:   . Frequency of Communication with Friends and Family:   . Frequency of Social Gatherings with Friends and Family:   . Attends Religious Services:   . Active Member of Clubs or Organizations:   . Attends Archivist Meetings:   Marland Kitchen Marital Status:   Intimate Partner Violence:   . Fear of  Current or Ex-Partner:   . Emotionally Abused:   Marland Kitchen Physically Abused:   . Sexually Abused:     Past Surgical History:  Procedure Laterality Date  . COLONOSCOPY    . ELBOW BURSA SURGERY     right elbow  . ELBOW BURSA SURGERY Left 11/12/2018   Pt had left olecranon bursectomy  . HAND SURGERY     2 surgeries on right hand  . HAND SURGERY     left hand/removed a bone    Family History  Problem Relation Age of Onset  . Diabetes Sister   . Alzheimer's disease Sister   . Diabetes Brother   . Heart disease Mother   . Sudden death Father        Suicide after his wife passed away   . Colon cancer Neg Hx     No Known Allergies  Current Outpatient Medications on  File Prior to Visit  Medication Sig Dispense Refill  . amLODipine (NORVASC) 5 MG tablet TAKE 1 TABLET(5 MG) BY MOUTH DAILY 90 tablet 3  . Ascorbic Acid (VITAMIN C) 1000 MG tablet Take 1,000 mg by mouth daily.    Marland Kitchen aspirin EC 81 MG tablet Take 81 mg by mouth daily.    Marland Kitchen atorvastatin (LIPITOR) 40 MG tablet TAKE 1 TABLET(40 MG) BY MOUTH DAILY 90 tablet 0  . cholecalciferol (VITAMIN D3) 25 MCG (1000 UNIT) tablet Take 1,000 Units by mouth daily.    Marland Kitchen ibuprofen (ADVIL) 200 MG tablet Take 200 mg by mouth 3 (three) times daily.    Marland Kitchen lisinopril (ZESTRIL) 40 MG tablet TAKE 1 TABLET(40 MG) BY MOUTH DAILY 90 tablet 3  . metFORMIN (GLUCOPHAGE) 500 MG tablet TAKE 1 TABLET(500 MG) BY MOUTH AT BEDTIME 90 tablet 0  . omeprazole (PRILOSEC) 20 MG capsule TAKE 1 CAPSULE(20 MG) BY MOUTH DAILY (Patient taking differently: Take 20 mg by mouth every other day. ) 90 capsule 0  . sildenafil (VIAGRA) 100 MG tablet Take 1 tablet (100 mg total) by mouth daily as needed for erectile dysfunction. 10 tablet 6   No current facility-administered medications on file prior to visit.    BP 128/80   Temp 98 F (36.7 C)   Ht 5\' 7"  (1.702 m)   Wt 198 lb (89.8 kg)   BMI 31.01 kg/m       Objective:   Physical Exam Vitals and nursing note reviewed.   Constitutional:      General: He is not in acute distress.    Appearance: Normal appearance. He is well-developed and overweight.  HENT:     Head: Normocephalic and atraumatic.     Right Ear: Tympanic membrane, ear canal and external ear normal. There is no impacted cerumen.     Left Ear: Tympanic membrane, ear canal and external ear normal. There is no impacted cerumen.     Nose: Nose normal. No congestion or rhinorrhea.     Mouth/Throat:     Mouth: Mucous membranes are moist.     Pharynx: Oropharynx is clear. No oropharyngeal exudate or posterior oropharyngeal erythema.  Eyes:     General:        Right eye: No discharge.        Left eye: No discharge.     Extraocular Movements: Extraocular movements intact.     Conjunctiva/sclera: Conjunctivae normal.     Pupils: Pupils are equal, round, and reactive to light.  Neck:     Vascular: No carotid bruit.     Trachea: No tracheal deviation.  Cardiovascular:     Rate and Rhythm: Normal rate and regular rhythm.     Pulses: Normal pulses.     Heart sounds: Normal heart sounds. No murmur. No friction rub. No gallop.   Pulmonary:     Effort: Pulmonary effort is normal. No respiratory distress.     Breath sounds: Normal breath sounds. No stridor. No wheezing, rhonchi or rales.  Chest:     Chest wall: No tenderness.  Abdominal:     General: Bowel sounds are normal. There is no distension.     Palpations: Abdomen is soft. There is no mass.     Tenderness: There is no abdominal tenderness. There is no right CVA tenderness, left CVA tenderness, guarding or rebound.     Hernia: No hernia is present.  Musculoskeletal:        General: No swelling, tenderness, deformity or signs of  injury. Normal range of motion.     Right lower leg: No edema.     Left lower leg: No edema.  Lymphadenopathy:     Cervical: No cervical adenopathy.  Skin:    General: Skin is warm and dry.     Capillary Refill: Capillary refill takes less than 2 seconds.      Coloration: Skin is not jaundiced or pale.     Findings: No bruising, erythema, lesion or rash.  Neurological:     General: No focal deficit present.     Mental Status: He is alert and oriented to person, place, and time.     Cranial Nerves: No cranial nerve deficit.     Sensory: No sensory deficit.     Motor: No weakness.     Coordination: Coordination normal.     Gait: Gait normal.     Deep Tendon Reflexes: Reflexes normal.  Psychiatric:        Mood and Affect: Mood normal.        Behavior: Behavior normal.        Thought Content: Thought content normal.        Judgment: Judgment normal.       Assessment & Plan:  1. Routine general medical examination at a health care facility - Follow up in one year or sooner if needed - Encouraged diet and exericse - Quit smoking  - CBC with Differential/Platelet; Future - Comprehensive metabolic panel; Future - Hemoglobin A1c; Future - Lipid panel; Future - TSH; Future  2. Essential hypertension - No change in medications  - CBC with Differential/Platelet; Future - Comprehensive metabolic panel; Future - Hemoglobin A1c; Future - Lipid panel; Future - TSH; Future  3. Type 2 diabetes mellitus with other diabetic kidney complication, without long-term current use of insulin (HCC) - Consider increase in Metformin  - CBC with Differential/Platelet; Future - Comprehensive metabolic panel; Future - Hemoglobin A1c; Future - Lipid panel; Future - TSH; Future  4. Prostate cancer screening  - PSA; Future  5. Tobacco use - Encouraged to quit smoking   7. IPF (idiopathic pulmonary fibrosis) ([place]) - Follow up with pulmonary as directed - Needs to quit smoking   8. OSA (obstructive sleep apnea) - Continue with positional therapy. Follow up with pulmonary as directed   [name], NP

## 2019-09-24 ENCOUNTER — Ambulatory Visit (INDEPENDENT_AMBULATORY_CARE_PROVIDER_SITE_OTHER): Payer: PPO | Admitting: Adult Health

## 2019-09-24 ENCOUNTER — Encounter: Payer: Self-pay | Admitting: Adult Health

## 2019-09-24 VITALS — BP 128/80 | Temp 98.0°F | Ht 67.0 in | Wt 198.0 lb

## 2019-09-24 DIAGNOSIS — G4733 Obstructive sleep apnea (adult) (pediatric): Secondary | ICD-10-CM

## 2019-09-24 DIAGNOSIS — Z0000 Encounter for general adult medical examination without abnormal findings: Secondary | ICD-10-CM

## 2019-09-24 DIAGNOSIS — I10 Essential (primary) hypertension: Secondary | ICD-10-CM | POA: Diagnosis not present

## 2019-09-24 DIAGNOSIS — Z125 Encounter for screening for malignant neoplasm of prostate: Secondary | ICD-10-CM | POA: Diagnosis not present

## 2019-09-24 DIAGNOSIS — E1129 Type 2 diabetes mellitus with other diabetic kidney complication: Secondary | ICD-10-CM

## 2019-09-24 DIAGNOSIS — J84112 Idiopathic pulmonary fibrosis: Secondary | ICD-10-CM | POA: Diagnosis not present

## 2019-09-24 DIAGNOSIS — Z720 Tobacco use: Secondary | ICD-10-CM

## 2019-09-24 NOTE — Patient Instructions (Addendum)
It was great seeing you today   Please schedule a lab visit and I will follow up with you regarding your blood work   Please schedule a diabetic follow up in 6 months

## 2019-09-25 ENCOUNTER — Other Ambulatory Visit (INDEPENDENT_AMBULATORY_CARE_PROVIDER_SITE_OTHER): Payer: PPO

## 2019-09-25 ENCOUNTER — Other Ambulatory Visit: Payer: Self-pay

## 2019-09-25 DIAGNOSIS — Z0000 Encounter for general adult medical examination without abnormal findings: Secondary | ICD-10-CM | POA: Diagnosis not present

## 2019-09-25 DIAGNOSIS — I10 Essential (primary) hypertension: Secondary | ICD-10-CM

## 2019-09-25 DIAGNOSIS — E1129 Type 2 diabetes mellitus with other diabetic kidney complication: Secondary | ICD-10-CM | POA: Diagnosis not present

## 2019-09-25 DIAGNOSIS — Z125 Encounter for screening for malignant neoplasm of prostate: Secondary | ICD-10-CM

## 2019-09-25 LAB — COMPREHENSIVE METABOLIC PANEL
ALT: 18 U/L (ref 0–53)
AST: 24 U/L (ref 0–37)
Albumin: 4.5 g/dL (ref 3.5–5.2)
Alkaline Phosphatase: 79 U/L (ref 39–117)
BUN: 17 mg/dL (ref 6–23)
CO2: 26 mEq/L (ref 19–32)
Calcium: 9.6 mg/dL (ref 8.4–10.5)
Chloride: 101 mEq/L (ref 96–112)
Creatinine, Ser: 1.39 mg/dL (ref 0.40–1.50)
GFR: 51.08 mL/min — ABNORMAL LOW (ref >60.00)
Glucose, Bld: 104 mg/dL — ABNORMAL HIGH (ref 70–99)
Potassium: 5.1 mEq/L (ref 3.5–5.1)
Sodium: 134 mEq/L — ABNORMAL LOW (ref 135–145)
Total Bilirubin: 1 mg/dL (ref 0.2–1.2)
Total Protein: 7.2 g/dL (ref 6.0–8.3)

## 2019-09-25 LAB — CBC WITH DIFFERENTIAL/PLATELET
Basophils Absolute: 0.1 10*3/uL (ref 0.0–0.1)
Basophils Relative: 0.8 % (ref 0.0–3.0)
Eosinophils Absolute: 0.2 10*3/uL (ref 0.0–0.7)
Eosinophils Relative: 2.8 % (ref 0.0–5.0)
HCT: 43.6 % (ref 39.0–52.0)
Hemoglobin: 15.5 g/dL (ref 13.0–17.0)
Lymphocytes Relative: 31.8 % (ref 12.0–46.0)
Lymphs Abs: 2.2 10*3/uL (ref 0.7–4.0)
MCHC: 35.5 g/dL (ref 30.0–36.0)
MCV: 109.7 fl — ABNORMAL HIGH (ref 78.0–100.0)
Monocytes Absolute: 0.6 10*3/uL (ref 0.1–1.0)
Monocytes Relative: 8.3 % (ref 3.0–12.0)
Neutro Abs: 3.9 10*3/uL (ref 1.4–7.7)
Neutrophils Relative %: 56.3 % (ref 43.0–77.0)
Platelets: 210 10*3/uL (ref 150.0–400.0)
RBC: 3.97 Mil/uL — ABNORMAL LOW (ref 4.22–5.81)
RDW: 13.1 % (ref 11.5–15.5)
WBC: 6.9 10*3/uL (ref 4.0–10.5)

## 2019-09-25 LAB — TSH: TSH: 1.04 u[IU]/mL (ref 0.35–4.50)

## 2019-09-25 LAB — LIPID PANEL
Cholesterol: 127 mg/dL (ref 0–200)
HDL: 60.9 mg/dL (ref >39.00)
LDL Cholesterol: 49 mg/dL (ref 0–99)
NonHDL: 65.89
Total CHOL/HDL Ratio: 2
Triglycerides: 83 mg/dL (ref 0.0–149.0)
VLDL: 16.6 mg/dL (ref 0.0–40.0)

## 2019-09-25 LAB — PSA: PSA: 1.19 ng/mL (ref 0.10–4.00)

## 2019-09-25 LAB — HEMOGLOBIN A1C: Hgb A1c MFr Bld: 5.7 % (ref 4.6–6.5)

## 2019-09-30 ENCOUNTER — Ambulatory Visit: Payer: PPO | Attending: Internal Medicine

## 2019-09-30 DIAGNOSIS — Z23 Encounter for immunization: Secondary | ICD-10-CM

## 2019-09-30 NOTE — Progress Notes (Signed)
   Covid-19 Vaccination Clinic  Name:  Richard [name]    MRN: [id number] DOB: [birth year]  09/30/2019  Richard [name] was observed post Covid-19 immunization for 15 minutes without incident. He was provided with Vaccine Information Sheet and instruction to access the V-Safe system.   Richard [name] was instructed to call 911 with any severe reactions post vaccine: Marland Kitchen Difficulty breathing  . Swelling of face and throat  . A fast heartbeat  . A bad rash all over body  . Dizziness and weakness   Immunizations Administered    Name Date Dose VIS Date Route   Pfizer COVID-19 Vaccine 09/30/2019 11:39 AM 0.3 mL 06/12/2019 Intramuscular   Manufacturer: [employer]   Lot: [id number]   [place]: [id number]

## 2019-10-06 ENCOUNTER — Other Ambulatory Visit: Payer: Self-pay | Admitting: Adult Health

## 2019-10-06 DIAGNOSIS — E1329 Other specified diabetes mellitus with other diabetic kidney complication: Secondary | ICD-10-CM

## 2019-10-07 NOTE — Telephone Encounter (Signed)
Sent to the pharmacy by e-scribe. 

## 2019-10-16 ENCOUNTER — Other Ambulatory Visit: Payer: Self-pay | Admitting: Adult Health

## 2019-10-16 DIAGNOSIS — K2100 Gastro-esophageal reflux disease with esophagitis, without bleeding: Secondary | ICD-10-CM

## 2019-10-16 NOTE — Telephone Encounter (Signed)
Sent to the pharmacy by e-scribe. 

## 2020-01-21 DIAGNOSIS — M79671 Pain in right foot: Secondary | ICD-10-CM | POA: Diagnosis not present

## 2020-02-01 ENCOUNTER — Telehealth: Payer: Self-pay

## 2020-02-01 NOTE — Progress Notes (Signed)
Chronic Care Management Pharmacy Assistant   Name: Richard [name]  MRN: [id number] DOB: 01-25-1951  Reason for Encounter: Disease State  Patient Questions:  1.  Have you seen any other providers since your last visit? Yes.  OV 09-24-2019. Patient presented in the office with Dr. Tylene Fantasia routine general medical examination. Was encouraged to diet and exercise, and to quit smoking. There were no significant findings.  2.  Any changes in your medicines or health? No   PCP : [name], NP  Allergies:  No Known Allergies  Medications: Outpatient Encounter Medications as of 02/01/2020  Medication Sig   omeprazole (PRILOSEC) 20 MG capsule TAKE 1 CAPSULE(20 MG) BY MOUTH DAILY   amLODipine (NORVASC) 5 MG tablet TAKE 1 TABLET(5 MG) BY MOUTH DAILY   Ascorbic Acid (VITAMIN C) 1000 MG tablet Take 1,000 mg by mouth daily.   aspirin EC 81 MG tablet Take 81 mg by mouth daily.   atorvastatin (LIPITOR) 40 MG tablet TAKE 1 TABLET(40 MG) BY MOUTH DAILY   cholecalciferol (VITAMIN D3) 25 MCG (1000 UNIT) tablet Take 1,000 Units by mouth daily.   ibuprofen (ADVIL) 200 MG tablet Take 200 mg by mouth 3 (three) times daily.   lisinopril (ZESTRIL) 40 MG tablet TAKE 1 TABLET(40 MG) BY MOUTH DAILY   metFORMIN (GLUCOPHAGE) 500 MG tablet TAKE 1 TABLET(500 MG) BY MOUTH AT BEDTIME   sildenafil (VIAGRA) 100 MG tablet Take 1 tablet (100 mg total) by mouth daily as needed for erectile dysfunction.   No facility-administered encounter medications on file as of 02/01/2020.    Current Diagnosis: Patient Active Problem List   Diagnosis Date Noted   IPF (idiopathic pulmonary fibrosis) ([place]) 07/23/2018   Pulmonary nodule 07/23/2018   OSA (obstructive sleep apnea) 06/18/2018   Alcohol abuse 10/31/2016   Chronic renal insufficiency, stage II (mild) 03/22/2014   Benign familial tremor 03/22/2014   Macrocytosis without anemia 12/31/2011   Tobacco use 12/31/2011   BACK PAIN 02/20/2010    Diabetes mellitus with renal complications ([place]) 50/93/2671   Dyslipidemia 06/01/2008   Essential hypertension 06/01/2008   GERD 06/01/2008    Goals Addressed   None     Follow-Up: Disease State Call Reviewed chart prior to disease state call. Spoke with patient regarding BP  Recent Office Vitals: BP Readings from Last 3 Encounters:  09/24/19 128/80  05/04/19 136/70  09/08/18 136/70   Pulse Readings from Last 3 Encounters:  05/04/19 84  09/08/18 80  07/29/18 72    Wt Readings from Last 3 Encounters:  09/24/19 198 lb (89.8 kg)  05/04/19 199 lb (90.3 kg)  07/29/18 194 lb 3.2 oz (88.1 kg)     Kidney Function Lab Results  Component Value Date/Time   CREATININE 1.39 09/25/2019 08:33 AM   CREATININE 1.35 07/16/2018 07:59 AM   GFR 51.08 (L) 09/25/2019 08:33 AM   GFRNONAA 69.80 11/30/2009 08:51 AM    BMP Latest Ref Rng & Units 09/25/2019 07/16/2018 10/30/2017  Glucose 70 - 99 mg/dL 104(H) 104(H) 104(H)  BUN 6 - 23 mg/dL 17 15 13   Creatinine 0.40 - 1.50 mg/dL 1.39 1.35 1.53(H)  Sodium 135 - 145 mEq/L 134(L) 134(L) 135  Potassium 3.5 - 5.1 mEq/L 5.1 4.7 4.6  Chloride 96 - 112 mEq/L 101 100 101  CO2 19 - 32 mEq/L 26 28 27   Calcium 8.4 - 10.5 mg/dL 9.6 9.8 9.5     Current antihypertensive regimen:  o Amlodipine Besylate 5mg , Lisinopril 40mg    How often are you  checking your Blood Pressure? Patient states he is not checking his BP because it always seem to run at a normal rate when he goes to the doctor's. He did agree to start checking his BP once a week for a baseline.  What recent interventions/DTPs have been made by any provider to improve Blood Pressure control since last CPP Visit: none  Patient states he does not take his blood sugar because his glucometer is broken. He requested a new glucometer and test strips so that he can begin to check his BSL more at home.  Any recent hospitalizations or ED visits since last visit with CPP? No  What diet changes have  been made to improve Blood Pressure Control?   Was encouraged to diet and exercise, and to quit smoking.  What exercise is being done to improve your Blood Pressure Control?  o Patient states he is not doing any exercise. He also states he is having a hard time quitting smoking.  Adherence Review: Is the patient currently on ACE/ARB medication? Yes Does the patient have >5 day gap between last estimated fill dates? No   Lafonda Mosses

## 2020-02-08 ENCOUNTER — Other Ambulatory Visit: Payer: Self-pay | Admitting: Adult Health

## 2020-02-08 MED ORDER — FREESTYLE SYSTEM KIT: 1.0000 | PACK | 0 refills | Status: DC | PRN

## 2020-02-08 MED ORDER — FREESTYLE SYSTEM KIT: PACK | 0 refills | Status: DC

## 2020-02-08 MED ORDER — FREESTYLE TEST VI STRP
ORAL_STRIP | 12 refills | Status: DC
Start: 2020-02-08 — End: 2020-11-29

## 2020-02-08 MED ORDER — FREESTYLE LANCETS MISC: 12 refills | Status: AC

## 2020-02-08 NOTE — Addendum Note (Signed)
Addended by: Miles Costain T on: 02/08/2020 02:26 PM   Modules accepted: Orders

## 2020-02-08 NOTE — Addendum Note (Signed)
Addended by: Miles Costain T on: 02/08/2020 02:30 PM   Modules accepted: Orders

## 2020-02-08 NOTE — Addendum Note (Signed)
Addended by: Miles Costain T on: 02/08/2020 11:47 AM   Modules accepted: Orders

## 2020-03-23 ENCOUNTER — Other Ambulatory Visit: Payer: Self-pay

## 2020-03-23 ENCOUNTER — Ambulatory Visit (INDEPENDENT_AMBULATORY_CARE_PROVIDER_SITE_OTHER): Payer: Medicare (Managed Care) | Admitting: Adult Health

## 2020-03-23 ENCOUNTER — Encounter: Payer: Self-pay | Admitting: Adult Health

## 2020-03-23 VITALS — BP 134/78 | HR 84 | Temp 98.2°F | Wt 184.2 lb

## 2020-03-23 DIAGNOSIS — G250 Essential tremor: Secondary | ICD-10-CM

## 2020-03-23 DIAGNOSIS — E1129 Type 2 diabetes mellitus with other diabetic kidney complication: Secondary | ICD-10-CM

## 2020-03-23 LAB — POCT GLYCOSYLATED HEMOGLOBIN (HGB A1C): Hemoglobin A1C: 5.5 % (ref 4.0–5.6)

## 2020-03-23 MED ORDER — METFORMIN HCL 500 MG PO TABS: 250.0000 mg | ORAL_TABLET | Freq: Every day | ORAL | 0 refills | Status: DC

## 2020-03-23 MED ORDER — GABAPENTIN 300 MG PO CAPS: 300.0000 mg | ORAL_CAPSULE | Freq: Every day | ORAL | 0 refills | Status: DC

## 2020-03-23 NOTE — Patient Instructions (Signed)
It was great seeing you today   Your A1c was 5.5. I am going to have you take 1/2 tab of Metformin and follow up in 3 months   I am also going to prescribe a medication called Gabapentin - you will take one tab at bedtime. We will reassess this at your next visit as well

## 2020-03-23 NOTE — Progress Notes (Signed)
Subjective:    Patient ID: Richard [name], male    DOB: 1951-02-01, 69 y.o.   MRN: [id number]  HPI  69 year old male who  has a past medical history of ANEMIA-NOS (12/06/2008), BACK PAIN (02/20/2010), DIABETES MELLITUS, TYPE II (06/01/2008), GERD (06/01/2008), HYPERLIPIDEMIA (06/01/2008), HYPERTENSION (06/01/2008), and Tremors of nervous system.  He presents to the office today for six month follow up regarding DM. He has been well controlled on Metformin 500 mg daily. His last A1c six months ago was 5.7.   His biggest complaint is that of essential tremor that he has had for most of his life. He feels as though the tremors in his upper extremities are getting worse and he is having a hard time drinking fluids and feeding himself. He will have alcohol in the evening which seems to calm down his tremor.  Does report that his father had an essential tremor and so does his daughter.  In the past he has tried a beta-blocker that he believes was propanolol which did not produce any results  Review of Systems See HPI   Past Medical History:  Diagnosis Date   ANEMIA-NOS 12/06/2008   BACK PAIN 02/20/2010   DIABETES MELLITUS, TYPE II 06/01/2008   GERD 06/01/2008   HYPERLIPIDEMIA 06/01/2008   HYPERTENSION 06/01/2008   Tremors of nervous system    pt states he has had tremors most of his life in his hands/hereditary    Social History   Socioeconomic History   Marital status: Married    Spouse name: Not on file   Number of children: Not on file   Years of education: Not on file   Highest education level: Not on file  Occupational History   Not on file  Tobacco Use   Smoking status: Current Every Day Smoker    Packs/day: 2.00    Years: 35.00    Pack years: 70.00    Types: Cigarettes    Last attempt to quit: 06/08/2018    Years since quitting: 1.7   Smokeless tobacco: Never Used   Tobacco comment: down to 1.5 ppd X8 mos.  plans to quit on upcoming birthday  Vaping Use   Vaping  Use: Never used  Substance and Sexual Activity   Alcohol use: Yes    Alcohol/week: 2.0 - 3.0 standard drinks    Types: 2 - 3 Standard drinks or equivalent per week   Drug use: No   Sexual activity: Not on file  Other Topics Concern   Not on file  Social History Narrative   Not on file   Social Determinants of Health   Financial Resource Strain:    Difficulty of Paying Living Expenses: Not on file  Food Insecurity:    Worried About [place] in the Last Year: Not on file   Ran Out of Food in the Last Year: Not on file  Transportation Needs:    Lack of Transportation (Medical): Not on file   Lack of Transportation (Non-Medical): Not on file  Physical Activity:    Days of Exercise per Week: Not on file   Minutes of Exercise per Session: Not on file  Stress:    Feeling of Stress : Not on file  Social Connections:    Frequency of Communication with Friends and Family: Not on file   Frequency of Social Gatherings with Friends and Family: Not on file   Attends Religious Services: Not on file   Active Member of Clubs or Organizations:  Not on file   Attends Club or Organization Meetings: Not on file   Marital Status: Not on file  Intimate Partner Violence:    Fear of Current or Ex-Partner: Not on file   Emotionally Abused: Not on file   Physically Abused: Not on file   Sexually Abused: Not on file    Past Surgical History:  Procedure Laterality Date   COLONOSCOPY     ELBOW BURSA SURGERY     right elbow   ELBOW BURSA SURGERY Left 11/12/2018   Pt had left olecranon bursectomy   HAND SURGERY     2 surgeries on right hand   HAND SURGERY     left hand/removed a bone    Family History  Problem Relation Age of Onset   Diabetes Sister    Alzheimer's disease Sister    Diabetes Brother    Heart disease Mother    Sudden death Father        Suicide after his wife passed away    Colon cancer Neg Hx     No Known  Allergies  Current Outpatient Medications on File Prior to Visit  Medication Sig Dispense Refill   amLODipine (NORVASC) 5 MG tablet TAKE 1 TABLET(5 MG) BY MOUTH DAILY 90 tablet 3   Ascorbic Acid (VITAMIN C) 1000 MG tablet Take 1,000 mg by mouth daily.     aspirin EC 81 MG tablet Take 81 mg by mouth daily.     atorvastatin (LIPITOR) 40 MG tablet TAKE 1 TABLET(40 MG) BY MOUTH DAILY 90 tablet 3   cholecalciferol (VITAMIN D3) 25 MCG (1000 UNIT) tablet Take 1,000 Units by mouth daily.     glucose blood (FREESTYLE TEST STRIPS) test strip Use as instructed 100 each 12   glucose monitoring kit (FREESTYLE) monitoring kit USE TO TEST BLOOD GLUCOSE DAILY. 1 each 0   ibuprofen (ADVIL) 200 MG tablet Take 200 mg by mouth 3 (three) times daily.     Lancets (FREESTYLE) lancets Use as instructed 100 each 12   lisinopril (ZESTRIL) 40 MG tablet TAKE 1 TABLET(40 MG) BY MOUTH DAILY 90 tablet 3   metFORMIN (GLUCOPHAGE) 500 MG tablet TAKE 1 TABLET(500 MG) BY MOUTH AT BEDTIME 90 tablet 1   omeprazole (PRILOSEC) 20 MG capsule TAKE 1 CAPSULE(20 MG) BY MOUTH DAILY 90 capsule 3   sildenafil (VIAGRA) 100 MG tablet Take 1 tablet (100 mg total) by mouth daily as needed for erectile dysfunction. 10 tablet 6   No current facility-administered medications on file prior to visit.    BP 134/78 (BP Location: Left Arm, Patient Position: Sitting, Cuff Size: Normal)    Pulse 84    Temp 98.2 F (36.8 C) (Oral)    Wt 184 lb 3.2 oz (83.6 kg)    SpO2 99%    BMI 28.85 kg/m       Objective:   Physical Exam Vitals and nursing note reviewed.  Constitutional:      Appearance: Normal appearance.  Cardiovascular:     Rate and Rhythm: Normal rate and regular rhythm.     Pulses: Normal pulses.     Heart sounds: Normal heart sounds.  Pulmonary:     Effort: Pulmonary effort is normal.     Breath sounds: Normal breath sounds.  Abdominal:     Palpations: Abdomen is soft.  Musculoskeletal:        General: Normal range  of motion.  Skin:    General: Skin is warm and dry.  Neurological:  General: No focal deficit present.     Mental Status: He is alert and oriented to person, place, and time.     Motor: Tremor (bilateral upper extremity ) present.  Psychiatric:        Mood and Affect: Mood normal.        Behavior: Behavior normal.        Thought Content: Thought content normal.        Judgment: Judgment normal.       Assessment & Plan:  1. Type 2 diabetes mellitus with other diabetic kidney complication, without long-term current use of insulin (HCC)  - POC HgB A1c- 5.5 -decrease Metformin to 250 mg daily and follow-up in 3 months - metFORMIN (GLUCOPHAGE) 500 MG tablet; Take 0.5 tablets (250 mg total) by mouth at bedtime.  Dispense: 45 tablet; Refill: 0  2. Essential tremor -Gabapentin will have less potential for drug interactions than mysoline.  - last GFR 51, - gabapentin (NEURONTIN) 300 MG capsule; Take 1 capsule (300 mg total) by mouth at bedtime.  Dispense: 90 capsule; Refill: 0 -We will follow-up in 90 days or sooner if needed  [name], NP

## 2020-03-29 ENCOUNTER — Ambulatory Visit: Payer: PPO | Admitting: Adult Health

## 2020-05-06 ENCOUNTER — Ambulatory Visit (INDEPENDENT_AMBULATORY_CARE_PROVIDER_SITE_OTHER): Payer: Medicare (Managed Care)

## 2020-05-06 DIAGNOSIS — Z0000 Encounter for general adult medical examination without abnormal findings: Secondary | ICD-10-CM

## 2020-05-06 DIAGNOSIS — F17200 Nicotine dependence, unspecified, uncomplicated: Secondary | ICD-10-CM | POA: Diagnosis not present

## 2020-05-06 NOTE — Progress Notes (Signed)
Subjective:   Richard [name] is a 69 y.o. male who presents for Medicare Annual/Subsequent preventive examination.  I connected with Richard [name] today by telephone and verified that I am speaking with the correct person using two identifiers. Location patient: home Location provider: work Persons participating in the virtual visit: patient, provider.   I discussed the limitations, risks, security and privacy concerns of performing an evaluation and management service by telephone and the availability of in person appointments. I also discussed with the patient that there may be a patient responsible charge related to this service. The patient expressed understanding and verbally consented to this telephonic visit.    Interactive audio and video telecommunications were attempted between this provider and patient, however failed, due to patient having technical difficulties OR patient did not have access to video capability.  We continued and completed visit with audio only.      Review of Systems    N/A  Cardiac Risk Factors include: advanced age (>35men, >73 women);hypertension;male gender     Objective:    Today's Vitals   There is no height or weight on file to calculate BMI.  Advanced Directives 05/06/2020  Does Patient Have a Medical Advance Directive? No  Would patient like information on creating a medical advance directive? No - Patient declined    Current Medications (verified) Outpatient Encounter Medications as of 05/06/2020  Medication Sig  . amLODipine (NORVASC) 5 MG tablet TAKE 1 TABLET(5 MG) BY MOUTH DAILY  . Ascorbic Acid (VITAMIN C) 1000 MG tablet Take 1,000 mg by mouth daily.  Marland Kitchen aspirin EC 81 MG tablet Take 81 mg by mouth daily.  Marland Kitchen atorvastatin (LIPITOR) 40 MG tablet TAKE 1 TABLET(40 MG) BY MOUTH DAILY  . cholecalciferol (VITAMIN D3) 25 MCG (1000 UNIT) tablet Take 1,000 Units by mouth daily.  Marland Kitchen gabapentin (NEURONTIN) 300 MG capsule Take 1 capsule (300 mg  total) by mouth at bedtime.  Marland Kitchen glucose blood (FREESTYLE TEST STRIPS) test strip Use as instructed  . glucose monitoring kit (FREESTYLE) monitoring kit USE TO TEST BLOOD GLUCOSE DAILY.  Marland Kitchen ibuprofen (ADVIL) 200 MG tablet Take 200 mg by mouth 3 (three) times daily.  . Lancets (FREESTYLE) lancets Use as instructed  . lisinopril (ZESTRIL) 40 MG tablet TAKE 1 TABLET(40 MG) BY MOUTH DAILY  . metFORMIN (GLUCOPHAGE) 500 MG tablet Take 0.5 tablets (250 mg total) by mouth at bedtime.  Marland Kitchen omeprazole (PRILOSEC) 20 MG capsule TAKE 1 CAPSULE(20 MG) BY MOUTH DAILY  . [DISCONTINUED] sildenafil (VIAGRA) 100 MG tablet Take 1 tablet (100 mg total) by mouth daily as needed for erectile dysfunction.   No facility-administered encounter medications on file as of 05/06/2020.    Allergies (verified) Patient has no known allergies.   History: Past Medical History:  Diagnosis Date  . ANEMIA-NOS 12/06/2008  . BACK PAIN 02/20/2010  . DIABETES MELLITUS, TYPE II 06/01/2008  . GERD 06/01/2008  . HYPERLIPIDEMIA 06/01/2008  . HYPERTENSION 06/01/2008  . Tremors of nervous system    pt states he has had tremors most of his life in his hands/hereditary   Past Surgical History:  Procedure Laterality Date  . COLONOSCOPY    . ELBOW BURSA SURGERY     right elbow  . ELBOW BURSA SURGERY Left 11/12/2018   Pt had left olecranon bursectomy  . HAND SURGERY     2 surgeries on right hand  . HAND SURGERY     left hand/removed a bone   Family History  Problem Relation Age  of Onset  . Diabetes Sister   . Alzheimer's disease Sister   . Diabetes Brother   . Heart disease Mother   . Sudden death Father        Suicide after his wife passed away   . Colon cancer Neg Hx    Social History   Socioeconomic History  . Marital status: Married    Spouse name: Not on file  . Number of children: Not on file  . Years of education: Not on file  . Highest education level: Not on file  Occupational History  . Not on file  Tobacco Use   . Smoking status: Current Every Day Smoker    Packs/day: 2.00    Years: 35.00    Pack years: 70.00    Types: Cigarettes    Last attempt to quit: 06/08/2018    Years since quitting: 1.9  . Smokeless tobacco: Never Used  . Tobacco comment: down to 1.5 ppd X8 mos.  plans to quit on upcoming birthday  Vaping Use  . Vaping Use: Never used  Substance and Sexual Activity  . Alcohol use: Yes    Alcohol/week: 2.0 - 3.0 standard drinks    Types: 2 - 3 Standard drinks or equivalent per week  . Drug use: No  . Sexual activity: Not on file  Other Topics Concern  . Not on file  Social History Narrative  . Not on file   Social Determinants of Health   Financial Resource Strain: Low Risk   . Difficulty of Paying Living Expenses: Not hard at all  Food Insecurity: No Food Insecurity  . Worried About Programme researcher, broadcasting/film/video in the Last Year: Never true  . Ran Out of Food in the Last Year: Never true  Transportation Needs: No Transportation Needs  . Lack of Transportation (Medical): No  . Lack of Transportation (Non-Medical): No  Physical Activity: Inactive  . Days of Exercise per Week: 0 days  . Minutes of Exercise per Session: 0 min  Stress: No Stress Concern Present  . Feeling of Stress : Only a little  Social Connections: Moderately Isolated  . Frequency of Communication with Friends and Family: More than three times a week  . Frequency of Social Gatherings with Friends and Family: More than three times a week  . Attends Religious Services: Never  . Active Member of Clubs or Organizations: No  . Attends Banker Meetings: Never  . Marital Status: Married    Tobacco Counseling Ready to quit: Not Answered Counseling given: Not Answered Comment: down to 1.5 ppd X8 mos.  plans to quit on upcoming birthday   Clinical Intake:  Pre-visit preparation completed: Yes  Pain : No/denies pain     Nutritional Risks: None Diabetes: No  How often do you need to have someone  help you when you read instructions, pamphlets, or other written materials from your doctor or pharmacy?: 1 - Never What is the last grade level you completed in school?: 12th grade  Diabetic?Yes Nutrition Risk Assessment:  Has the patient had any N/V/D within the last 2 months?  No  Does the patient have any non-healing wounds?  No  Has the patient had any unintentional weight loss or weight gain?  No   Diabetes:  Is the patient diabetic?  Yes  If diabetic, was a CBG obtained today?  No  Did the patient bring in their glucometer from home?  No  How often do you monitor your CBG's? Patient states  checks glucose a few times per week in the mornings.   Financial Strains and Diabetes Management:  Are you having any financial strains with the device, your supplies or your medication? No .  Does the patient want to be seen by Chronic Care Management for management of their diabetes?  No  Would the patient like to be referred to a Nutritionist or for Diabetic Management?  No   Diabetic Exams:  Diabetic Eye Exam: Overdue for diabetic eye exam. Pt has been advised about the importance in completing this exam. Patient advised to call and schedule an eye exam. Diabetic Foot Exam: Completed 09/24/2019   Interpreter Needed?: No  Information entered by :: [place] of Daily Living In your present state of health, do you have any difficulty performing the following activities: 05/06/2020  Hearing? N  Vision? N  Difficulty concentrating or making decisions? N  Walking or climbing stairs? Y  Comment Has hip pain  Dressing or bathing? N  Doing errands, shopping? N  Preparing Food and eating ? N  Using the Toilet? N  In the past six months, have you accidently leaked urine? N  Do you have problems with loss of bowel control? N  Managing your Medications? N  Managing your Finances? N  Housekeeping or managing your Housekeeping? N  Some recent data might be hidden     Patient Care Team: [name], NP as PCP - General (Family Medicine) Iran Planas, MD as Consulting Physician (Orthopedic Surgery) Earnie Larsson, [facility] as Pharmacist (Pharmacist)  Indicate any recent Medical Services you may have received from other than [facility] providers in the past year (date may be approximate).     Assessment:   This is a routine wellness examination for Zaylin.  Hearing/Vision screen  Hearing Screening   '125Hz'$  $Remo'250Hz'hDuDG$'500Hz'$'1000Hz'$'2000Hz'$'3000Hz'$'4000Hz'$'6000Hz'$'8000Hz'$   Right ear:           Left ear:           Vision Screening Comments: Patient states has not had an eye examined in 6 years   Dietary issues and exercise activities discussed: Current Exercise Habits: The patient does not participate in regular exercise at present  Goals    . Exercise 3x per week (30 min per time)    . Pharmacy Care Plan     CARE PLAN ENTRY  Current Barriers:  . Chronic Disease Management support, education, and care coordination needs related to HTN, HLD, DMII, CKD, and tobacco use, IPF  Pharmacist Clinical Goal(s):  . Call provider office for new or worsened signs and symptoms  . Over the next month, begin/ continue self health monitoring activities as directed today  . Call care management team with questions or concerns . Diabetes:  Marland Kitchen Maintain A1c < 7.0% . Maintain update to date on diabetes preventative health.  . Blood pressure . Maintain blood pressure within goal of your provider (130/80 or 140/90)  . Maintain low salt diet . High cholesterol:  . Cholesterol goals: Total Cholesterol goal under 200, Triglycerides goal under 150, HDL goal above 40 (men) or above 50 (women), LDL goal under 70.  . Tobacco use:  Marland Kitchen Decrease tobacco use over the next couple of months.  . Heart burn: Minimize reflux symptoms and use of PPI's if not indicated. . Leg and back pain  . Continue to see improvement in pain level.   Interventions: . Comprehensive medication review  performed. . Diabetes . Discussed importance of diabetes preventative  health exams. . Obtain diabetic eye exam every 1 to 2 years.  . Obtain at least once yearly foot exams. . Continue: metformin 500mg , 1 tablet at bedtime  . Blood pressure:   . Discussed diet modifications. DASH diet:  following a diet emphasizing fruits and vegetables and low-fat dairy products along with whole grains, fish, poultry, and nuts. Reducing red meats and sugars.  . Smoking cessation   . Limiting the amount of alcohol to 1 or 2 drinks per day.   . Reducing the amount of salt intake to 1500mg /per day.  . Recommend using a salt substitute to replace your salt if you need flavor.    . Getting enough potassium in your diet equaling 3500-5000mg /day.  This helps to regulate BP by balancing out the effects of salt.   . Weight reduction- We discussed losing 5-10% of body weight . Continue: amlodipine 5mg , 1 tablet daily and lisinopril 40mg , 1 tablet daily . High cholesterol . How to reduce cholesterol through diet/weight management and physical activity.    . We discussed how a diet high in plant sterols (fruits/vegetables/nuts/whole grains/legumes) may reduce your cholesterol.  Encouraged increasing fiber to a daily intake of 10-25g/day  . Continue: atorvastatin 40mg , 1 tablet daily . Tobacco use . Aim on identifying quit date and obtain nicotine patches in preparation of quit date. Marland Kitchen GERD: . Discussed non-pharmacological interventions for acid reflux. Take measures to prevent acid reflux, such as avoiding spicy foods, avoiding caffeine, avoid laying down a few hours after eating, and raising the head of the bed. . Decrease omeprazole 20mg , 1 capsule every other day for 2 weeks and attempt to stop. Continue use only as needed if no symptoms return. . Chronic kidney disease .  Discussed: maintaining proper hydration. Avoiding NSAID's . Leg/ back pain . Consider trial of Tylenol Extra strength.  . Lifestyle  modifications . Exercise:  We discussed modifying their lifestyle, including initial attempts to lose 5 to 10% of body weight.   Engaging in at least 150 minutes per week of moderate-intensity exercise such as brisk walking (15- to 20-minute mile) or something similar. Recommended they incorporate flexibility, balance, and some type of strength training exercises.  Please begin slowly and build up gradually.  I discouraged prolonged sitting times.    . Diet:  We discussed following diets like the Mediterranean, or DASH diet.  Emphasizing seafood, lean meats, whole grains, legumes, nuts, fruits (for dessert), and vegetables. Limiting sugars, carbs, and red meat, refined and processed foods  Patient Self Care Activities:  . Self administers medications as prescribed and Calls provider office for new concerns or questions . Continue current medications as directed by providers.  . Continue following up with specialists. . Continue working on health habits (diet/ exercise).  Initial goal documentation     . Quit Smoking      Depression Screen PHQ 2/9 Scores 05/06/2020 09/24/2019 07/16/2018 01/14/2018 03/22/2014  PHQ - 2 Score 0 0 0 2 0  PHQ- 9 Score 0 - - 8 -    Fall Risk Fall Risk  05/06/2020 03/23/2020 07/16/2018  Falls in the past year? 0 0 0  Number falls in past yr: 0 0 -  Injury with Fall? 0 0 -  Risk for fall due to : - No Fall Risks -  Follow up - Falls evaluation completed -    Any stairs in or around the home? No  If so, are there any without handrails? No  Home free of loose  throw rugs in walkways, pet beds, electrical cords, etc? Yes  Adequate lighting in your home to reduce risk of falls? Yes   ASSISTIVE DEVICES UTILIZED TO PREVENT FALLS:  Life alert? No  Use of a cane, walker or w/c? No  Grab bars in the bathroom? No  Shower chair or bench in shower? No  Elevated toilet seat or a handicapped toilet? No    Cognitive Function:  Cognitive screening not indicated based on  direct observation.      Immunizations Immunization History  Administered Date(s) Administered  . Influenza, Seasonal, Injecte, Preservative Fre 03/10/2019  . Influenza,inj,Quad PF,6+ Mos 04/05/2015, 05/01/2016, 05/16/2018  . Influenza-Unspecified 04/22/2017  . PFIZER SARS-COV-2 Vaccination 09/03/2019, 09/30/2019  . Pneumococcal Conjugate-13 07/16/2018  . Pneumococcal Polysaccharide-23 05/30/2017  . Td 07/02/2005  . Zoster Recombinat (Shingrix) 06/19/2018, 11/10/2018    TDAP status: Due, Education has been provided regarding the importance of this vaccine. Advised may receive this vaccine at local pharmacy or Health Dept. Aware to provide a copy of the vaccination record if obtained from local pharmacy or Health Dept. Verbalized acceptance and understanding. Flu Vaccine status: Declined, Education has been provided regarding the importance of this vaccine but patient still declined. Advised may receive this vaccine at local pharmacy or Health Dept. Aware to provide a copy of the vaccination record if obtained from local pharmacy or Health Dept. Verbalized acceptance and understanding. Pneumococcal vaccine status: Up to date Covid-19 vaccine status: Completed vaccines  Qualifies for Shingles Vaccine? Yes   Zostavax completed No   Shingrix Completed?: Yes  Screening Tests Health Maintenance  Topic Date Due  . OPHTHALMOLOGY EXAM  04/22/2012  . INFLUENZA VACCINE  01/31/2020  . HEMOGLOBIN A1C  09/20/2020  . FOOT EXAM  09/23/2020  . COLONOSCOPY  07/09/2021  . PNA vac Low Risk Adult (2 of 2 - PPSV23) 05/30/2022  . COVID-19 Vaccine  Completed  . Hepatitis C Screening  Completed    Health Maintenance  Health Maintenance Due  Topic Date Due  . OPHTHALMOLOGY EXAM  04/22/2012  . INFLUENZA VACCINE  01/31/2020    Colorectal cancer screening: Completed 07/09/2018. Repeat every 3 years  Lung Cancer Screening: (Low Dose CT Chest recommended if Age 57-80 years, 30 pack-year currently  smoking OR have quit w/in 15years.) does qualify.   Lung Cancer Screening Referral: Yes  Additional Screening:  Hepatitis C Screening: does qualify; Completed 05/30/2017  Vision Screening: Recommended annual ophthalmology exams for early detection of glaucoma and other disorders of the eye. Is the patient up to date with their annual eye exam?  No  Who is the provider or what is the name of the office in which the patient attends annual eye exams? Patient is in the process of getting set up with a new eye doctor that takes his new insurance. He wishes to schedule himself with a provider  If pt is not established with a provider, would they like to be referred to a provider to establish care? No .   Dental Screening: Recommended annual dental exams for proper oral hygiene  Community Resource Referral / Chronic Care Management: CRR required this visit?  No   CCM required this visit?  No      Plan:     I have personally reviewed and noted the following in the patient's chart:   . Medical and social history . Use of alcohol, tobacco or illicit drugs  . Current medications and supplements . Functional ability and status . Nutritional status . Physical  activity . Advanced directives . List of other physicians . Hospitalizations, surgeries, and ER visits in previous 12 months . Vitals . Screenings to include cognitive, depression, and falls . Referrals and appointments  In addition, I have reviewed and discussed with patient certain preventive protocols, quality metrics, and best practice recommendations. A written personalized care plan for preventive services as well as general preventive health recommendations were provided to patient.     Ofilia Neas, LPN   21/03/4711   Nurse Notes: None

## 2020-05-06 NOTE — Patient Instructions (Addendum)
Mr. Richard [name] , Thank you for taking time to come for your Medicare Wellness Visit. I appreciate your ongoing commitment to your health goals. Please review the following plan we discussed and let me know if I can assist you in the future.   Screening recommendations/referrals: Colonoscopy: Up to date, next due 07/09/2021 Recommended yearly ophthalmology/optometry visit for glaucoma screening and checkup Recommended yearly dental visit for hygiene and checkup  Vaccinations: Influenza vaccine: Currently due, you may receive in our office or at your local pharmacy  Pneumococcal vaccine: Completed series  Tdap vaccine: Currently due, you may receive at our office or your local pharmacy. Shingles vaccine: Completed series    Advanced directives: Advance directive discussed with you today. Even though you declined this today please call our office should you change your mind and we can give you the proper paperwork for you to fill out.   Conditions/risks identified: Please consider smoking cessation and exercises at least 30 consistent minutes 3 days per week.  Next appointment: 06/23/2020 @ 8:00 am with Richard [name] 67 Years and Older, Male Preventive care refers to lifestyle choices and visits with your health care provider that can promote health and wellness. What does preventive care include?  A yearly physical exam. This is also called an annual well check.  Dental exams once or twice a year.  Routine eye exams. Ask your health care provider how often you should have your eyes checked.  Personal lifestyle choices, including:  Daily care of your teeth and gums.  Regular physical activity.  Eating a healthy diet.  Avoiding tobacco and drug use.  Limiting alcohol use.  Practicing safe sex.  Taking low doses of aspirin every day.  Taking vitamin and mineral supplements as recommended by your health care provider. What happens during an annual well  check? The services and screenings done by your health care provider during your annual well check will depend on your age, overall health, lifestyle risk factors, and family history of disease. Counseling  Your health care provider may ask you questions about your:  Alcohol use.  Tobacco use.  Drug use.  Emotional well-being.  Home and relationship well-being.  Sexual activity.  Eating habits.  History of falls.  Memory and ability to understand (cognition).  Work and work Statistician. Screening  You may have the following tests or measurements:  Height, weight, and BMI.  Blood pressure.  Lipid and cholesterol levels. These may be checked every 5 years, or more frequently if you are over 36 years old.  Skin check.  Lung cancer screening. You may have this screening every year starting at age 23 if you have a 30-pack-year history of smoking and currently smoke or have quit within the past 15 years.  Fecal occult blood test (FOBT) of the stool. You may have this test every year starting at age 55.  Flexible sigmoidoscopy or colonoscopy. You may have a sigmoidoscopy every 5 years or a colonoscopy every 10 years starting at age 72.  Prostate cancer screening. Recommendations will vary depending on your family history and other risks.  Hepatitis C blood test.  Hepatitis B blood test.  Sexually transmitted disease (STD) testing.  Diabetes screening. This is done by checking your blood sugar (glucose) after you have not eaten for a while (fasting). You may have this done every 1-3 years.  Abdominal aortic aneurysm (AAA) screening. You may need this if you are a current or former smoker.  Osteoporosis. You may be screened  starting at age 19 if you are at high risk. Talk with your health care provider about your test results, treatment options, and if necessary, the need for more tests. Vaccines  Your health care provider may recommend certain vaccines, such  as:  Influenza vaccine. This is recommended every year.  Tetanus, diphtheria, and acellular pertussis (Tdap, Td) vaccine. You may need a Td booster every 10 years.  Zoster vaccine. You may need this after age 5.  Pneumococcal 13-valent conjugate (PCV13) vaccine. One dose is recommended after age 37.  Pneumococcal polysaccharide (PPSV23) vaccine. One dose is recommended after age 66. Talk to your health care provider about which screenings and vaccines you need and how often you need them. This information is not intended to replace advice given to you by your health care provider. Make sure you discuss any questions you have with your health care provider. Document Released: 07/15/2015 Document Revised: 03/07/2016 Document Reviewed: 04/19/2015 Elsevier Interactive Patient Education  2017 [place] Prevention in the Home Falls can cause injuries. They can happen to people of all ages. There are many things you can do to make your home safe and to help prevent falls. What can I do on the outside of my home?  Regularly fix the edges of walkways and driveways and fix any cracks.  Remove anything that might make you trip as you walk through a door, such as a raised step or threshold.  Trim any bushes or trees on the path to your home.  Use bright outdoor lighting.  Clear any walking paths of anything that might make someone trip, such as rocks or tools.  Regularly check to see if handrails are loose or broken. Make sure that both sides of any steps have handrails.  Any raised decks and porches should have guardrails on the edges.  Have any leaves, snow, or ice cleared regularly.  Use sand or salt on walking paths during winter.  Clean up any spills in your garage right away. This includes oil or grease spills. What can I do in the bathroom?  Use night lights.  Install grab bars by the toilet and in the tub and shower. Do not use towel bars as grab bars.  Use  non-skid mats or decals in the tub or shower.  If you need to sit down in the shower, use a plastic, non-slip stool.  Keep the floor dry. Clean up any water that spills on the floor as soon as it happens.  Remove soap buildup in the tub or shower regularly.  Attach bath mats securely with double-sided non-slip rug tape.  Do not have throw rugs and other things on the floor that can make you trip. What can I do in the bedroom?  Use night lights.  Make sure that you have a light by your bed that is easy to reach.  Do not use any sheets or blankets that are too big for your bed. They should not hang down onto the floor.  Have a firm chair that has side arms. You can use this for support while you get dressed.  Do not have throw rugs and other things on the floor that can make you trip. What can I do in the kitchen?  Clean up any spills right away.  Avoid walking on wet floors.  Keep items that you use a lot in easy-to-reach places.  If you need to reach something above you, use a strong step stool that has a grab  bar.  Keep electrical cords out of the way.  Do not use floor polish or wax that makes floors slippery. If you must use wax, use non-skid floor wax.  Do not have throw rugs and other things on the floor that can make you trip. What can I do with my stairs?  Do not leave any items on the stairs.  Make sure that there are handrails on both sides of the stairs and use them. Fix handrails that are broken or loose. Make sure that handrails are as long as the stairways.  Check any carpeting to make sure that it is firmly attached to the stairs. Fix any carpet that is loose or worn.  Avoid having throw rugs at the top or bottom of the stairs. If you do have throw rugs, attach them to the floor with carpet tape.  Make sure that you have a light switch at the top of the stairs and the bottom of the stairs. If you do not have them, ask someone to add them for you. What  else can I do to help prevent falls?  Wear shoes that:  Do not have high heels.  Have rubber bottoms.  Are comfortable and fit you well.  Are closed at the toe. Do not wear sandals.  If you use a stepladder:  Make sure that it is fully opened. Do not climb a closed stepladder.  Make sure that both sides of the stepladder are locked into place.  Ask someone to hold it for you, if possible.  Clearly mark and make sure that you can see:  Any grab bars or handrails.  First and last steps.  Where the edge of each step is.  Use tools that help you move around (mobility aids) if they are needed. These include:  Canes.  Walkers.  Scooters.  Crutches.  Turn on the lights when you go into a dark area. Replace any light bulbs as soon as they burn out.  Set up your furniture so you have a clear path. Avoid moving your furniture around.  If any of your floors are uneven, fix them.  If there are any pets around you, be aware of where they are.  Review your medicines with your doctor. Some medicines can make you feel dizzy. This can increase your chance of falling. Ask your doctor what other things that you can do to help prevent falls. This information is not intended to replace advice given to you by your health care provider. Make sure you discuss any questions you have with your health care provider. Document Released: 04/14/2009 Document Revised: 11/24/2015 Document Reviewed: 07/23/2014 Elsevier Interactive Patient Education  2017 [employer].

## 2020-05-30 ENCOUNTER — Other Ambulatory Visit: Payer: Self-pay | Admitting: *Deleted

## 2020-05-30 DIAGNOSIS — F17210 Nicotine dependence, cigarettes, uncomplicated: Secondary | ICD-10-CM

## 2020-05-30 DIAGNOSIS — Z87891 Personal history of nicotine dependence: Secondary | ICD-10-CM

## 2020-06-23 ENCOUNTER — Ambulatory Visit (INDEPENDENT_AMBULATORY_CARE_PROVIDER_SITE_OTHER): Payer: Medicare (Managed Care) | Admitting: Adult Health

## 2020-06-23 ENCOUNTER — Other Ambulatory Visit: Payer: Self-pay

## 2020-06-23 ENCOUNTER — Encounter: Payer: Self-pay | Admitting: Adult Health

## 2020-06-23 VITALS — BP 128/72 | HR 75 | Temp 98.1°F | Ht 67.0 in | Wt 182.0 lb

## 2020-06-23 DIAGNOSIS — G250 Essential tremor: Secondary | ICD-10-CM | POA: Diagnosis not present

## 2020-06-23 DIAGNOSIS — Z23 Encounter for immunization: Secondary | ICD-10-CM | POA: Diagnosis not present

## 2020-06-23 DIAGNOSIS — J0140 Acute pansinusitis, unspecified: Secondary | ICD-10-CM | POA: Diagnosis not present

## 2020-06-23 DIAGNOSIS — E1129 Type 2 diabetes mellitus with other diabetic kidney complication: Secondary | ICD-10-CM | POA: Diagnosis not present

## 2020-06-23 LAB — POCT GLYCOSYLATED HEMOGLOBIN (HGB A1C): Hemoglobin A1C: 5.5 % (ref 4.0–5.6)

## 2020-06-23 MED ORDER — DOXYCYCLINE HYCLATE 100 MG PO CAPS: 100.0000 mg | ORAL_CAPSULE | Freq: Two times a day (BID) | ORAL | 0 refills | Status: DC

## 2020-06-23 MED ORDER — METFORMIN HCL 500 MG PO TABS: 250.0000 mg | ORAL_TABLET | Freq: Every day | ORAL | 1 refills | Status: DC

## 2020-06-23 MED ORDER — PROPRANOLOL HCL ER 60 MG PO CP24: 60.0000 mg | ORAL_CAPSULE | Freq: Every day | ORAL | 1 refills | Status: DC

## 2020-06-23 NOTE — Progress Notes (Signed)
Subjective:    Patient ID: Richard [name], male    DOB: [birth year], 69 y.o.   MRN: [id number]  HPI  69 year old male who  has a past medical history of ANEMIA-NOS (12/06/2008), BACK PAIN (02/20/2010), DIABETES MELLITUS, TYPE II (06/01/2008), GERD (06/01/2008), HYPERLIPIDEMIA (06/01/2008), HYPERTENSION (06/01/2008), and Tremors of nervous system.   He presents to the office today for 74-monthfollow-up regarding diabetes mellitus and essential tremor.  In the past he has been well controlled on Metformin 500 mg daily.  During his last visit his A1c had decreased to 5.5, we subsequently decreased his Metformin from 500 mg daily to 250 mg daily.  Lab Results  Component Value Date   HGBA1C 5.5 03/23/2020   Essential Tremor - was placed on Gabapentin 300 mg at bedtime. He did not find any benefit on this. His brother is on Propanolol and he would like to try this medication again   Additionally, he reports that over the last 10 days he has been experiencing sinus pain/pressure, nasal congestion, headache, and runny nose. He has been using various OTC medications without relief    Review of Systems See HPI   Past Medical History:  Diagnosis Date  . ANEMIA-NOS 12/06/2008  . BACK PAIN 02/20/2010  . DIABETES MELLITUS, TYPE II 06/01/2008  . GERD 06/01/2008  . HYPERLIPIDEMIA 06/01/2008  . HYPERTENSION 06/01/2008  . Tremors of nervous system    pt states he has had tremors most of his life in his hands/hereditary    Social History   Socioeconomic History  . Marital status: Married    Spouse name: Not on file  . Number of children: Not on file  . Years of education: Not on file  . Highest education level: Not on file  Occupational History  . Not on file  Tobacco Use  . Smoking status: Current Every Day Smoker    Packs/day: 2.00    Years: 35.00    Pack years: 70.00    Types: Cigarettes    Last attempt to quit: 06/08/2018    Years since quitting: 2.0  . Smokeless tobacco: Never Used  .  Tobacco comment: down to 1.5 ppd X8 mos.  plans to quit on upcoming birthday  Vaping Use  . Vaping Use: Never used  Substance and Sexual Activity  . Alcohol use: Yes    Alcohol/week: 2.0 - 3.0 standard drinks    Types: 2 - 3 Standard drinks or equivalent per week  . Drug use: No  . Sexual activity: Not on file  Other Topics Concern  . Not on file  Social History Narrative  . Not on file   Social Determinants of Health   Financial Resource Strain: Low Risk   . Difficulty of Paying Living Expenses: Not hard at all  Food Insecurity: No Food Insecurity  . Worried About RCharity fundraiserin the Last Year: Never true  . Ran Out of Food in the Last Year: Never true  Transportation Needs: No Transportation Needs  . Lack of Transportation (Medical): No  . Lack of Transportation (Non-Medical): No  Physical Activity: Inactive  . Days of Exercise per Week: 0 days  . Minutes of Exercise per Session: 0 min  Stress: No Stress Concern Present  . Feeling of Stress : Only a little  Social Connections: Moderately Isolated  . Frequency of Communication with Friends and Family: More than three times a week  . Frequency of Social Gatherings with Friends and Family: More than  three times a week  . Attends Religious Services: Never  . Active Member of Clubs or Organizations: No  . Attends Archivist Meetings: Never  . Marital Status: Married  Human resources officer Violence: Not At Risk  . Fear of Current or Ex-Partner: No  . Emotionally Abused: No  . Physically Abused: No  . Sexually Abused: No    Past Surgical History:  Procedure Laterality Date  . COLONOSCOPY    . ELBOW BURSA SURGERY     right elbow  . ELBOW BURSA SURGERY Left 11/12/2018   Pt had left olecranon bursectomy  . HAND SURGERY     2 surgeries on right hand  . HAND SURGERY     left hand/removed a bone    Family History  Problem Relation Age of Onset  . Diabetes Sister   . Alzheimer's disease Sister   . Diabetes  Brother   . Heart disease Mother   . Sudden death Father        Suicide after his wife passed away   . Colon cancer Neg Hx     No Known Allergies  Current Outpatient Medications on File Prior to Visit  Medication Sig Dispense Refill  . amLODipine (NORVASC) 5 MG tablet TAKE 1 TABLET(5 MG) BY MOUTH DAILY 90 tablet 3  . Ascorbic Acid (VITAMIN C) 1000 MG tablet Take 1,000 mg by mouth daily.    Marland Kitchen aspirin EC 81 MG tablet Take 81 mg by mouth daily.    Marland Kitchen atorvastatin (LIPITOR) 40 MG tablet TAKE 1 TABLET(40 MG) BY MOUTH DAILY 90 tablet 3  . cholecalciferol (VITAMIN D3) 25 MCG (1000 UNIT) tablet Take 1,000 Units by mouth daily.    Marland Kitchen gabapentin (NEURONTIN) 300 MG capsule Take 1 capsule (300 mg total) by mouth at bedtime. 90 capsule 0  . glucose blood (FREESTYLE TEST STRIPS) test strip Use as instructed 100 each 12  . glucose monitoring kit (FREESTYLE) monitoring kit USE TO TEST BLOOD GLUCOSE DAILY. 1 each 0  . ibuprofen (ADVIL) 200 MG tablet Take 200 mg by mouth 3 (three) times daily.    . Lancets (FREESTYLE) lancets Use as instructed 100 each 12  . lisinopril (ZESTRIL) 40 MG tablet TAKE 1 TABLET(40 MG) BY MOUTH DAILY 90 tablet 3  . metFORMIN (GLUCOPHAGE) 500 MG tablet Take 0.5 tablets (250 mg total) by mouth at bedtime. 45 tablet 0  . omeprazole (PRILOSEC) 20 MG capsule TAKE 1 CAPSULE(20 MG) BY MOUTH DAILY 90 capsule 3   No current facility-administered medications on file prior to visit.    There were no vitals taken for this visit.      Objective:   Physical Exam Vitals and nursing note reviewed.  Constitutional:      Appearance: Normal appearance.  HENT:     Right Ear: Tympanic membrane, ear canal and external ear normal. There is no impacted cerumen.     Left Ear: Tympanic membrane, ear canal and external ear normal. There is no impacted cerumen.     Nose: Congestion and rhinorrhea present.     Mouth/Throat:     Mouth: Mucous membranes are moist.     Pharynx: Oropharynx is clear.  No oropharyngeal exudate.  Eyes:     Extraocular Movements: Extraocular movements intact.     Conjunctiva/sclera: Conjunctivae normal.     Pupils: Pupils are equal, round, and reactive to light.  Pulmonary:     Effort: Pulmonary effort is normal.     Breath sounds: Normal breath sounds.  Skin:    General: Skin is warm and dry.     Capillary Refill: Capillary refill takes less than 2 seconds.  Neurological:     Mental Status: He is alert and oriented to person, place, and time.  Psychiatric:        Mood and Affect: Mood normal.        Behavior: Behavior normal.        Thought Content: Thought content normal.        Judgment: Judgment normal.       Assessment & Plan:  1. Type 2 diabetes mellitus with other diabetic kidney complication, without long-term current use of insulin (HCC)  - metFORMIN (GLUCOPHAGE) 500 MG tablet; Take 0.5 tablets (250 mg total) by mouth at bedtime.  Dispense: 45 tablet; Refill: 1 - POCT HgB A1C- 5.5  - keep on Metformin 250 mg daily - Follow up in 6 months   2. Essential tremor - Will d/c gabapentin and start on Inderal - Follow up in 90 days or sooner if needed - propranolol ER (INDERAL LA) 60 MG 24 hr capsule; Take 1 capsule (60 mg total) by mouth at bedtime.  Dispense: 90 capsule; Refill: 1  3. Acute non-recurrent pansinusitis  - doxycycline (VIBRAMYCIN) 100 MG capsule; Take 1 capsule (100 mg total) by mouth 2 (two) times daily.  Dispense: 14 capsule; Refill: 0

## 2020-06-23 NOTE — Patient Instructions (Signed)
Your A1c was 5.5. - lets keep you on Metformin 250 mg daily   I am also going to switch you from Gabapentin to Inderal LA   I am also going to send in Doxycycline for your sinus infection   Follow up after 09/24/2020

## 2020-06-23 NOTE — Addendum Note (Signed)
Addended by: Matilde Sprang on: 06/23/2020 09:19 AM   Modules accepted: Orders

## 2020-07-14 ENCOUNTER — Other Ambulatory Visit: Payer: Self-pay | Admitting: Adult Health

## 2020-07-14 DIAGNOSIS — I10 Essential (primary) hypertension: Secondary | ICD-10-CM

## 2020-07-14 NOTE — Telephone Encounter (Signed)
SENT TO THE PHARMACY BY E-SCRIBE.  PT HAS AN UPCOMING CPX. 

## 2020-08-19 ENCOUNTER — Other Ambulatory Visit: Payer: Self-pay | Admitting: Adult Health

## 2020-08-19 DIAGNOSIS — I10 Essential (primary) hypertension: Secondary | ICD-10-CM

## 2020-08-23 ENCOUNTER — Encounter: Payer: Self-pay | Admitting: Pharmacist

## 2020-08-23 NOTE — Chronic Care Management (AMB) (Signed)
Error

## 2020-09-07 ENCOUNTER — Telehealth: Payer: Self-pay | Admitting: Pharmacist

## 2020-09-07 NOTE — Chronic Care Management (AMB) (Signed)
I left the patient a message about his upcoming appointment on 09/08/2020 @ 10:00 am with the clinical pharmacist. He was asked to please have all medication on hand to review with the pharmacist.   Neita Goodnight) Mare Ferrari, [place] Assistant [phone]

## 2020-09-08 ENCOUNTER — Ambulatory Visit (INDEPENDENT_AMBULATORY_CARE_PROVIDER_SITE_OTHER): Payer: Medicare (Managed Care) | Admitting: Pharmacist

## 2020-09-08 DIAGNOSIS — E1129 Type 2 diabetes mellitus with other diabetic kidney complication: Secondary | ICD-10-CM

## 2020-09-08 DIAGNOSIS — I10 Essential (primary) hypertension: Secondary | ICD-10-CM

## 2020-09-08 DIAGNOSIS — E785 Hyperlipidemia, unspecified: Secondary | ICD-10-CM

## 2020-09-08 NOTE — Progress Notes (Signed)
Chronic Care Management Pharmacy Note  09/08/2020 Name:  Richard [name] MRN:  [id number] DOB:  1951-04-25  Subjective: Richard [name] is an 70 y.o. year old male who is a primary patient of Nafziger, Tommi Rumps, NP.  The CCM team was consulted for assistance with disease management and care coordination needs.    Engaged with patient by telephone for follow up visit in response to provider referral for pharmacy case management and/or care coordination services.   Consent to Services:  The patient was given information about Chronic Care Management services, agreed to services, and gave verbal consent prior to initiation of services.  Please see initial visit note for detailed documentation.   Patient Care Team: [name], NP as PCP - General (Family Medicine) Iran Planas, MD as Consulting Physician (Orthopedic Surgery) Earnie Larsson, [facility] as Pharmacist (Pharmacist)  Recent office visits: 06/23/20 [name], NP: Patient presented for chronic conditions follow up. D/c'd gabapentin. Prescribed propranolol 60 mg daily for tremor. Prescribed doxycyline for sinusitis.  05/06/20 Ofilia Neas, LPN: Patient presented for medicare annual wellness visit. Placed referral for lung cancer screening.   03/23/20 [name], NP: Patient presented for diabetes follow up. Prescribed gabapentin for tremor. Decreased metformin to 250 mg daily based on A1c of 5.5%.   Recent consult visits: 06/01/20 Latanya Maudlin ([facility]): Patient presented for heel pain follow up. Unable to access notes.  05/04/2019- Pulmonology- Patient presented to Dr. Kara Mead, MD for IPF. Patient is a heavy smoker and has OSA, emphysema and ILD. Discussed smoking cessation. Patient has used Chantix in the past; does not want to use nicotine replacement. Patient stopped using CPAP, patient was advised weight gain and to avoid sleeping in supine position.   Hospital visits: None in previous 6  months  Objective:  Lab Results  Component Value Date   CREATININE 1.39 09/25/2019   BUN 17 09/25/2019   GFR 51.08 (L) 09/25/2019   GFRNONAA 69.80 11/30/2009   NA 134 (L) 09/25/2019   K 5.1 09/25/2019   CALCIUM 9.6 09/25/2019   CO2 26 09/25/2019    Lab Results  Component Value Date/Time   HGBA1C 5.5 06/23/2020 08:23 AM   HGBA1C 5.5 03/23/2020 07:36 AM   HGBA1C 5.7 09/25/2019 08:33 AM   HGBA1C 5.2 01/13/2019 08:38 AM   HGBA1C 5.3 05/16/2018 07:53 AM   GFR 51.08 (L) 09/25/2019 08:33 AM   GFR 56.36 (L) 07/16/2018 07:59 AM   MICROALBUR 5.3 (H) 05/30/2017 09:10 AM   MICROALBUR 5.7 (H) 04/27/2016 09:00 AM    Last diabetic Eye exam:  Lab Results  Component Value Date/Time   HMDIABEYEEXA no diab. retinopathy 04/23/2011 12:00 AM    Last diabetic Foot exam:  Lab Results  Component Value Date/Time   HMDIABFOOTEX yes 12/12/2009 12:00 AM     Lab Results  Component Value Date   CHOL 127 09/25/2019   HDL 60.90 09/25/2019   LDLCALC 49 09/25/2019   TRIG 83.0 09/25/2019   CHOLHDL 2 09/25/2019    Hepatic Function Latest Ref Rng & Units 09/25/2019 07/16/2018 05/30/2017  Total Protein 6.0 - 8.3 g/dL 7.2 7.2 6.8  Albumin 3.5 - 5.2 g/dL 4.5 4.4 4.0  AST 0 - 37 U/L $Remo'24 16 29  'TmgQY$ ALT 0 - 53 U/L $Remo'18 13 27  'lNiaJ$ Alk Phosphatase 39 - 117 U/L 79 74 86  Total Bilirubin 0.2 - 1.2 mg/dL 1.0 0.8 0.5  Bilirubin, Direct 0.0 - 0.3 mg/dL - - -    Lab Results  Component Value Date/Time  TSH 1.04 09/25/2019 08:33 AM   TSH 1.84 07/16/2018 07:59 AM    CBC Latest Ref Rng & Units 09/25/2019 07/16/2018 05/30/2017  WBC 4.0 - 10.5 K/uL 6.9 7.3 4.5  Hemoglobin 13.0 - 17.0 g/dL 15.5 14.9 15.8  Hematocrit 39.0 - 52.0 % 43.6 42.7 46.1  Platelets 150.0 - 400.0 K/uL 210.0 226.0 175.0    No results found for: VD25OH  Clinical ASCVD: No  The ASCVD Risk score Mikey Bussing DC Jr., et al., 2013) failed to calculate for the following reasons:   The valid total cholesterol range is 130 to 320 mg/dL    Depression  screen [facility] 2/9 05/06/2020 09/24/2019 07/16/2018  Decreased Interest 0 0 0  Down, Depressed, Hopeless 0 0 0  PHQ - 2 Score 0 0 0  Altered sleeping 0 - -  Tired, decreased energy 0 - -  Change in appetite 0 - -  Feeling bad or failure about yourself  0 - -  Trouble concentrating 0 - -  Moving slowly or fidgety/restless 0 - -  Suicidal thoughts 0 - -  PHQ-9 Score 0 - -  Difficult doing work/chores Not difficult at all - -      Social History   Tobacco Use  Smoking Status Current Every Day Smoker   Packs/day: 2.00   Years: 35.00   Pack years: 70.00   Types: Cigarettes   Last attempt to quit: 06/08/2018   Years since quitting: 2.2  Smokeless Tobacco Never Used  Tobacco Comment   down to 1.5 ppd X8 mos.  plans to quit on upcoming birthday   BP Readings from Last 3 Encounters:  06/23/20 128/72  03/23/20 134/78  09/24/19 128/80   Pulse Readings from Last 3 Encounters:  06/23/20 75  03/23/20 84  05/04/19 84   Wt Readings from Last 3 Encounters:  06/23/20 182 lb (82.6 kg)  03/23/20 184 lb 3.2 oz (83.6 kg)  09/24/19 198 lb (89.8 kg)    Assessment/Interventions: Review of patient past medical history, allergies, medications, health status, including review of consultants reports, laboratory and other test data, was performed as part of comprehensive evaluation and provision of chronic care management services.   SDOH:  (Social Determinants of Health) assessments and interventions performed: No   CCM Care Plan  No Known Allergies  Medications Reviewed Today    Reviewed by Karren Cobble, [place] (Certified Medical Assistant) on 06/23/20 at [phone]  Med List Status: <None>  Medication Order Taking? Sig Documenting Provider Last Dose Status Informant  amLODipine (NORVASC) 5 MG tablet [id number]  TAKE 1 TABLET(5 MG) BY MOUTH DAILY Nafziger, Tommi Rumps, NP  Active   Ascorbic Acid (VITAMIN C) 1000 MG tablet [id number]  Take 1,000 mg by mouth daily. [provider]  Active  Self  aspirin EC 81 MG tablet [id number]  Take 81 mg by mouth daily. [provider]  Active Self  atorvastatin (LIPITOR) 40 MG tablet [id number]  TAKE 1 TABLET(40 MG) BY MOUTH DAILY Nafziger, Tommi Rumps, NP  Active   cholecalciferol (VITAMIN D3) 25 MCG (1000 UNIT) tablet [id number]  Take 1,000 Units by mouth daily. [provider]  Active Self  gabapentin (NEURONTIN) 300 MG capsule [id number]  Take 1 capsule (300 mg total) by mouth at bedtime. Nafziger, Tommi Rumps, NP  Active   glucose blood (FREESTYLE TEST STRIPS) test strip [id number]  Use as instructed Nafziger, Tommi Rumps, NP  Active   glucose monitoring kit (FREESTYLE) monitoring kit [id number]  USE TO TEST BLOOD GLUCOSE DAILY. [name], NP  Active  ibuprofen (ADVIL) 200 MG tablet [id number]  Take 200 mg by mouth 3 (three) times daily. [provider]  Active Self  Lancets (FREESTYLE) lancets [id number]  Use as instructed Nafziger, Tommi Rumps, NP  Active   lisinopril (ZESTRIL) 40 MG tablet [id number]  TAKE 1 TABLET(40 MG) BY MOUTH DAILY Nafziger, Tommi Rumps, NP  Active   metFORMIN (GLUCOPHAGE) 500 MG tablet [id number]  Take 0.5 tablets (250 mg total) by mouth at bedtime. Nafziger, Tommi Rumps, NP  Active   omeprazole (PRILOSEC) 20 MG capsule [id number]  TAKE 1 CAPSULE(20 MG) BY MOUTH DAILY [name], NP  Active           Patient Active Problem List   Diagnosis Date Noted   IPF (idiopathic pulmonary fibrosis) ([place]) 07/23/2018   Pulmonary nodule 07/23/2018   OSA (obstructive sleep apnea) 06/18/2018   Alcohol abuse 10/31/2016   Chronic renal insufficiency, stage II (mild) 03/22/2014   Benign familial tremor 03/22/2014   Macrocytosis without anemia 12/31/2011   Tobacco use 12/31/2011   BACK PAIN 02/20/2010   Diabetes mellitus with renal complications ([place]) 43/73/5789   Dyslipidemia 06/01/2008   Essential hypertension 06/01/2008   GERD 06/01/2008    Immunization History  Administered Date(s) Administered   Fluad Quad(high  Dose 65+) 06/23/2020   Influenza, Seasonal, Injecte, Preservative Fre 03/10/2019   Influenza,inj,Quad PF,6+ Mos 04/05/2015, 05/01/2016, 05/16/2018   Influenza-Unspecified 04/22/2017   PFIZER(Purple Top)SARS-COV-2 Vaccination 09/03/2019, 09/30/2019, 06/08/2020   Pneumococcal Conjugate-13 07/16/2018   Pneumococcal Polysaccharide-23 05/30/2017   Td 07/02/2005   Zoster Recombinat (Shingrix) 06/19/2018, 11/10/2018    Conditions to be addressed/monitored:  Hypertension, Hyperlipidemia, Diabetes, GERD, Chronic Kidney Disease, Tobacco use and IPF  There are no care plans that you recently modified to display for this patient.   Medication Assistance: None required.  Patient affirms current coverage meets needs.  Patient's preferred pharmacy is:  [id number] [address], [address] [place] [address] Phone: [phone] Fax: [phone]  [place], [address], [address] [place], [address] [place] [address] Phone: [phone] Fax: [phone]  Uses pill box? Yes Pt endorses 100% compliance  We discussed: Current pharmacy is preferred with insurance plan and patient is satisfied with pharmacy services Patient decided to: Continue current medication management strategy  Care Plan and Follow Up Patient Decision:  Patient agrees to Care Plan and Follow-up.  Plan: Telephone follow up appointment with care management team member scheduled for:  6 months  Jeni Salles, PharmD [place] Pharmacist [place] [facility] [place] [phone]

## 2020-09-15 NOTE — Patient Instructions (Signed)
Visit Information  Goals Addressed   None    Patient Care Plan: CCM Pharmacy Care Plan    Problem Identified: Problem: Hypertension, Hyperlipidemia, Diabetes, GERD, Chronic Kidney Disease, Tobacco use and IPF     Long-Range Goal: Patient-Specific Goal   Start Date: 09/08/2020  Expected End Date: 09/08/2020  This Visit's Progress: On track  Priority: High  Note:   Current Barriers:  . Unable to independently monitor therapeutic efficacy  Pharmacist Clinical Goal(s):  Marland Kitchen Patient will achieve adherence to monitoring guidelines and medication adherence to achieve therapeutic efficacy through collaboration with PharmD and provider.   Interventions: . 1:1 collaboration with [name], NP regarding development and update of comprehensive plan of care as evidenced by provider attestation and co-signature . Inter-disciplinary care team collaboration (see longitudinal plan of care) . Comprehensive medication review performed; medication list updated in electronic medical record  Hypertension (BP goal <140/90) -Controlled -Current treatment:  amlodipine 5mg , 1 tablet daily  lisinopril 40mg , 1 tablet daily -Medications previously tried: none  -Current home readings: unable to provide -Current dietary habits: patient is including more vegetables and minimizing salt intake -Current exercise habits: did not discuss -Denies hypotensive/hypertensive symptoms -Educated on Exercise goal of 150 minutes per week; Importance of home blood pressure monitoring; Proper BP monitoring technique; -Counseled to monitor BP at home weekly, document, and provide log at future appointments -Counseled on diet and exercise extensively Recommended to continue current medication  Hyperlipidemia: (LDL goal < 70) -Controlled -Current treatment: . atorvastatin 40mg , 1 tablet daily  -Medications previously tried: simvastatin  -Current dietary patterns: using the air fryer and using olive oil instead of  vegetable oil -Current exercise habits: did not discuss -Educated on Cholesterol goals;  Importance of limiting foods high in cholesterol; Exercise goal of 150 minutes per week; -Counseled on diet and exercise extensively Recommended to continue current medication  Diabetes (A1c goal <7%) -Controlled -Current medications: Marland Kitchen Metformin 500 mg 1/2 tablet daily -Medications previously tried: none  -Current home glucose readings . fasting glucose: does not check . post prandial glucose: does not check -Denies hypoglycemic/hyperglycemic symptoms -Current exercise: stays on the move constantly but no structured exercise -Educated on Exercise goal of 150 minutes per week; Carbohydrate counting and/or plate method -Counseled to check feet daily and get yearly eye exams -Counseled on diet and exercise extensively Recommended to continue current medication  Tobacco use (Goal quit smoking) -Uncontrolled -Previous quit attempts: bupropion (Wellbutrin SR), Chantix; patient states he has quit multiple times in the past, but eventually goes back to smoking. Longest he has ever quit was for 15 years -Current treatment  . No medications -Patient smokes Within 30 minutes of waking -Patient triggers include:  habit -On a scale of 1-10, reports MOTIVATION to quit is 1 -On a scale of 1-10, reports CONFIDENCE in quitting is 1 - Educated on the availability of the quit line to provide free resources -Educated on benefits of quitting smoking   GERD (Goal: minimize symptoms) -Controlled -Current treatment  . omeprazole 20mg , 1 capsule every other day -Medications previously tried: none  -Counseled on long term risks of taking PPIs and patient was happy with taking it every other day   Leg/back pain (Goal: minimize pain) -Controlled -Current treatment  . Ibuprofen 200mg , 1 tablet three times daily  -Medications previously tried: none  -Counseled on risks of taking NSAIDs due to kidney function  and patient reports this is the only thing that helps  Tremor (Goal: minimize symptoms) -Uncontrolled -Current treatment  .  Propranolol 60 mg 1 tablet daily -Medications previously tried: none  -Counseled on the importance of checking blood pressure as this can also affect it   Health Maintenance -Vaccine gaps: tetanus -Current therapy:  . Aspirin 1 tablet daily . Vitamin D 1000 units 1 tablet daily . Vitamin B12 1000 mcg 1 tablet . Vitamin C 1000 mg 1 tablet -Educated on Cost vs benefit of each product must be carefully weighed by individual consumer -Patient is satisfied with current therapy and denies issues -Recommended to continue current medication  Patient Goals/Self-Care Activities . Patient will:  - take medications as prescribed check blood pressure weekly, document, and provide at future appointments  Follow Up Plan: Telephone follow up appointment with care management team member scheduled for: 6 months       The patient verbalized understanding of instructions, educational materials, and care plan provided today and declined offer to receive copy of patient instructions, educational materials, and care plan.  Telephone follow up appointment with pharmacy team member scheduled for: 6 months  Viona Gilmore, [facility]

## 2020-09-27 ENCOUNTER — Encounter: Payer: Self-pay | Admitting: Adult Health

## 2020-09-27 ENCOUNTER — Other Ambulatory Visit: Payer: Self-pay

## 2020-09-27 ENCOUNTER — Ambulatory Visit (INDEPENDENT_AMBULATORY_CARE_PROVIDER_SITE_OTHER): Payer: Medicare (Managed Care) | Admitting: Adult Health

## 2020-09-27 VITALS — BP 128/80 | Temp 97.6°F | Ht 67.0 in | Wt 190.1 lb

## 2020-09-27 DIAGNOSIS — I10 Essential (primary) hypertension: Secondary | ICD-10-CM | POA: Diagnosis not present

## 2020-09-27 DIAGNOSIS — Z125 Encounter for screening for malignant neoplasm of prostate: Secondary | ICD-10-CM | POA: Diagnosis not present

## 2020-09-27 DIAGNOSIS — Z720 Tobacco use: Secondary | ICD-10-CM

## 2020-09-27 DIAGNOSIS — G4733 Obstructive sleep apnea (adult) (pediatric): Secondary | ICD-10-CM

## 2020-09-27 DIAGNOSIS — G250 Essential tremor: Secondary | ICD-10-CM | POA: Diagnosis not present

## 2020-09-27 DIAGNOSIS — E1129 Type 2 diabetes mellitus with other diabetic kidney complication: Secondary | ICD-10-CM

## 2020-09-27 DIAGNOSIS — J84112 Idiopathic pulmonary fibrosis: Secondary | ICD-10-CM

## 2020-09-27 DIAGNOSIS — E785 Hyperlipidemia, unspecified: Secondary | ICD-10-CM

## 2020-09-27 DIAGNOSIS — K2100 Gastro-esophageal reflux disease with esophagitis, without bleeding: Secondary | ICD-10-CM

## 2020-09-27 LAB — LIPID PANEL
Cholesterol: 124 mg/dL (ref 0–200)
HDL: 49.4 mg/dL (ref >39.00)
LDL Cholesterol: 60 mg/dL (ref 0–99)
NonHDL: 74.83
Total CHOL/HDL Ratio: 3
Triglycerides: 72 mg/dL (ref 0.0–149.0)
VLDL: 14.4 mg/dL (ref 0.0–40.0)

## 2020-09-27 LAB — CBC WITH DIFFERENTIAL/PLATELET
Basophils Absolute: 0 10*3/uL (ref 0.0–0.1)
Basophils Relative: 0.9 % (ref 0.0–3.0)
Eosinophils Absolute: 0.2 10*3/uL (ref 0.0–0.7)
Eosinophils Relative: 4.1 % (ref 0.0–5.0)
HCT: 45.5 % (ref 39.0–52.0)
Hemoglobin: 15.9 g/dL (ref 13.0–17.0)
Lymphocytes Relative: 31.5 % (ref 12.0–46.0)
Lymphs Abs: 1.6 10*3/uL (ref 0.7–4.0)
MCHC: 35 g/dL (ref 30.0–36.0)
MCV: 110.8 fl — ABNORMAL HIGH (ref 78.0–100.0)
Monocytes Absolute: 0.5 10*3/uL (ref 0.1–1.0)
Monocytes Relative: 9.1 % (ref 3.0–12.0)
Neutro Abs: 2.7 10*3/uL (ref 1.4–7.7)
Neutrophils Relative %: 54.4 % (ref 43.0–77.0)
Platelets: 194 10*3/uL (ref 150.0–400.0)
RBC: 4.1 Mil/uL — ABNORMAL LOW (ref 4.22–5.81)
RDW: 13 % (ref 11.5–15.5)
WBC: 5 10*3/uL (ref 4.0–10.5)

## 2020-09-27 LAB — COMPREHENSIVE METABOLIC PANEL
ALT: 15 U/L (ref 0–53)
AST: 17 U/L (ref 0–37)
Albumin: 4.2 g/dL (ref 3.5–5.2)
Alkaline Phosphatase: 82 U/L (ref 39–117)
BUN: 15 mg/dL (ref 6–23)
CO2: 28 mEq/L (ref 19–32)
Calcium: 9.6 mg/dL (ref 8.4–10.5)
Chloride: 103 mEq/L (ref 96–112)
Creatinine, Ser: 1.36 mg/dL (ref 0.40–1.50)
GFR: 53.93 mL/min — ABNORMAL LOW (ref >60.00)
Glucose, Bld: 106 mg/dL — ABNORMAL HIGH (ref 70–99)
Potassium: 4.5 mEq/L (ref 3.5–5.1)
Sodium: 138 mEq/L (ref 135–145)
Total Bilirubin: 0.7 mg/dL (ref 0.2–1.2)
Total Protein: 7 g/dL (ref 6.0–8.3)

## 2020-09-27 LAB — VITAMIN B12: Vitamin B-12: 1128 pg/mL — ABNORMAL HIGH (ref 211–911)

## 2020-09-27 LAB — VITAMIN D 25 HYDROXY (VIT D DEFICIENCY, FRACTURES): VITD: 61.84 ng/mL (ref 30.00–100.00)

## 2020-09-27 LAB — PSA: PSA: 1.33 ng/mL (ref 0.10–4.00)

## 2020-09-27 LAB — TSH: TSH: 1.08 u[IU]/mL (ref 0.35–4.50)

## 2020-09-27 LAB — HEMOGLOBIN A1C: Hgb A1c MFr Bld: 5.1 % (ref 4.6–6.5)

## 2020-09-27 MED ORDER — AMLODIPINE BESYLATE 5 MG PO TABS
ORAL_TABLET | ORAL | 3 refills | Status: DC
Start: 2020-09-27 — End: 2021-09-29

## 2020-09-27 MED ORDER — PROPRANOLOL HCL ER 120 MG PO CP24
120.0000 mg | ORAL_CAPSULE | Freq: Every day | ORAL | 1 refills | Status: DC
Start: 2020-09-27 — End: 2021-03-08

## 2020-09-27 MED ORDER — ATORVASTATIN CALCIUM 40 MG PO TABS
ORAL_TABLET | ORAL | 3 refills | Status: DC
Start: 2020-09-27 — End: 2020-10-07

## 2020-09-27 MED ORDER — OMEPRAZOLE 20 MG PO CPDR: DELAYED_RELEASE_CAPSULE | ORAL | 3 refills | Status: DC

## 2020-09-27 MED ORDER — LISINOPRIL 40 MG PO TABS: 40.0000 mg | ORAL_TABLET | Freq: Every day | ORAL | 3 refills | Status: DC

## 2020-09-27 NOTE — Patient Instructions (Signed)
It was great seeing you today   We will follow up with you regarding your blood work   Please try and call pulmonary again   I have increased inderal to 120 mg to see if this helps with the essential tremor - let me know either way

## 2020-09-27 NOTE — Addendum Note (Signed)
Addended by: Tessie Fass D on: 09/27/2020 08:23 AM   Modules accepted: Orders

## 2020-09-27 NOTE — Progress Notes (Signed)
 Subjective:    Patient ID: Richard [name], male    DOB: 04/14/1951, 70 y.o.   MRN: [id number]  HPI Patient presents for yearly preventative medicine examination. He is a pleasant 70 year old male who  has a past medical history of ANEMIA-NOS (12/06/2008), BACK PAIN (02/20/2010), DIABETES MELLITUS, TYPE II (06/01/2008), GERD (06/01/2008), HYPERLIPIDEMIA (06/01/2008), HYPERTENSION (06/01/2008), and Tremors of nervous system.  DM -well controlled with Metformin 250 mg daily and lifestyle modifications.   Lab Results  Component Value Date   HGBA1C 5.5 06/23/2020   Hyperlipidemia -currently prescribed Lipitor 40 mg daily.  He has failed simvastatin in the past.  Denies myalgia or fatigue Lab Results  Component Value Date   CHOL 127 09/25/2019   HDL 60.90 09/25/2019   LDLCALC 49 09/25/2019   TRIG 83.0 09/25/2019   CHOLHDL 2 09/25/2019   Hypertension -controlled with lisinopril 40 mg daily and Norvasc 5 mg daily.  He denies dizziness, lightheadedness, chest pain, or shortness of breath BP Readings from Last 3 Encounters:  09/27/20 128/80  06/23/20 128/72  03/23/20 134/78    Tobacco Use -has tried quitting multiple times in the past but eventually goes back to smoking.  He has tried Wellbutrin and Chantix and felt as though this did nothing to help him quit.  He continues to smoke  IPF - is followed by pulmonary. Last CT chest was in 08/2019 which showed:  1. Unusual pattern of pulmonary fibrosis which appears very similar to the prior examination, as detailed above. Findings remain indeterminate for usual interstitial pneumonia (UIP) per current ats guidelines. Given the unusual distribution and stability of findings, this is once again favored to reflect fibrotic phase nonspecific interstitial pneumonia (NSIP). 2. Aortic atherosclerosis, in addition to left main and 2 vessel coronary artery disease. Please note that although the presence of coronary artery calcium documents the presence  of coronary artery disease, the severity of this disease and any potential stenosis cannot be assessed on this non-gated CT examination. Assessment for potential risk factor modification, dietary therapy or pharmacologic therapy may be warranted, if clinically indicated.  OSA-failed CPAP therapy.  Currently using positional therapy and avoiding sleeping in the supine position.  He is followed by pulmonary  Essential Tremor -in December 2021 he was placed on propanolol as gabapentin was not controlling his essential tremor.  He reports that since placing on propranolol he has not noticed any improvement   GERD - takes Prilosec 20 daily.   All immunizations and health maintenance protocols were reviewed with the patient and needed orders were placed.  Appropriate screening laboratory values were ordered for the patient including screening of hyperlipidemia, renal function and hepatic function. If indicated by BPH, a PSA was ordered.  Medication reconciliation,  past medical history, social history, problem list and allergies were reviewed in detail with the patient  Goals were established with regard to weight loss, exercise, and  diet in compliance with medications  He is up to date on routine colon cancer screening   Review of Systems  Constitutional: Negative.   HENT: Positive for hearing loss.   Eyes: Negative.   Respiratory: Positive for shortness of breath.   Cardiovascular: Negative.   Gastrointestinal: Negative.   Endocrine: Negative.   Genitourinary: Negative.   Musculoskeletal: Positive for arthralgias.  Skin: Negative.   Allergic/Immunologic: Negative.   Neurological: Positive for tremors.  Hematological: Negative.   Psychiatric/Behavioral: Negative.   All other systems reviewed and are negative.  Past Medical History:  Diagnosis   Date  . ANEMIA-NOS 12/06/2008  . BACK PAIN 02/20/2010  . DIABETES MELLITUS, TYPE II 06/01/2008  . GERD 06/01/2008  . HYPERLIPIDEMIA  06/01/2008  . HYPERTENSION 06/01/2008  . Tremors of nervous system    pt states he has had tremors most of his life in his hands/hereditary    Social History   Socioeconomic History  . Marital status: Married    Spouse name: Not on file  . Number of children: Not on file  . Years of education: Not on file  . Highest education level: Not on file  Occupational History  . Not on file  Tobacco Use  . Smoking status: Current Every Day Smoker    Packs/day: 2.00    Years: 35.00    Pack years: 70.00    Types: Cigarettes    Last attempt to quit: 06/08/2018    Years since quitting: 2.3  . Smokeless tobacco: Never Used  . Tobacco comment: down to 1.5 ppd X8 mos.  plans to quit on upcoming birthday  Vaping Use  . Vaping Use: Never used  Substance and Sexual Activity  . Alcohol use: Yes    Alcohol/week: 2.0 - 3.0 standard drinks    Types: 2 - 3 Standard drinks or equivalent per week  . Drug use: No  . Sexual activity: Not on file  Other Topics Concern  . Not on file  Social History Narrative  . Not on file   Social Determinants of Health   Financial Resource Strain: Low Risk   . Difficulty of Paying Living Expenses: Not hard at all  Food Insecurity: No Food Insecurity  . Worried About Running Out of Food in the Last Year: Never true  . Ran Out of Food in the Last Year: Never true  Transportation Needs: No Transportation Needs  . Lack of Transportation (Medical): No  . Lack of Transportation (Non-Medical): No  Physical Activity: Inactive  . Days of Exercise per Week: 0 days  . Minutes of Exercise per Session: 0 min  Stress: No Stress Concern Present  . Feeling of Stress : Only a little  Social Connections: Moderately Isolated  . Frequency of Communication with Friends and Family: More than three times a week  . Frequency of Social Gatherings with Friends and Family: More than three times a week  . Attends Religious Services: Never  . Active Member of Clubs or Organizations:  No  . Attends Club or Organization Meetings: Never  . Marital Status: Married  Intimate Partner Violence: Not At Risk  . Fear of Current or Ex-Partner: No  . Emotionally Abused: No  . Physically Abused: No  . Sexually Abused: No    Past Surgical History:  Procedure Laterality Date  . COLONOSCOPY    . ELBOW BURSA SURGERY     right elbow  . ELBOW BURSA SURGERY Left 11/12/2018   Pt had left olecranon bursectomy  . HAND SURGERY     2 surgeries on right hand  . HAND SURGERY     left hand/removed a bone    Family History  Problem Relation Age of Onset  . Diabetes Sister   . Alzheimer's disease Sister   . Diabetes Brother   . Heart disease Mother   . Sudden death Father        Suicide after his wife passed away   . Colon cancer Neg Hx     No Known Allergies  Current Outpatient Medications on File Prior to Visit  Medication Sig Dispense Refill  .   amLODipine (NORVASC) 5 MG tablet TAKE 1 TABLET(5 MG) BY MOUTH DAILY 90 tablet 0  . Ascorbic Acid (VITAMIN C) 1000 MG tablet Take 1,000 mg by mouth daily.    Marland Kitchen aspirin EC 81 MG tablet Take 81 mg by mouth daily.    Marland Kitchen atorvastatin (LIPITOR) 40 MG tablet TAKE 1 TABLET(40 MG) BY MOUTH DAILY 90 tablet 3  . cholecalciferol (VITAMIN D3) 25 MCG (1000 UNIT) tablet Take 1,000 Units by mouth daily.    Marland Kitchen glucose blood (FREESTYLE TEST STRIPS) test strip Use as instructed 100 each 12  . glucose monitoring kit (FREESTYLE) monitoring kit USE TO TEST BLOOD GLUCOSE DAILY. 1 each 0  . ibuprofen (ADVIL) 200 MG tablet Take 200 mg by mouth 3 (three) times daily.    . Lancets (FREESTYLE) lancets Use as instructed 100 each 12  . lisinopril (ZESTRIL) 40 MG tablet TAKE 1 TABLET(40 MG) BY MOUTH DAILY 90 tablet 0  . omeprazole (PRILOSEC) 20 MG capsule TAKE 1 CAPSULE(20 MG) BY MOUTH DAILY 90 capsule 3  . metFORMIN (GLUCOPHAGE) 500 MG tablet Take 0.5 tablets (250 mg total) by mouth at bedtime. 45 tablet 1  . propranolol ER (INDERAL LA) 60 MG 24 hr capsule Take 1  capsule (60 mg total) by mouth at bedtime. 90 capsule 1   No current facility-administered medications on file prior to visit.    BP 128/80   Temp 97.6 F (36.4 C) (Oral)   Ht 5' 7" (1.702 m)   Wt 190 lb 2 oz (86.2 kg)   BMI 29.78 kg/m       Objective:   Physical Exam Vitals and nursing note reviewed.  Constitutional:      General: He is not in acute distress.    Appearance: Normal appearance. He is well-developed and normal weight.  HENT:     Head: Normocephalic and atraumatic.     Right Ear: Tympanic membrane, ear canal and external ear normal. There is no impacted cerumen.     Left Ear: Tympanic membrane, ear canal and external ear normal. There is no impacted cerumen.     Nose: Nose normal. No congestion or rhinorrhea.     Mouth/Throat:     Mouth: Mucous membranes are moist.     Pharynx: Oropharynx is clear. No oropharyngeal exudate or posterior oropharyngeal erythema.  Eyes:     General:        Right eye: No discharge.        Left eye: No discharge.     Extraocular Movements: Extraocular movements intact.     Conjunctiva/sclera: Conjunctivae normal.     Pupils: Pupils are equal, round, and reactive to light.  Neck:     Vascular: No carotid bruit.     Trachea: No tracheal deviation.  Cardiovascular:     Rate and Rhythm: Normal rate and regular rhythm.     Pulses: Normal pulses.     Heart sounds: Normal heart sounds. No murmur heard. No friction rub. No gallop.   Pulmonary:     Effort: Pulmonary effort is normal. No respiratory distress.     Breath sounds: Normal breath sounds. No stridor. No wheezing, rhonchi or rales.  Chest:     Chest wall: No tenderness.  Abdominal:     General: Bowel sounds are normal. There is no distension.     Palpations: Abdomen is soft. There is no mass.     Tenderness: There is no abdominal tenderness. There is no right CVA tenderness, left CVA tenderness, guarding or  rebound.     Hernia: No hernia is present.  Musculoskeletal:         General: No swelling, tenderness, deformity or signs of injury. Normal range of motion.     Right lower leg: No edema.     Left lower leg: No edema.  Lymphadenopathy:     Cervical: No cervical adenopathy.  Skin:    General: Skin is warm and dry.     Capillary Refill: Capillary refill takes less than 2 seconds.     Coloration: Skin is not jaundiced or pale.     Findings: No bruising, erythema, lesion or rash.  Neurological:     General: No focal deficit present.     Mental Status: He is alert and oriented to person, place, and time.     Cranial Nerves: No cranial nerve deficit.     Sensory: No sensory deficit.     Motor: Tremor present. No weakness.     Coordination: Coordination normal.     Gait: Gait normal.     Deep Tendon Reflexes: Reflexes normal.  Psychiatric:        Mood and Affect: Mood normal.        Behavior: Behavior normal.        Thought Content: Thought content normal.        Judgment: Judgment normal.       Assessment & Plan:  1. Type 2 diabetes mellitus with other diabetic kidney complication, without long-term current use of insulin (HCC) - Consider stopping metformin  - Follow up in 6 months  - CBC with Differential/Platelet; Future - Comprehensive metabolic panel; Future - Hemoglobin A1c; Future - Lipid panel; Future - TSH; Future - VITAMIN D 25 Hydroxy (Vit-D Deficiency, Fractures); Future - Vitamin B12; Future  2. Essential hypertension - Well controlled. No change in medications  - CBC with Differential/Platelet; Future - Comprehensive metabolic panel; Future - Hemoglobin A1c; Future - Lipid panel; Future - TSH; Future - VITAMIN D 25 Hydroxy (Vit-D Deficiency, Fractures); Future - Vitamin B12; Future - lisinopril (ZESTRIL) 40 MG tablet; Take 1 tablet (40 mg total) by mouth daily.  Dispense: 90 tablet; Refill: 3 - amLODipine (NORVASC) 5 MG tablet; Take daily  Dispense: 90 tablet; Refill: 3  3. Dyslipidemia - Controlled with Lipitor  - CBC  with Differential/Platelet; Future - Comprehensive metabolic panel; Future - Hemoglobin A1c; Future - Lipid panel; Future - TSH; Future - VITAMIN D 25 Hydroxy (Vit-D Deficiency, Fractures); Future - Vitamin B12; Future - atorvastatin (LIPITOR) 40 MG tablet; Take daily  Dispense: 90 tablet; Refill: 3  4. Essential tremor - Will try increasing Inderal to 120 mg  - CBC with Differential/Platelet; Future - Comprehensive metabolic panel; Future - Hemoglobin A1c; Future - Lipid panel; Future - TSH; Future - VITAMIN D 25 Hydroxy (Vit-D Deficiency, Fractures); Future - Vitamin B12; Future - propranolol ER (INDERAL LA) 120 MG 24 hr capsule; Take 1 capsule (120 mg total) by mouth daily.  Dispense: 90 capsule; Refill: 1  5. IPF (idiopathic pulmonary fibrosis) ([place]) - Follow up with pulmonary as directed  6. Tobacco use - needs to quit   7. Prostate cancer screening  - PSA; Future  8. OSA (obstructive sleep apnea) - Follow up with pulmonary as directed  9. Gastroesophageal reflux disease with esophagitis  - omeprazole (PRILOSEC) 20 MG capsule; TAKE 1 CAPSULE(20 MG) BY MOUTH DAILY  Dispense: 90 capsule; Refill: 3   [name], NP

## 2020-10-07 ENCOUNTER — Other Ambulatory Visit: Payer: Self-pay | Admitting: Adult Health

## 2020-10-07 DIAGNOSIS — E785 Hyperlipidemia, unspecified: Secondary | ICD-10-CM

## 2020-11-25 ENCOUNTER — Telehealth: Payer: Self-pay | Admitting: Pharmacist

## 2020-11-25 NOTE — Chronic Care Management (AMB) (Signed)
Chronic Care Management Pharmacy Assistant   Name: Richard [name]  MRN: [id number] DOB: [birth year]  Reason for Encounter: Disease State/ General Assessment Call.    Conditions to be addressed/monitored: HTN, HLD and DMII   Recent office visits:  09/27/20 [name] NP (PCP) - seen for type 2 diabetes and other chronic conditions. Changed amlodipine to 44m daily, atorvastatin 465mdaily, lisinopril to 4081maily and propanolol to 120m43mily. Will follow up regarding bloodwork.   Recent consult visits: None.   Hospital visits:  None in previous 6 months  Medications: Outpatient Encounter Medications as of 11/25/2020  Medication Sig  . amLODipine (NORVASC) 5 MG tablet Take daily  . Ascorbic Acid (VITAMIN C) 1000 MG tablet Take 1,000 mg by mouth daily.  . asMarland Kitchenirin EC 81 MG tablet Take 81 mg by mouth daily.  . atMarland Kitchenrvastatin (LIPITOR) 40 MG tablet TAKE 1 TABLET(40 MG) BY MOUTH DAILY  . cholecalciferol (VITAMIN D3) 25 MCG (1000 UNIT) tablet Take 1,000 Units by mouth daily.  . glMarland Kitchencose blood (FREESTYLE TEST STRIPS) test strip Use as instructed  . glucose monitoring kit (FREESTYLE) monitoring kit USE TO TEST BLOOD GLUCOSE DAILY.  . ibMarland Kitchenprofen (ADVIL) 200 MG tablet Take 200 mg by mouth 3 (three) times daily.  . Lancets (FREESTYLE) lancets Use as instructed  . lisinopril (ZESTRIL) 40 MG tablet Take 1 tablet (40 mg total) by mouth daily.  . metFORMIN (GLUCOPHAGE) 500 MG tablet Take 0.5 tablets (250 mg total) by mouth at bedtime.  . omMarland Kitchenprazole (PRILOSEC) 20 MG capsule TAKE 1 CAPSULE(20 MG) BY MOUTH DAILY  . propranolol ER (INDERAL LA) 120 MG 24 hr capsule Take 1 capsule (120 mg total) by mouth daily.   No facility-administered encounter medications on file as of 11/25/2020.    Reviewed chart prior to disease state call. Spoke with patient regarding BP  Recent Office Vitals: BP Readings from Last 3 Encounters:  09/27/20 128/80  06/23/20 128/72  03/23/20 134/78   Pulse Readings  from Last 3 Encounters:  06/23/20 75  03/23/20 84  05/04/19 84    Wt Readings from Last 3 Encounters:  09/27/20 190 lb 2 oz (86.2 kg)  06/23/20 182 lb (82.6 kg)  03/23/20 184 lb 3.2 oz (83.6 kg)     Kidney Function Lab Results  Component Value Date/Time   CREATININE 1.36 09/27/2020 08:23 AM   CREATININE 1.39 09/25/2019 08:33 AM   GFR 53.93 (L) 09/27/2020 08:23 AM   GFRNONAA 69.80 11/30/2009 08:51 AM    BMP Latest Ref Rng & Units 09/27/2020 09/25/2019 07/16/2018  Glucose 70 - 99 mg/dL 106(H) 104(H) 104(H)  BUN 6 - 23 mg/dL _0 Creatinine 0.40 - 1.50 mg/dL 1.36 1.39 1.35  Sodium 135 - 145 mEq/L 138 134(L) 134(L)  Potassium 3.5 - 5.1 mEq/L 4.5 5.1 4.7  Chloride 96 - 112 mEq/L 103 101 100  CO2 19 - 32 mEq/L _1 Calcium 8.4 - 10.5 mg/dL 9.6 9.6 9.8    . Current antihypertensive regimen:   amlodipine 5mg,64mtablet daily  lisinopril 40mg,76mablet daily . How often are you checking your Blood Pressure? infrequently . Current home BP readings: None to report.  . What recent interventions/DTPs have been made by any provider to improve Blood Pressure control since last CPP Visit: None.  . Any recent hospitalizations or ED visits since last visit with CPP? No . What diet changes have been made to improve Blood Pressure Control?  o Patient states  that he eats different things and usually eats whatever he has ready that's quick. Patient states he does not drink enough water and drinks sodas usually. Patient states he's does try to get at least one serving of fruits and vegetables a day.  . What exercise is being done to improve your Blood Pressure Control?  o Patient states he stays active everyday doing something. He dies not have a set schedule to walk or do anything like that but he does go and do things quite  A bit. Patient states he does yardwork and tings around home himself.   Adherence Review: Is the patient currently on ACE/ARB medication? Yes Does the patient  have >5 day gap between last estimated fill dates? No   Recent Relevant Labs: Lab Results  Component Value Date/Time   HGBA1C 5.1 09/27/2020 08:23 AM   HGBA1C 5.5 06/23/2020 08:23 AM   HGBA1C 5.5 03/23/2020 07:36 AM   HGBA1C 5.7 09/25/2019 08:33 AM   HGBA1C 5.3 05/16/2018 07:53 AM   MICROALBUR 5.3 (H) 05/30/2017 09:10 AM   MICROALBUR 5.7 (H) 04/27/2016 09:00 AM    Kidney Function Lab Results  Component Value Date/Time   CREATININE 1.36 09/27/2020 08:23 AM   CREATININE 1.39 09/25/2019 08:33 AM   GFR 53.93 (L) 09/27/2020 08:23 AM   GFRNONAA 69.80 11/30/2009 08:51 AM    . Current antihyperglycemic regimen:   Metformin 500 mg 1/2 tablet daily . What recent interventions/DTPs have been made to improve glycemic control:  o None.  . Have there been any recent hospitalizations or ED visits since last visit with CPP? No . Patient denies hypoglycemic symptoms. . Patient denies hyperglycemic symptoms. . How often are you checking your blood sugar? Patient does not check but once in a while.  . What are your blood sugars ranging?  o Fasting: around the 130 range o Before meals: None.  o After meals: None o Bedtime: None . During the week, how often does your blood glucose drop below 70? Never but patient is not sure.  . Are you checking your feet daily/regularly?   Adherence Review: Is the patient currently on a STATIN medication? Yes Is the patient currently on ACE/ARB medication? Yes Does the patient have >5 day gap between last estimated fill dates? No  Notes:  Spoke with patient and reviewed all medications as listed. Patient reports taking all medications and no issues at this time. Patient sated that the only change to his medications is that he takes the omeprazole every other day now instead of daily. Patient reports that he is not checking his blood pressure and only checks his blood sugar once in a while. I re encouraged patient to check his blood pressure at least once a  week and write the number down. I re encouraged patient to try and check his blood sugar once a day at least and write the number down. Patient was agreeable and verbalized understanding. Patient also states he drinks a lot of soda and tried to eat healthier but not necessarily. I reminded patient of Madeline's the clinical pharmacist care plan for his hypertension and diabetes. Patient thanked me for my call.   Star Rating Drugs:   atorvastatin 37m - [facility] Metformin 500 mg - last filled on 09/30/20 90DS at [facility]  lisinopril 443m- [phone]

## 2020-11-29 ENCOUNTER — Other Ambulatory Visit: Payer: Self-pay

## 2020-11-29 ENCOUNTER — Telehealth: Payer: Self-pay | Admitting: Adult Health

## 2020-11-29 MED ORDER — FREESTYLE TEST VI STRP: ORAL_STRIP | 12 refills | Status: DC

## 2020-11-29 MED ORDER — FREESTYLE SYSTEM KIT: PACK | 0 refills | Status: DC

## 2020-11-29 NOTE — Telephone Encounter (Signed)
Pt BS monitor broke pt would like to pick one up one from our office  glucose blood (FREESTYLE TEST STRIPS) test strip  glucose monitoring kit (FREESTYLE) monitoring kit if he can not get one from the office he would like one called into  [facility] [place], [place]  Phone:  [phone] Fax:  [phone]

## 2020-11-29 NOTE — Telephone Encounter (Signed)
Patient notified of update  and verbalized understanding. 

## 2020-11-29 NOTE — Telephone Encounter (Signed)
Rx sent to pharmacy   

## 2021-01-12 ENCOUNTER — Other Ambulatory Visit: Payer: Self-pay | Admitting: Adult Health

## 2021-01-12 DIAGNOSIS — E1129 Type 2 diabetes mellitus with other diabetic kidney complication: Secondary | ICD-10-CM

## 2021-03-08 ENCOUNTER — Other Ambulatory Visit: Payer: Self-pay | Admitting: Adult Health

## 2021-03-08 ENCOUNTER — Telehealth: Payer: Self-pay | Admitting: Pharmacist

## 2021-03-08 DIAGNOSIS — G250 Essential tremor: Secondary | ICD-10-CM

## 2021-03-08 NOTE — Chronic Care Management (AMB) (Signed)
    Chronic Care Management Pharmacy Assistant   Name: Richard [name]  MRN: [id number] DOB: 1951/04/10  03/09/21 APPOINTMENT REMINDER   Called Richard [name], No answer, left message of appointment on 03/09/21 at 9:30am via telephone visit with Jeni Salles, Pharm D. Notified to have all medications, supplements, blood pressure and/or blood sugar logs available during appointment and to return call if need to reschedule.  Care Gaps:  AWV - scheduled for 05/12/21 Ophthalmology exam - overdue since 2013 Covid-19 vaccine booster 4 - overdue since 10/07/20 Flu vaccine - due  Star Rating Drug:  Atorvastatin '40mg'$  - last filled on 01/12/21 90DS at [facility] Lisinopril '40mg'$  - last filled on 11/23/20 90DS at [facility] Metformin '500mg'$  - last filled on 01/12/21 90DS at [facility]  Any gaps in medications fill history? No.  [place]  Clinical Pharmacist Assistant [phone]

## 2021-03-09 ENCOUNTER — Telehealth: Payer: Medicare (Managed Care)

## 2021-03-09 ENCOUNTER — Telehealth: Payer: Self-pay | Admitting: Pharmacist

## 2021-03-09 NOTE — Progress Notes (Deleted)
Chronic Care Management Pharmacy Note  03/09/2021 Name:  Richard [name] MRN:  [id number] DOB:  [birth year]  Summary: ***   Recommendations/Changes made from today's visit: ***   Plan: ***  Subjective: Richard [name] is an 70 y.o. year old male who is a primary patient of [name], NP.  The CCM team was consulted for assistance with disease management and care coordination needs.    Engaged with patient by telephone for follow up visit in response to provider referral for pharmacy case management and/or care coordination services.   Consent to Services:  The patient was given information about Chronic Care Management services, agreed to services, and gave verbal consent prior to initiation of services.  Please see initial visit note for detailed documentation.   Patient Care Team: [name], NP as PCP - General (Family Medicine) Iran Planas, MD as Consulting Physician (Orthopedic Surgery) Viona Gilmore, [facility] as Pharmacist (Pharmacist)  Recent office visits: 09/27/20 [name] NP (PCP) - seen for type 2 diabetes and other chronic conditions. Changed amlodipine to 58m daily, atorvastatin 464mdaily, lisinopril to 4060maily and propanolol to 120m40mily. Will follow up regarding bloodwork.   Recent consult visits: 11/22/20 DaviPietro Cassis-C (audiology): Patient presented for hearing loss.   11/10/20 RonaLatanya Maudlinergeortho): Patient presented for heel pain follow up. Unable to access notes.  11/01/20 DaviPietro Cassis-C (audiology): Patient presented for hearing loss initial consult.  05/04/2019- Pulmonology- Patient presented to Dr. RakeKara Mead for IPF. Patient is a heavy smoker and has OSA, emphysema and ILD. Discussed smoking cessation. Patient has used Chantix in the past; does not want to use nicotine replacement. Patient stopped using CPAP, patient was advised weight gain and to avoid sleeping in supine position.   Hospital visits: None in  previous 6 months  Objective:  Lab Results  Component Value Date   CREATININE 1.36 09/27/2020   BUN 15 09/27/2020   GFR 53.93 (L) 09/27/2020   GFRNONAA 69.80 11/30/2009   NA 138 09/27/2020   K 4.5 09/27/2020   CALCIUM 9.6 09/27/2020   CO2 28 09/27/2020    Lab Results  Component Value Date/Time   HGBA1C 5.1 09/27/2020 08:23 AM   HGBA1C 5.5 06/23/2020 08:23 AM   HGBA1C 5.5 03/23/2020 07:36 AM   HGBA1C 5.7 09/25/2019 08:33 AM   HGBA1C 5.3 05/16/2018 07:53 AM   GFR 53.93 (L) 09/27/2020 08:23 AM   GFR 51.08 (L) 09/25/2019 08:33 AM   MICROALBUR 5.3 (H) 05/30/2017 09:10 AM   MICROALBUR 5.7 (H) 04/27/2016 09:00 AM    Last diabetic Eye exam:  Lab Results  Component Value Date/Time   HMDIABEYEEXA no diab. retinopathy 04/23/2011 12:00 AM    Last diabetic Foot exam:  Lab Results  Component Value Date/Time   HMDIABFOOTEX yes 12/12/2009 12:00 AM     Lab Results  Component Value Date   CHOL 124 09/27/2020   HDL 49.40 09/27/2020   LDLCALC 60 09/27/2020   TRIG 72.0 09/27/2020   CHOLHDL 3 09/27/2020    Hepatic Function Latest Ref Rng & Units 09/27/2020 09/25/2019 07/16/2018  Total Protein 6.0 - 8.3 g/dL 7.0 7.2 7.2  Albumin 3.5 - 5.2 g/dL 4.2 4.5 4.4  AST 0 - 37 U/L _0 ALT 0 - 53 U/L _1 Alk Phosphatase 39 - 117 U/L 82 79 74  Total Bilirubin 0.2 - 1.2 mg/dL 0.7 1.0 0.8  Bilirubin, Direct 0.0 - 0.3 mg/dL - - -  Lab Results  Component Value Date/Time   TSH 1.08 09/27/2020 08:23 AM   TSH 1.04 09/25/2019 08:33 AM    CBC Latest Ref Rng & Units 09/27/2020 09/25/2019 07/16/2018  WBC 4.0 - 10.5 K/uL 5.0 6.9 7.3  Hemoglobin 13.0 - 17.0 g/dL 15.9 15.5 14.9  Hematocrit 39.0 - 52.0 % 45.5 43.6 42.7  Platelets 150.0 - 400.0 K/uL 194.0 210.0 226.0    Lab Results  Component Value Date/Time   VD25OH 61.84 09/27/2020 08:23 AM    Clinical ASCVD: No  The ASCVD Risk score (Arnett DK, et al., 2019) failed to calculate for the following reasons:   The valid total  cholesterol range is 130 to 320 mg/dL    Depression screen [facility] 2/9 05/06/2020 09/24/2019 07/16/2018  Decreased Interest 0 0 0  Down, Depressed, Hopeless 0 0 0  PHQ - 2 Score 0 0 0  Altered sleeping 0 - -  Tired, decreased energy 0 - -  Change in appetite 0 - -  Feeling bad or failure about yourself  0 - -  Trouble concentrating 0 - -  Moving slowly or fidgety/restless 0 - -  Suicidal thoughts 0 - -  PHQ-9 Score 0 - -  Difficult doing work/chores Not difficult at all - -      Social History   Tobacco Use  Smoking Status Every Day   Packs/day: 2.00   Years: 35.00   Pack years: 70.00   Types: Cigarettes   Last attempt to quit: 06/08/2018   Years since quitting: 2.7  Smokeless Tobacco Never  Tobacco Comments   down to 1.5 ppd X8 mos.  plans to quit on upcoming birthday   BP Readings from Last 3 Encounters:  09/27/20 128/80  06/23/20 128/72  03/23/20 134/78   Pulse Readings from Last 3 Encounters:  06/23/20 75  03/23/20 84  05/04/19 84   Wt Readings from Last 3 Encounters:  09/27/20 190 lb 2 oz (86.2 kg)  06/23/20 182 lb (82.6 kg)  03/23/20 184 lb 3.2 oz (83.6 kg)    Assessment/Interventions: Review of patient past medical history, allergies, medications, health status, including review of consultants reports, laboratory and other test data, was performed as part of comprehensive evaluation and provision of chronic care management services.   SDOH:  (Social Determinants of Health) assessments and interventions performed: No   CCM Care Plan  No Known Allergies  Medications Reviewed Today     Reviewed by Rodrigo Ran, CMA (Certified Medical Assistant) on 09/27/20 at [phone]  Med List Status: <None>   Medication Order Taking? Sig Documenting Provider Last Dose Status Informant  amLODipine (NORVASC) 5 MG tablet [id number]  TAKE 1 TABLET(5 MG) BY MOUTH DAILY Nafziger, Tommi Rumps, NP  Active   Ascorbic Acid (VITAMIN C) 1000 MG tablet [id number]  Take 1,000 mg by mouth daily.  [provider]  Active Self  aspirin EC 81 MG tablet [id number]  Take 81 mg by mouth daily. [provider]  Active Self  atorvastatin (LIPITOR) 40 MG tablet [id number]  TAKE 1 TABLET(40 MG) BY MOUTH DAILY Nafziger, Tommi Rumps, NP  Active   cholecalciferol (VITAMIN D3) 25 MCG (1000 UNIT) tablet [id number]  Take 1,000 Units by mouth daily. [provider]  Active Self  glucose blood (FREESTYLE TEST STRIPS) test strip [id number]  Use as instructed Nafziger, Tommi Rumps, NP  Active   glucose monitoring kit (FREESTYLE) monitoring kit [id number]  USE TO TEST BLOOD GLUCOSE DAILY. Nafziger, Tommi Rumps, NP  Active   ibuprofen (ADVIL) 200  MG tablet [id number]  Take 200 mg by mouth 3 (three) times daily. [provider]  Active Self  Lancets (FREESTYLE) lancets [id number]  Use as instructed Nafziger, Tommi Rumps, NP  Active   lisinopril (ZESTRIL) 40 MG tablet [id number]  TAKE 1 TABLET(40 MG) BY MOUTH DAILY Nafziger, Tommi Rumps, NP  Active   metFORMIN (GLUCOPHAGE) 500 MG tablet [id number]  Take 0.5 tablets (250 mg total) by mouth at bedtime. Nafziger, Tommi Rumps, NP  Active   omeprazole (PRILOSEC) 20 MG capsule [id number]  TAKE 1 CAPSULE(20 MG) BY MOUTH DAILY Nafziger, Tommi Rumps, NP  Active   propranolol ER (INDERAL LA) 60 MG 24 hr capsule [id number]  Take 1 capsule (60 mg total) by mouth at bedtime. [name], NP  Active             Patient Active Problem List   Diagnosis Date Noted   IPF (idiopathic pulmonary fibrosis) ([place]) 07/23/2018   Pulmonary nodule 07/23/2018   OSA (obstructive sleep apnea) 06/18/2018   Alcohol abuse 10/31/2016   Chronic renal insufficiency, stage II (mild) 03/22/2014   Benign familial tremor 03/22/2014   Macrocytosis without anemia 12/31/2011   Tobacco use 12/31/2011   BACK PAIN 02/20/2010   Diabetes mellitus with renal complications ([place]) 81/07/7508   Dyslipidemia 06/01/2008   Essential hypertension 06/01/2008   GERD 06/01/2008    Immunization History  Administered Date(s)  Administered   Fluad Quad(high Dose 65+) 06/23/2020   Influenza, Seasonal, Injecte, Preservative Fre 03/10/2019   Influenza,inj,Quad PF,6+ Mos 04/05/2015, 05/01/2016, 05/16/2018   Influenza-Unspecified 04/22/2017   PFIZER(Purple Top)SARS-COV-2 Vaccination 09/03/2019, 09/30/2019, 06/08/2020   Pneumococcal Conjugate-13 07/16/2018   Pneumococcal Polysaccharide-23 05/30/2017   Td 07/02/2005   Zoster Recombinat (Shingrix) 06/19/2018, 11/10/2018    Conditions to be addressed/monitored:  Hypertension, Hyperlipidemia, Diabetes, GERD, Chronic Kidney Disease, Tobacco use and IPF  There are no care plans that you recently modified to display for this patient.   Medication Assistance: None required.  Patient affirms current coverage meets needs.  Compliance/Adherence/Medication fill history: Care Gaps: COVID booster, influenza vaccine, eye exam   Star-Rating Drugs: Atorvastatin 74m - last filled on 01/12/21 90DS at [facility] Lisinopril 425m- last filled on 11/23/20 [facility] Metformin 50031m [facility] [id number]: [phone]: [phone] Mail Service  ([facility] [place]one: [phone]: [phone] pill box? Yes Pt endorses 100% compliance  We discussed: Current pharmacy is preferred with insurance plan and patient is satisfied with pharmacy services Patient decided to: Continue current medication management strategy  Care Plan and Follow Up Patient Decision:  Patient agrees to Care Plan and Follow-up.  Plan: Telephone follow up appointment with care management team member scheduled for:  6 months  MadJeni SallesharmD BCADauphinharmacist [phone]

## 2021-03-09 NOTE — Telephone Encounter (Signed)
  Chronic Care Management   Outreach Note  03/09/2021 Name: Richard [name] MRN: [id number] DOB: Mar 01, 1951  Referred by: [name], NP  Patient had a phone appointment scheduled with clinical pharmacist today.  An unsuccessful telephone outreach was attempted today. The patient was referred to the pharmacist for assistance with care management and care coordination.   If possible, a message was left to return call to: [phone] or to [facility]: [place], PharmD, [place] [facility] [place]

## 2021-03-29 ENCOUNTER — Other Ambulatory Visit: Payer: Self-pay

## 2021-03-30 ENCOUNTER — Ambulatory Visit (INDEPENDENT_AMBULATORY_CARE_PROVIDER_SITE_OTHER): Payer: Medicare (Managed Care) | Admitting: Adult Health

## 2021-03-30 ENCOUNTER — Encounter: Payer: Self-pay | Admitting: Adult Health

## 2021-03-30 VITALS — BP 140/80 | HR 74 | Temp 98.6°F | Ht 67.0 in | Wt 191.0 lb

## 2021-03-30 DIAGNOSIS — E1129 Type 2 diabetes mellitus with other diabetic kidney complication: Secondary | ICD-10-CM | POA: Diagnosis not present

## 2021-03-30 DIAGNOSIS — Z23 Encounter for immunization: Secondary | ICD-10-CM

## 2021-03-30 DIAGNOSIS — K4090 Unilateral inguinal hernia, without obstruction or gangrene, not specified as recurrent: Secondary | ICD-10-CM | POA: Diagnosis not present

## 2021-03-30 DIAGNOSIS — I10 Essential (primary) hypertension: Secondary | ICD-10-CM

## 2021-03-30 LAB — POCT GLYCOSYLATED HEMOGLOBIN (HGB A1C): Hemoglobin A1C: 5.4 % (ref 4.0–5.6)

## 2021-03-30 NOTE — Progress Notes (Signed)
Subjective:    Patient ID: Richard [name], male    DOB: Dec 16, 1950, 70 y.o.   MRN: [id number]  HPI  70 year old male who  has a past medical history of ANEMIA-NOS (12/06/2008), BACK PAIN (02/20/2010), DIABETES MELLITUS, TYPE II (06/01/2008), GERD (06/01/2008), HYPERLIPIDEMIA (06/01/2008), HYPERTENSION (06/01/2008), and Tremors of nervous system.  He presents to the office today for 76-month follow-up regarding diabetes and hypertension.  DM II -currently maintained on metformin 250 mg daily.  His last A1c was 5.1 in March 2022.  He does not check his blood sugars on a routine basis.  He does try and stay active and eat a heart healthy diet Lab Results  Component Value Date   HGBA1C 5.4 03/30/2021   Essential hypertension-is well controlled with lisinopril 40 mg daily and Norvasc 5 mg.  He denies episodes of dizziness, lightheadedness, chest pain, or shortness of breath.   BP Readings from Last 3 Encounters:  03/30/21 140/80  09/27/20 128/80  06/23/20 128/72   Acute Issue - Has noted a " bulge" in his right groin. Denies pain, bruising, issues with BM.   Review of Systems See HPI   Past Medical History:  Diagnosis Date   ANEMIA-NOS 12/06/2008   BACK PAIN 02/20/2010   DIABETES MELLITUS, TYPE II 06/01/2008   GERD 06/01/2008   HYPERLIPIDEMIA 06/01/2008   HYPERTENSION 06/01/2008   Tremors of nervous system    pt states he has had tremors most of his life in his hands/hereditary    Social History   Socioeconomic History   Marital status: Married    Spouse name: Not on file   Number of children: Not on file   Years of education: Not on file   Highest education level: Not on file  Occupational History   Not on file  Tobacco Use   Smoking status: Every Day    Packs/day: 2.00    Years: 35.00    Pack years: 70.00    Types: Cigarettes    Last attempt to quit: 06/08/2018    Years since quitting: 2.8   Smokeless tobacco: Never   Tobacco comments:    down to 1.5 ppd X8 mos.  plans to  quit on upcoming birthday  Vaping Use   Vaping Use: Never used  Substance and Sexual Activity   Alcohol use: Yes    Alcohol/week: 2.0 - 3.0 standard drinks    Types: 2 - 3 Standard drinks or equivalent per week   Drug use: No   Sexual activity: Not on file  Other Topics Concern   Not on file  Social History Narrative   Not on file   Social Determinants of Health   Financial Resource Strain: Low Risk    Difficulty of Paying Living Expenses: Not hard at all  Food Insecurity: No Food Insecurity   Worried About Programme researcher, broadcasting/film/video in the Last Year: Never true   Ran Out of Food in the Last Year: Never true  Transportation Needs: No Transportation Needs   Lack of Transportation (Medical): No   Lack of Transportation (Non-Medical): No  Physical Activity: Inactive   Days of Exercise per Week: 0 days   Minutes of Exercise per Session: 0 min  Stress: No Stress Concern Present   Feeling of Stress : Only a little  Social Connections: Moderately Isolated   Frequency of Communication with Friends and Family: More than three times a week   Frequency of Social Gatherings with Friends and Family: More than three  times a week   Attends Religious Services: Never   Active Member of Clubs or Organizations: No   Attends Music therapist: Never   Marital Status: Married  Human resources officer Violence: Not At Risk   Fear of Current or Ex-Partner: No   Emotionally Abused: No   Physically Abused: No   Sexually Abused: No    Past Surgical History:  Procedure Laterality Date   COLONOSCOPY     ELBOW BURSA SURGERY     right elbow   ELBOW BURSA SURGERY Left 11/12/2018   Pt had left olecranon bursectomy   HAND SURGERY     2 surgeries on right hand   HAND SURGERY     left hand/removed a bone    Family History  Problem Relation Age of Onset   Diabetes Sister    Alzheimer's disease Sister    Diabetes Brother    Heart disease Mother    Sudden death Father        Suicide after his  wife passed away    Colon cancer Neg Hx     No Known Allergies  Current Outpatient Medications on File Prior to Visit  Medication Sig Dispense Refill   amLODipine (NORVASC) 5 MG tablet Take daily 90 tablet 3   Ascorbic Acid (VITAMIN C) 1000 MG tablet Take 1,000 mg by mouth daily.     aspirin EC 81 MG tablet Take 81 mg by mouth daily.     atorvastatin (LIPITOR) 40 MG tablet TAKE 1 TABLET(40 MG) BY MOUTH DAILY 90 tablet 1   cholecalciferol (VITAMIN D3) 25 MCG (1000 UNIT) tablet Take 1,000 Units by mouth daily.     glucose blood (FREESTYLE TEST STRIPS) test strip Use as instructed 100 each 12   glucose monitoring kit (FREESTYLE) monitoring kit USE TO TEST BLOOD GLUCOSE DAILY. 1 each 0   ibuprofen (ADVIL) 200 MG tablet Take 200 mg by mouth 3 (three) times daily.     Lancets (FREESTYLE) lancets Use as instructed 100 each 12   lisinopril (ZESTRIL) 40 MG tablet Take 1 tablet (40 mg total) by mouth daily. 90 tablet 3   metFORMIN (GLUCOPHAGE) 500 MG tablet TAKE 1/2 TABLET(250 MG) BY MOUTH AT BEDTIME 45 tablet 1   omeprazole (PRILOSEC) 20 MG capsule TAKE 1 CAPSULE(20 MG) BY MOUTH DAILY 90 capsule 3   propranolol ER (INDERAL LA) 120 MG 24 hr capsule TAKE 1 CAPSULE(120 MG) BY MOUTH DAILY 90 capsule 1   No current facility-administered medications on file prior to visit.    BP 140/80   Pulse 74   Temp 98.6 F (37 C) (Oral)   Ht $R'5\' 7"'nJ$  (1.702 m)   Wt 191 lb (86.6 kg)   SpO2 98%   BMI 29.91 kg/m       Objective:   Physical Exam Vitals and nursing note reviewed.  Constitutional:      Appearance: Normal appearance.  Cardiovascular:     Rate and Rhythm: Normal rate and regular rhythm.     Pulses: Normal pulses.     Heart sounds: Normal heart sounds.  Pulmonary:     Effort: Pulmonary effort is normal.     Breath sounds: Normal breath sounds.  Abdominal:     Hernia: A hernia (right inguinal hernia - easily reduced.) is present.  Musculoskeletal:        General: Normal range of motion.   Skin:    General: Skin is warm and dry.     Capillary Refill: Capillary refill takes  less than 2 seconds.  Neurological:     General: No focal deficit present.     Mental Status: He is alert and oriented to person, place, and time.  Psychiatric:        Mood and Affect: Mood normal.        Behavior: Behavior normal.        Thought Content: Thought content normal.        Judgment: Judgment normal.      Assessment & Plan:  1. Type 2 diabetes mellitus with other diabetic kidney complication, without long-term current use of insulin (HCC)  - POC HgB A1c- 5.4  - Continue with Metformin $RemoveBefore'250mg'waeknafJziHQj$  daily.  - Follow up in 6 months for CPE or sooner if needed  2. Essential hypertension - Controlled. No change in medications   3. Need for immunization against influenza  - Flu Vaccine QUAD 104mo+IM (Fluarix, Fluzone & Alfiuria Quad PF)  4. Right inguinal hernia - Discussed options with the patient. Since he is not having any issues then he will wait on surgery. Return precautions reviewed   [name], NP

## 2021-04-19 ENCOUNTER — Telehealth: Payer: Self-pay | Admitting: Pharmacist

## 2021-04-19 NOTE — Chronic Care Management (AMB) (Signed)
  Chronic Care Management Pharmacy Assistant   Name: Richard [name]  MRN: [id number] DOB: 09/12/1950  Reason for Encounter: Disease State/ General Assessment Call.    Conditions to be addressed/monitored: HTN, HLD, and DMII   Recent office visits:  03/30/21 [name] Nafziger NP (PCP) - seen for type 2 diabetes mellitus with other diabetic kidney complication, without long term current use of insulin. No medication changes. Follow up in 6 months.  Recent consult visits:  None.  Hospital visits:  None in previous 6 months  Medications: Outpatient Encounter Medications as of 04/19/2021  Medication Sig   amLODipine (NORVASC) 5 MG tablet Take daily   Ascorbic Acid (VITAMIN C) 1000 MG tablet Take 1,000 mg by mouth daily.   aspirin EC 81 MG tablet Take 81 mg by mouth daily.   atorvastatin (LIPITOR) 40 MG tablet TAKE 1 TABLET(40 MG) BY MOUTH DAILY   cholecalciferol (VITAMIN D3) 25 MCG (1000 UNIT) tablet Take 1,000 Units by mouth daily.   glucose blood (FREESTYLE TEST STRIPS) test strip Use as instructed   glucose monitoring kit (FREESTYLE) monitoring kit USE TO TEST BLOOD GLUCOSE DAILY.   ibuprofen (ADVIL) 200 MG tablet Take 200 mg by mouth 3 (three) times daily.   Lancets (FREESTYLE) lancets Use as instructed   lisinopril (ZESTRIL) 40 MG tablet Take 1 tablet (40 mg total) by mouth daily.   metFORMIN (GLUCOPHAGE) 500 MG tablet TAKE 1/2 TABLET(250 MG) BY MOUTH AT BEDTIME   omeprazole (PRILOSEC) 20 MG capsule TAKE 1 CAPSULE(20 MG) BY MOUTH DAILY   propranolol ER (INDERAL LA) 120 MG 24 hr capsule TAKE 1 CAPSULE(120 MG) BY MOUTH DAILY   No facility-administered encounter medications on file as of 04/19/2021.   Fill History: ATORVASTATIN 40MG TABLETS 04/11/2021 90   LISINOPRIL 40MG TABLETS 04/11/2021 90   OMEPRAZOLE 20MG CAPSULES 01/12/2021 90   PROPRANOLOL ER 120MG CAPSULES 03/08/2021 90   AMLODIPINE BESYLATE 5MG TABLETS 03/08/2021 90   METFORMIN 500MG TABLETS 01/12/2021 90    Recent Relevant Labs: Lab Results  Component Value Date/Time   HGBA1C 5.4 03/30/2021 07:05 AM   HGBA1C 5.1 09/27/2020 08:23 AM   HGBA1C 5.5 06/23/2020 08:23 AM   HGBA1C 5.7 09/25/2019 08:33 AM   HGBA1C 5.3 05/16/2018 07:53 AM   MICROALBUR 5.3 (H) 05/30/2017 09:10 AM   MICROALBUR 5.7 (H) 04/27/2016 09:00 AM    Kidney Function Lab Results  Component Value Date/Time   CREATININE 1.36 09/27/2020 08:23 AM   CREATININE 1.39 09/25/2019 08:33 AM   GFR 53.93 (L) 09/27/2020 08:23 AM   GFRNONAA 69.80 11/30/2009 08:51 AM    Current antihyperglycemic regimen:  Metformin 500 mg 1/2 tablet daily What recent interventions/DTPs have been made to improve glycemic control:  None Have there been any recent hospitalizations or ED visits since last visit with CPP? No Patient hypoglycemic symptoms, including  Patient hyperglycemic symptoms, including  How often are you checking your blood sugar?  What are your blood sugars ranging?  Fasting:  Before meals:  After meals:  Bedtime:  During the week, how often does your blood glucose drop below 70?  Are you checking your feet daily/regularly?   Adherence Review: Is the patient currently on a STATIN medication? Yes Is the patient currently on ACE/ARB medication? Yes Does the patient have >5 day gap between last estimated fill dates? No   Reviewed chart prior to disease state call. Spoke with patient regarding BP  Recent Office Vitals: BP Readings from Last 3 Encounters:  03/30/21 140/80  09/27/20   128/80  06/23/20 128/72   Pulse Readings from Last 3 Encounters:  03/30/21 74  06/23/20 75  03/23/20 84    Wt Readings from Last 3 Encounters:  03/30/21 191 lb (86.6 kg)  09/27/20 190 lb 2 oz (86.2 kg)  06/23/20 182 lb (82.6 kg)     Kidney Function Lab Results  Component Value Date/Time   CREATININE 1.36 09/27/2020 08:23 AM   CREATININE 1.39 09/25/2019 08:33 AM   GFR 53.93 (L) 09/27/2020 08:23 AM   GFRNONAA 69.80 11/30/2009 08:51  AM    BMP Latest Ref Rng & Units 09/27/2020 09/25/2019 07/16/2018  Glucose 70 - 99 mg/dL 106(H) 104(H) 104(H)  BUN 6 - 23 mg/dL 15 17 15  Creatinine 0.40 - 1.50 mg/dL 1.36 1.39 1.35  Sodium 135 - 145 mEq/L 138 134(L) 134(L)  Potassium 3.5 - 5.1 mEq/L 4.5 5.1 4.7  Chloride 96 - 112 mEq/L 103 101 100  CO2 19 - 32 mEq/L 28 26 28  Calcium 8.4 - 10.5 mg/dL 9.6 9.6 9.8    Current antihypertensive regimen:  amlodipine 5mg, 1 tablet daily lisinopril 40mg, 1 tablet daily How often are you checking your Blood Pressure?  Current home BP readings:  What recent interventions/DTPs have been made by any provider to improve Blood Pressure control since last CPP Visit: None. Any recent hospitalizations or ED visits since last visit with CPP? No  Adherence Review: Is the patient currently on ACE/ARB medication? Yes Does the patient have >5 day gap between last estimated fill dates? No  Multiple unsuccessful attempts to reach patient.    Care Gaps:  AWV - scheduled for 05/12/21 Ophthalmology exam - overdue since 2013 Covid-19 vaccine booster 4 - overdue since 10/07/20 Flu vaccine - due  Star Rating Drugs:  Atorvastatin 40mg - last filled on 04/11/21 90DS at [facility] Lisinopril 40mg - last filled on 04/11/21 90DS at [facility] Metformin 500mg - last filled on 01/12/21 90DS at [facility]  Chelsie Lowe CMA  Clinical Pharmacist Assistant [phone]  

## 2021-04-21 NOTE — Telephone Encounter (Cosign Needed)
2nd attempt

## 2021-04-25 NOTE — Telephone Encounter (Cosign Needed)
3rd attempt

## 2021-05-09 ENCOUNTER — Telehealth: Payer: Self-pay | Admitting: Pharmacist

## 2021-05-09 NOTE — Chronic Care Management (AMB) (Signed)
Chronic Care Management Pharmacy Assistant   Name: Richard [name]  MRN: [id number] DOB: [birth year]  Reason for Encounter: Disease State / Hypertension and Diabetes Assessment Call   Conditions to be addressed/monitored: HTN and DMII  Recent office visits:  None  Recent consult visits:  None  Hospital visits:  None  Medications: Outpatient Encounter Medications as of 05/09/2021  Medication Sig   amLODipine (NORVASC) 5 MG tablet Take daily   Ascorbic Acid (VITAMIN C) 1000 MG tablet Take 1,000 mg by mouth daily.   aspirin EC 81 MG tablet Take 81 mg by mouth daily.   atorvastatin (LIPITOR) 40 MG tablet TAKE 1 TABLET(40 MG) BY MOUTH DAILY   cholecalciferol (VITAMIN D3) 25 MCG (1000 UNIT) tablet Take 1,000 Units by mouth daily.   glucose blood (FREESTYLE TEST STRIPS) test strip Use as instructed   glucose monitoring kit (FREESTYLE) monitoring kit USE TO TEST BLOOD GLUCOSE DAILY.   ibuprofen (ADVIL) 200 MG tablet Take 200 mg by mouth 3 (three) times daily.   Lancets (FREESTYLE) lancets Use as instructed   lisinopril (ZESTRIL) 40 MG tablet Take 1 tablet (40 mg total) by mouth daily.   metFORMIN (GLUCOPHAGE) 500 MG tablet TAKE 1/2 TABLET(250 MG) BY MOUTH AT BEDTIME   omeprazole (PRILOSEC) 20 MG capsule TAKE 1 CAPSULE(20 MG) BY MOUTH DAILY   propranolol ER (INDERAL LA) 120 MG 24 hr capsule TAKE 1 CAPSULE(120 MG) BY MOUTH DAILY   No facility-administered encounter medications on file as of 05/09/2021.   Fill History: ATORVASTATIN 40MG  TABLETS 04/11/2021 90    LISINOPRIL 40MG  TABLETS 04/11/2021 90    OMEPRAZOLE 20MG  CAPSULES 01/12/2021 90    PROPRANOLOL ER 120MG  CAPSULES 03/08/2021 90    AMLODIPINE BESYLATE 5MG  TABLETS 03/08/2021 90    METFORMIN 500MG  TAB 05/07/2021 90    Recent Relevant Labs: Lab Results  Component Value Date/Time   HGBA1C 5.4 03/30/2021 07:05 AM   HGBA1C 5.1 09/27/2020 08:23 AM   HGBA1C 5.5 06/23/2020 08:23 AM   HGBA1C 5.7 09/25/2019 08:33 AM    HGBA1C 5.3 05/16/2018 07:53 AM   MICROALBUR 5.3 (H) 05/30/2017 09:10 AM   MICROALBUR 5.7 (H) 04/27/2016 09:00 AM    Kidney Function Lab Results  Component Value Date/Time   CREATININE 1.36 09/27/2020 08:23 AM   CREATININE 1.39 09/25/2019 08:33 AM   GFR 53.93 (L) 09/27/2020 08:23 AM   GFRNONAA 69.80 11/30/2009 08:51 AM    Current antihyperglycemic regimen:  Metformin 500 mg 1/2 tablet daily  What recent interventions/DTPs have been made to improve glycemic control:  None  Have there been any recent hospitalizations or ED visits since last visit with CPP? No  Patient denies hypoglycemic symptoms, including None  Patient denies hyperglycemic symptoms, including none  How often are you checking your blood sugar? Once every two weeks  What are your blood sugars ranging?  Fasting: last reading was 128, patient states his blood sugars always run between 128-134  During the week, how often does your blood glucose drop below 70? Never  Are you checking your feet daily/regularly? Yes  Adherence Review: Is the patient currently on a STATIN medication? Yes Is the patient currently on ACE/ARB medication? Yes Does the patient have >5 day gap between last estimated fill dates? No   Reviewed chart prior to disease state call. Spoke with patient regarding BP  Recent Office Vitals: BP Readings from Last 3 Encounters:  03/30/21 140/80  09/27/20 128/80  06/23/20 128/72   Pulse Readings from Last 3 Encounters:  03/30/21 74  06/23/20 75  03/23/20 84    Wt Readings from Last 3 Encounters:  03/30/21 191 lb (86.6 kg)  09/27/20 190 lb 2 oz (86.2 kg)  06/23/20 182 lb (82.6 kg)     Kidney Function Lab Results  Component Value Date/Time   CREATININE 1.36 09/27/2020 08:23 AM   CREATININE 1.39 09/25/2019 08:33 AM   GFR 53.93 (L) 09/27/2020 08:23 AM   GFRNONAA 69.80 11/30/2009 08:51 AM    BMP Latest Ref Rng & Units 09/27/2020 09/25/2019 07/16/2018  Glucose 70 - 99 mg/dL 106(H)  104(H) 104(H)  BUN 6 - 23 mg/dL 15 17 15   Creatinine 0.40 - 1.50 mg/dL 1.36 1.39 1.35  Sodium 135 - 145 mEq/L 138 134(L) 134(L)  Potassium 3.5 - 5.1 mEq/L 4.5 5.1 4.7  Chloride 96 - 112 mEq/L 103 101 100  CO2 19 - 32 mEq/L 28 26 28   Calcium 8.4 - 10.5 mg/dL 9.6 9.6 9.8    Current antihypertensive regimen:  Amlodipine 5mg , 1 tablet daily Lisinopril 40mg , 1 tablet daily  How often are you checking your Blood Pressure?  Very infrequently  Current home BP readings: Patient states his last reading was around 128/78 well over a month ago.  What recent interventions/DTPs have been made by any provider to improve Blood Pressure control since last CPP Visit: None  Any recent hospitalizations or ED visits since last visit with CPP? No  What diet changes have been made to improve Blood Pressure Control?  Patient has not made any diet changes, he states he does all the cooking and he makes whatever he is [place] for. He has not cut back on any carbs however, he does eat meat and a lot of vegetables.   What exercise is being done to improve your Blood Pressure Control?  Patient states he stays busy, he cares for his wife, does all the housework and yard work and works on old cars as a hobby and spends a lot of time doing this.  Adherence Review: Is the patient currently on ACE/ARB medication? Yes Does the patient have >5 day gap between last estimated fill dates? No   Care Gaps: AWV - scheduled for 05/12/21 Last BP - 140/80 on 03/30/2021 Last A1C - 5.4 on 03/30/2021 Ophthalmology exam - overdue Covid-19 vaccine - overdue  Star Rating Drugs: Atorvastatin 40mg  - last filled on 04/11/21 90DS at [facility] Lisinopril 40mg  - last filled on 04/11/21 90DS at [facility] Metformin 500mg  - last filled on 05/07/2021 90DS at [facility] [phone]

## 2021-05-12 ENCOUNTER — Ambulatory Visit (INDEPENDENT_AMBULATORY_CARE_PROVIDER_SITE_OTHER): Payer: Medicare (Managed Care)

## 2021-05-12 VITALS — BP 128/68 | HR 62 | Ht 67.0 in | Wt 191.0 lb

## 2021-05-12 DIAGNOSIS — Z0000 Encounter for general adult medical examination without abnormal findings: Secondary | ICD-10-CM

## 2021-05-12 NOTE — Progress Notes (Signed)
 Subjective:   Royce D Wery is a 70 y.o. male who presents for Medicare Annual/Subsequent preventive examination.  Review of Systems    No ROS  Cardiac Risk Factors include: hypertension;diabetes mellitus     Objective:    Today's Vitals   05/12/21 0830  BP: 128/68  Pulse: 62  SpO2: 97%  Weight: 191 lb (86.6 kg)  Height: 5' 7" (1.702 m)   Body mass index is 29.91 kg/m.  Advanced Directives 05/12/2021 05/06/2020  Does Patient Have a Medical Advance Directive? No No  Would patient like information on creating a medical advance directive? No - Patient declined No - Patient declined    Current Medications (verified) Outpatient Encounter Medications as of 05/12/2021  Medication Sig   amLODipine (NORVASC) 5 MG tablet Take daily   Ascorbic Acid (VITAMIN C) 1000 MG tablet Take 1,000 mg by mouth daily.   aspirin EC 81 MG tablet Take 81 mg by mouth daily.   atorvastatin (LIPITOR) 40 MG tablet TAKE 1 TABLET(40 MG) BY MOUTH DAILY   cholecalciferol (VITAMIN D3) 25 MCG (1000 UNIT) tablet Take 1,000 Units by mouth daily.   glucose blood (FREESTYLE TEST STRIPS) test strip Use as instructed   glucose monitoring kit (FREESTYLE) monitoring kit USE TO TEST BLOOD GLUCOSE DAILY.   ibuprofen (ADVIL) 200 MG tablet Take 200 mg by mouth 3 (three) times daily.   Lancets (FREESTYLE) lancets Use as instructed   lisinopril (ZESTRIL) 40 MG tablet Take 1 tablet (40 mg total) by mouth daily.   metFORMIN (GLUCOPHAGE) 500 MG tablet TAKE 1/2 TABLET(250 MG) BY MOUTH AT BEDTIME   omeprazole (PRILOSEC) 20 MG capsule TAKE 1 CAPSULE(20 MG) BY MOUTH DAILY   propranolol ER (INDERAL LA) 120 MG 24 hr capsule TAKE 1 CAPSULE(120 MG) BY MOUTH DAILY   No facility-administered encounter medications on file as of 05/12/2021.    Allergies (verified) Patient has no known allergies.   History: Past Medical History:  Diagnosis Date   ANEMIA-NOS 12/06/2008   BACK PAIN 02/20/2010   DIABETES MELLITUS, TYPE II  06/01/2008   GERD 06/01/2008   HYPERLIPIDEMIA 06/01/2008   HYPERTENSION 06/01/2008   Tremors of nervous system    pt states he has had tremors most of his life in his hands/hereditary   Past Surgical History:  Procedure Laterality Date   COLONOSCOPY     ELBOW BURSA SURGERY     right elbow   ELBOW BURSA SURGERY Left 11/12/2018   Pt had left olecranon bursectomy   HAND SURGERY     2 surgeries on right hand   HAND SURGERY     left hand/removed a bone   Family History  Problem Relation Age of Onset   Diabetes Sister    Alzheimer's disease Sister    Diabetes Brother    Heart disease Mother    Sudden death Father        Suicide after his wife passed away    Colon cancer Neg Hx    Social History   Socioeconomic History   Marital status: Married    Spouse name: Not on file   Number of children: Not on file   Years of education: Not on file   Highest education level: Not on file  Occupational History   Not on file  Tobacco Use   Smoking status: Every Day    Packs/day: 2.00    Years: 35.00    Pack years: 70.00    Types: Cigarettes   Smokeless tobacco: Never     Tobacco comments:    down to 1.5 ppd X8 mos.  plans to quit on upcoming birthday  Vaping Use   Vaping Use: Never used  Substance and Sexual Activity   Alcohol use: Yes    Alcohol/week: 2.0 - 3.0 standard drinks    Types: 2 - 3 Standard drinks or equivalent per week   Drug use: No   Sexual activity: Not on file  Other Topics Concern   Not on file  Social History Narrative   Not on file   Social Determinants of Health   Financial Resource Strain: Low Risk    Difficulty of Paying Living Expenses: Not hard at all  Food Insecurity: No Food Insecurity   Worried About Running Out of Food in the Last Year: Never true   Ran Out of Food in the Last Year: Never true  Transportation Needs: No Transportation Needs   Lack of Transportation (Medical): No   Lack of Transportation (Non-Medical): No  Physical Activity:  Sufficiently Active   Days of Exercise per Week: 5 days   Minutes of Exercise per Session: 50 min  Stress: No Stress Concern Present   Feeling of Stress : Not at all  Social Connections: Moderately Isolated   Frequency of Communication with Friends and Family: More than three times a week   Frequency of Social Gatherings with Friends and Family: More than three times a week   Attends Religious Services: Never   Active Member of Clubs or Organizations: No   Attends Club or Organization Meetings: Never   Marital Status: Married    Tobacco Counseling Ready to quit: Yes Counseling given: Yes Tobacco comments: down to 1.5 ppd X8 mos.  plans to quit on upcoming birthday   Clinical Intake:  Pre-visit preparation completed: Yes     Diabetes: Yes CBG done?: No CBG resulted in Enter/ Edit results?: No Did pt. bring in CBG monitor from home?: No  How often do you need to have someone help you when you read instructions, pamphlets, or other written materials from your doctor or pharmacy?: 1 - Never  Diabetic? Yes   Diet: Regular  Interpreter Needed?: No  Nutrition Risk Assessment:  Has the patient had any N/V/D within the last 2 months?  No  Does the patient have any non-healing wounds?  No  Has the patient had any unintentional weight loss or weight gain?  No   Diabetes:  Is the patient diabetic?  Yes  If diabetic, was a CBG obtained today?  No  Did the patient bring in their glucometer from home?  No  How often do you monitor your CBG's? Patient stated on occasions.  Financial Strains and Diabetes Management:  Are you having any financial strains with the device, your supplies or your medication? No .  Does the patient want to be seen by Chronic Care Management for management of their diabetes?  No  Would the patient like to be referred to a Nutritionist or for Diabetic Management?  No   Diabetic Exams:  Diabetic Eye Exam: Completed Yes. Diabetic Foot Exam: Completed  No.   Activities of Daily Living In your present state of health, do you have any difficulty performing the following activities: 05/12/2021  Hearing? Y  Comment Wears hearing aids  Vision? N  Comment Wears reading glasses  Difficulty concentrating or making decisions? N  Walking or climbing stairs? N  Dressing or bathing? N  Doing errands, shopping? N  Preparing Food and eating ? N  Using   the Toilet? N  In the past six months, have you accidently leaked urine? N  Do you have problems with loss of bowel control? N  Managing your Medications? N  Managing your Finances? N  Housekeeping or managing your Housekeeping? N  Some recent data might be hidden    Patient Care Team: Nafziger, [name], NP as PCP - General (Family Medicine) Ortmann, Fred, MD as Consulting Physician (Orthopedic Surgery) Pryor, Madeline G, RPH as Pharmacist (Pharmacist)  Indicate any recent Medical Services you may have received from other than [facility] providers in the past year (date may be approximate).     Assessment:   This is a routine wellness examination for [place].  Hearing/Vision screen Hearing Screening - Comments:: Wears hearing aids. Vision Screening - Comments:: Wears reading glasses. Followed by [facility].  Dietary issues and exercise activities discussed: Current Exercise Habits: Home exercise routine, Type of exercise: stretching;walking, Time (Minutes): 40, Frequency (Times/Week): 5, Weekly Exercise (Minutes/Week): 200, Intensity: Moderate   Goals Addressed             This Visit's Progress    Exercise 3x per week (30 min per time)         Depression Screen PHQ 2/9 Scores 05/12/2021 05/06/2020 09/24/2019 07/16/2018 01/14/2018 03/22/2014  PHQ - 2 Score 0 0 0 0 2 0  PHQ- 9 Score - 0 - - 8 -    Fall Risk Fall Risk  05/12/2021 05/06/2020 03/23/2020 07/16/2018  Falls in the past year? 0 0 0 0  Number falls in past yr: 0 0 0 -  Injury with Fall? 0 0 0 -  Risk for fall due to : - - No Fall  Risks -  Follow up - - Falls evaluation completed -    FALL RISK PREVENTION PERTAINING TO THE HOME:  Any stairs in or around the home? No  If so, are there any without handrails? No  Home free of loose throw rugs in walkways, pet beds, electrical cords, etc? Yes  Adequate lighting in your home to reduce risk of falls? Yes   ASSISTIVE DEVICES UTILIZED TO PREVENT FALLS:  Life alert? No  Use of a cane, walker or w/c? No  Grab bars in the bathroom? Yes  Shower chair or bench in shower? Yes  Elevated toilet seat or a handicapped toilet? Yes   TIMED UP AND GO:  Was the test performed? Yes .  Length of time to ambulate 10 feet: 5 sec.   Gait steady and fast without use of assistive device  Cognitive Function:     6CIT Screen 05/12/2021  What Year? 0 points  What month? 0 points  What time? 0 points  Count back from 20 0 points  Months in reverse 0 points  Repeat phrase 0 points  Total Score 0    Immunizations Immunization History  Administered Date(s) Administered   Fluad Quad(high Dose 65+) 06/23/2020, 03/30/2021   Influenza, Seasonal, Injecte, Preservative Fre 03/10/2019   Influenza,inj,Quad PF,6+ Mos 04/05/2015, 05/01/2016, 05/16/2018   Influenza-Unspecified 04/22/2017   PFIZER(Purple Top)SARS-COV-2 Vaccination 09/03/2019, 09/30/2019, 06/08/2020   Pneumococcal Conjugate-13 07/16/2018   Pneumococcal Polysaccharide-23 05/30/2017   Td 07/02/2005   Zoster Recombinat (Shingrix) 06/19/2018, 11/10/2018      Screening Tests Health Maintenance  Topic Date Due   OPHTHALMOLOGY EXAM  04/22/2012   COVID-19 Vaccine (4 - Booster for Pfizer series) 08/03/2020   COLONOSCOPY (Pts 45-49yrs Insurance coverage will need to be confirmed)  07/09/2021   FOOT EXAM  09/27/2021     HEMOGLOBIN A1C  09/27/2021   Pneumonia Vaccine 65+ Years old (3 - PPSV23 if available, else PCV20) 05/30/2022   INFLUENZA VACCINE  Completed   Hepatitis C Screening  Completed   Zoster Vaccines- Shingrix   Completed   HPV VACCINES  Aged Out    Health Maintenance  Health Maintenance Due  Topic Date Due   OPHTHALMOLOGY EXAM  04/22/2012   COVID-19 Vaccine (4 - Booster for Pfizer series) 08/03/2020   Vision Screening: Recommended annual ophthalmology exams for early detection of glaucoma and other disorders of the eye. Is the patient up to date with their annual eye exam?  Yes  Who is the provider or what is the name of the office in which the patient attends annual eye exams? Yes. Followed by [facility]. .   Dental Screening: Recommended annual dental exams for proper oral hygiene  Community Resource Referral / Chronic Care Management:  CRR required this visit?  No   CCM required this visit?  No     Plan:     I have personally reviewed and noted the following in the patient's chart:   Medical and social history Use of alcohol, tobacco or illicit drugs  Current medications and supplements including opioid prescriptions. Patient is not currently taking opioid prescriptions. Functional ability and status Nutritional status Physical activity Advanced directives List of other physicians Hospitalizations, surgeries, and ER visits in previous 12 months Vitals Screenings to include cognitive, depression, and falls Referrals and appointments  In addition, I have reviewed and discussed with patient certain preventive protocols, quality metrics, and best practice recommendations. A written personalized care plan for preventive services as well as general preventive health recommendations were provided to patient.     Beverly W Wilson, LPN   05/12/2021      

## 2021-05-12 NOTE — Patient Instructions (Addendum)
Richard [name] , Thank you for taking time to come for your Medicare Wellness Visit. I appreciate your ongoing commitment to your health goals. Please review the following plan we discussed and let me know if I can assist you in the future.   These are the goals we discussed:  Goals      Exercise 3x per week (30 min per time)     Quit Smoking        This is a list of the screening recommended for you and due dates:  Health Maintenance  Topic Date Due   Eye exam for diabetics  04/22/2012   COVID-19 Vaccine (4 - Booster for Pfizer series) 08/03/2020   Colon Cancer Screening  07/09/2021   Complete foot exam   09/27/2021   Hemoglobin A1C  09/27/2021   Pneumonia Vaccine (3 - PPSV23 if available, else PCV20) 05/30/2022   Flu Shot  Completed   Hepatitis C Screening: USPSTF Recommendation to screen - Ages 18-79 yo.  Completed   Zoster (Shingles) Vaccine  Completed   HPV Vaccine  Aged Out    Advanced directives: No  Conditions/risks identified: None  Next appointment: Follow up in one year for your annual wellness visit.Preventive Care 25 Years and Older, Male Preventive care refers to lifestyle choices and visits with your health care provider that can promote health and wellness. What does preventive care include? A yearly physical exam. This is also called an annual well check. Dental exams once or twice a year. Routine eye exams. Ask your health care provider how often you should have your eyes checked. Personal lifestyle choices, including: Daily care of your teeth and gums. Regular physical activity. Eating a healthy diet. Avoiding tobacco and drug use. Limiting alcohol use. Practicing safe sex. Taking low doses of aspirin every day. Taking vitamin and mineral supplements as recommended by your health care provider. What happens during an annual well check? The services and screenings done by your health care provider during your annual well check will depend on your age,  overall health, lifestyle risk factors, and family history of disease. Counseling  Your health care provider may ask you questions about your: Alcohol use. Tobacco use. Drug use. Emotional well-being. Home and relationship well-being. Sexual activity. Eating habits. History of falls. Memory and ability to understand (cognition). Work and work Statistician. Screening  You may have the following tests or measurements: Height, weight, and BMI. Blood pressure. Lipid and cholesterol levels. These may be checked every 5 years, or more frequently if you are over 64 years old. Skin check. Lung cancer screening. You may have this screening every year starting at age 86 if you have a 30-pack-year history of smoking and currently smoke or have quit within the past 15 years. Fecal occult blood test (FOBT) of the stool. You may have this test every year starting at age 85. Flexible sigmoidoscopy or colonoscopy. You may have a sigmoidoscopy every 5 years or a colonoscopy every 10 years starting at age 60. Prostate cancer screening. Recommendations will vary depending on your family history and other risks. Hepatitis C blood test. Hepatitis B blood test. Sexually transmitted disease (STD) testing. Diabetes screening. This is done by checking your blood sugar (glucose) after you have not eaten for a while (fasting). You may have this done every 1-3 years. Abdominal aortic aneurysm (AAA) screening. You may need this if you are a current or former smoker. Osteoporosis. You may be screened starting at age 33 if you are at high  risk. Talk with your health care provider about your test results, treatment options, and if necessary, the need for more tests. Vaccines  Your health care provider may recommend certain vaccines, such as: Influenza vaccine. This is recommended every year. Tetanus, diphtheria, and acellular pertussis (Tdap, Td) vaccine. You may need a Td booster every 10 years. Zoster vaccine.  You may need this after age 7. Pneumococcal 13-valent conjugate (PCV13) vaccine. One dose is recommended after age 10. Pneumococcal polysaccharide (PPSV23) vaccine. One dose is recommended after age 14. Talk to your health care provider about which screenings and vaccines you need and how often you need them. This information is not intended to replace advice given to you by your health care provider. Make sure you discuss any questions you have with your health care provider. Document Released: 07/15/2015 Document Revised: 03/07/2016 Document Reviewed: 04/19/2015 Elsevier Interactive Patient Education  2017 [place] Prevention in the Home Falls can cause injuries. They can happen to people of all ages. There are many things you can do to make your home safe and to help prevent falls. What can I do on the outside of my home? Regularly fix the edges of walkways and driveways and fix any cracks. Remove anything that might make you trip as you walk through a door, such as a raised step or threshold. Trim any bushes or trees on the path to your home. Use bright outdoor lighting. Clear any walking paths of anything that might make someone trip, such as rocks or tools. Regularly check to see if handrails are loose or broken. Make sure that both sides of any steps have handrails. Any raised decks and porches should have guardrails on the edges. Have any leaves, snow, or ice cleared regularly. Use sand or salt on walking paths during winter. Clean up any spills in your garage right away. This includes oil or grease spills. What can I do in the bathroom? Use night lights. Install grab bars by the toilet and in the tub and shower. Do not use towel bars as grab bars. Use non-skid mats or decals in the tub or shower. If you need to sit down in the shower, use a plastic, non-slip stool. Keep the floor dry. Clean up any water that spills on the floor as soon as it happens. Remove soap  buildup in the tub or shower regularly. Attach bath mats securely with double-sided non-slip rug tape. Do not have throw rugs and other things on the floor that can make you trip. What can I do in the bedroom? Use night lights. Make sure that you have a light by your bed that is easy to reach. Do not use any sheets or blankets that are too big for your bed. They should not hang down onto the floor. Have a firm chair that has side arms. You can use this for support while you get dressed. Do not have throw rugs and other things on the floor that can make you trip. What can I do in the kitchen? Clean up any spills right away. Avoid walking on wet floors. Keep items that you use a lot in easy-to-reach places. If you need to reach something above you, use a strong step stool that has a grab bar. Keep electrical cords out of the way. Do not use floor polish or wax that makes floors slippery. If you must use wax, use non-skid floor wax. Do not have throw rugs and other things on the floor that can  make you trip. What can I do with my stairs? Do not leave any items on the stairs. Make sure that there are handrails on both sides of the stairs and use them. Fix handrails that are broken or loose. Make sure that handrails are as long as the stairways. Check any carpeting to make sure that it is firmly attached to the stairs. Fix any carpet that is loose or worn. Avoid having throw rugs at the top or bottom of the stairs. If you do have throw rugs, attach them to the floor with carpet tape. Make sure that you have a light switch at the top of the stairs and the bottom of the stairs. If you do not have them, ask someone to add them for you. What else can I do to help prevent falls? Wear shoes that: Do not have high heels. Have rubber bottoms. Are comfortable and fit you well. Are closed at the toe. Do not wear sandals. If you use a stepladder: Make sure that it is fully opened. Do not climb a closed  stepladder. Make sure that both sides of the stepladder are locked into place. Ask someone to hold it for you, if possible. Clearly mark and make sure that you can see: Any grab bars or handrails. First and last steps. Where the edge of each step is. Use tools that help you move around (mobility aids) if they are needed. These include: Canes. Walkers. Scooters. Crutches. Turn on the lights when you go into a dark area. Replace any light bulbs as soon as they burn out. Set up your furniture so you have a clear path. Avoid moving your furniture around. If any of your floors are uneven, fix them. If there are any pets around you, be aware of where they are. Review your medicines with your doctor. Some medicines can make you feel dizzy. This can increase your chance of falling. Ask your doctor what other things that you can do to help prevent falls. This information is not intended to replace advice given to you by your health care provider. Make sure you discuss any questions you have with your health care provider. Document Released: 04/14/2009 Document Revised: 11/24/2015 Document Reviewed: 07/23/2014 Elsevier Interactive Patient Education  2017 [employer].

## 2021-07-31 ENCOUNTER — Other Ambulatory Visit: Payer: Self-pay | Admitting: Adult Health

## 2021-07-31 DIAGNOSIS — E1129 Type 2 diabetes mellitus with other diabetic kidney complication: Secondary | ICD-10-CM

## 2021-08-24 ENCOUNTER — Other Ambulatory Visit: Payer: Self-pay | Admitting: Adult Health

## 2021-08-24 DIAGNOSIS — G250 Essential tremor: Secondary | ICD-10-CM

## 2021-09-04 ENCOUNTER — Telehealth: Payer: Self-pay | Admitting: Pharmacist

## 2021-09-04 NOTE — Chronic Care Management (AMB) (Signed)
? ? ?  Chronic Care Management ?Pharmacy Assistant  ? ?Name: Richard [name]  MRN: [id number] DOB: 17-Mar-1951 ? ?09/06/2021 APPOINTMENT REMINDER ? ?[name] was reminded to have all medications, supplements and any blood glucose and blood pressure readings available for review with Jeni Salles, Pharm. D, at his telephone visit on 09/06/2021 at 2:30. ? ? ?Questions: ?Have you had any recent office visit or specialist visit outside of [place]? Patient denies any recent visits.  ? ?Are there any concerns you would like to discuss during your office visit? Patient denies any concerns at this time ? ?Are you having any problems obtaining your medications? (Whether it pharmacy issues or cost) Patient denies any issues getting medications.  ? ?If patient has any PAP medications ask if they are having any problems getting their PAP medication or refill? Patient denies any pap medications.  ? ?Care Gaps: ?AWV - scheduled for 05/14/2022 ?Last BP - 140/80 on 03/30/2021 ?Last A1C - 5.4 on 03/30/2021 ?Ophthalmology exam - overdue ?Covid booster - overdue ?Colonoscopy - overdue ?  ?Star Rating Drugs: ?Atorvastatin '40mg'$  - last filled on 07/23/2021 90DS at [facility] ?Lisinopril '40mg'$  - last filled on 08/01/2021 90DS at [facility] ?Metformin '500mg'$  - last filled on 08/01/2021 90DS at [facility] ? ? ?Any gaps in medications fill history? No ? ?Gennie Alma CMA  ?Clinical Pharmacist Assistant ?[phone] ? ? ?

## 2021-09-06 ENCOUNTER — Ambulatory Visit (INDEPENDENT_AMBULATORY_CARE_PROVIDER_SITE_OTHER): Payer: Medicare (Managed Care) | Admitting: Pharmacist

## 2021-09-06 DIAGNOSIS — I10 Essential (primary) hypertension: Secondary | ICD-10-CM

## 2021-09-06 DIAGNOSIS — E1129 Type 2 diabetes mellitus with other diabetic kidney complication: Secondary | ICD-10-CM

## 2021-09-06 NOTE — Progress Notes (Signed)
Chronic Care Management Pharmacy Note  09/06/2021 Name:  Richard [name] MRN:  [id number] DOB:  Aug 25, 1950  Summary: A1c at goal < 7% BP not ideally at goal < 130/80 per office readings Pt is motivated to quit smoking and is wearing nicotine patch  Recommendations/Changes made from today's visit: -Recommended stopping metformin based on A1c -Recommended monitoring BP at home once a week -Recommended bringing BP cuff to next office visit to ensure accuracy  Plan: BP/Smoking assessment in 2-3 months  Subjective: Richard [name] is an 71 y.o. year old male who is a primary patient of [name], NP.  The CCM team was consulted for assistance with disease management and care coordination needs.    Engaged with patient by telephone for follow up visit in response to provider referral for pharmacy case management and/or care coordination services.   Consent to Services:  The patient was given information about Chronic Care Management services, agreed to services, and gave verbal consent prior to initiation of services.  Please see initial visit note for detailed documentation.   Patient Care Team: [name], NP as PCP - General (Family Medicine) Iran Planas, MD as Consulting Physician (Orthopedic Surgery) Viona Gilmore, [facility] as Pharmacist (Pharmacist)  Recent office visits: 05/12/21 [name], LPN: Patient presented for AWV.  03/30/21 [name], NP: Patient presented for chronic conditions follow up. No medication changes. Administered influenza vaccine.  Recent consult visits: 06/01/20 Latanya Maudlin ([facility]): Patient presented for heel pain follow up. Unable to access notes.  05/04/2019- Pulmonology- Patient presented to Dr. Kara Mead, MD for IPF. Patient is a heavy smoker and has OSA, emphysema and ILD. Discussed smoking cessation. Patient has used Chantix in the past; does not want to use nicotine replacement. Patient stopped using CPAP, patient was  advised weight gain and to avoid sleeping in supine position.   Hospital visits: None in previous 6 months  Objective:  Lab Results  Component Value Date   CREATININE 1.36 09/27/2020   BUN 15 09/27/2020   GFR 53.93 (L) 09/27/2020   GFRNONAA 69.80 11/30/2009   NA 138 09/27/2020   K 4.5 09/27/2020   CALCIUM 9.6 09/27/2020   CO2 28 09/27/2020    Lab Results  Component Value Date/Time   HGBA1C 5.4 03/30/2021 07:05 AM   HGBA1C 5.1 09/27/2020 08:23 AM   HGBA1C 5.5 06/23/2020 08:23 AM   HGBA1C 5.7 09/25/2019 08:33 AM   HGBA1C 5.3 05/16/2018 07:53 AM   GFR 53.93 (L) 09/27/2020 08:23 AM   GFR 51.08 (L) 09/25/2019 08:33 AM   MICROALBUR 5.3 (H) 05/30/2017 09:10 AM   MICROALBUR 5.7 (H) 04/27/2016 09:00 AM    Last diabetic Eye exam:  Lab Results  Component Value Date/Time   HMDIABEYEEXA no diab. retinopathy 04/23/2011 12:00 AM    Last diabetic Foot exam:  Lab Results  Component Value Date/Time   HMDIABFOOTEX yes 12/12/2009 12:00 AM     Lab Results  Component Value Date   CHOL 124 09/27/2020   HDL 49.40 09/27/2020   LDLCALC 60 09/27/2020   TRIG 72.0 09/27/2020   CHOLHDL 3 09/27/2020    Hepatic Function Latest Ref Rng & Units 09/27/2020 09/25/2019 07/16/2018  Total Protein 6.0 - 8.3 g/dL 7.0 7.2 7.2  Albumin 3.5 - 5.2 g/dL 4.2 4.5 4.4  AST 0 - 37 U/L _0 ALT 0 - 53 U/L _1 Alk Phosphatase 39 - 117 U/L 82 79 74  Total Bilirubin 0.2 - 1.2 mg/dL  0.7 1.0 0.8  Bilirubin, Direct 0.0 - 0.3 mg/dL - - -    Lab Results  Component Value Date/Time   TSH 1.08 09/27/2020 08:23 AM   TSH 1.04 09/25/2019 08:33 AM    CBC Latest Ref Rng & Units 09/27/2020 09/25/2019 07/16/2018  WBC 4.0 - 10.5 K/uL 5.0 6.9 7.3  Hemoglobin 13.0 - 17.0 g/dL 15.9 15.5 14.9  Hematocrit 39.0 - 52.0 % 45.5 43.6 42.7  Platelets 150.0 - 400.0 K/uL 194.0 210.0 226.0    Lab Results  Component Value Date/Time   VD25OH 61.84 09/27/2020 08:23 AM    Clinical ASCVD: No  The ASCVD Risk score  (Arnett DK, et al., 2019) failed to calculate for the following reasons:   The valid total cholesterol range is 130 to 320 mg/dL    Depression screen [facility] 2/9 05/12/2021 05/06/2020 09/24/2019  Decreased Interest 0 0 0  Down, Depressed, Hopeless 0 0 0  PHQ - 2 Score 0 0 0  Altered sleeping - 0 -  Tired, decreased energy - 0 -  Change in appetite - 0 -  Feeling bad or failure about yourself  - 0 -  Trouble concentrating - 0 -  Moving slowly or fidgety/restless - 0 -  Suicidal thoughts - 0 -  PHQ-9 Score - 0 -  Difficult doing work/chores - Not difficult at all -      Social History   Tobacco Use  Smoking Status Every Day   Packs/day: 2.00   Years: 35.00   Pack years: 70.00   Types: Cigarettes  Smokeless Tobacco Never  Tobacco Comments   down to 1.5 ppd X8 mos.  plans to quit on upcoming birthday   BP Readings from Last 3 Encounters:  05/12/21 128/68  03/30/21 140/80  09/27/20 128/80   Pulse Readings from Last 3 Encounters:  05/12/21 62  03/30/21 74  06/23/20 75   Wt Readings from Last 3 Encounters:  05/12/21 191 lb (86.6 kg)  03/30/21 191 lb (86.6 kg)  09/27/20 190 lb 2 oz (86.2 kg)    Assessment/Interventions: Review of patient past medical history, allergies, medications, health status, including review of consultants reports, laboratory and other test data, was performed as part of comprehensive evaluation and provision of chronic care management services.   SDOH:  (Social Determinants of Health) assessments and interventions performed: No   CCM Care Plan  No Known Allergies  Medications Reviewed Today     Reviewed by Viona Gilmore, [facility] (Pharmacist) on 09/06/21 at 1444  Med List Status: <None>   Medication Order Taking? Sig Documenting Provider Last Dose Status Informant  amLODipine (NORVASC) 5 MG tablet [id number]  Take daily Nafziger, Tommi Rumps, NP  Active   Ascorbic Acid (VITAMIN C) 1000 MG tablet [id number]  Take 1,000 mg by mouth daily. [provider]  Active Self  aspirin EC 81 MG tablet [id number]  Take 81 mg by mouth daily. [provider]  Active Self  atorvastatin (LIPITOR) 40 MG tablet [id number]  TAKE 1 TABLET(40 MG) BY MOUTH DAILY Nafziger, Tommi Rumps, NP  Active   cholecalciferol (VITAMIN D3) 25 MCG (1000 UNIT) tablet [id number]  Take 1,000 Units by mouth daily. [provider]  Active Self  glucose blood (FREESTYLE TEST STRIPS) test strip [id number]  Use as instructed Nafziger, Tommi Rumps, NP  Active   glucose monitoring kit (FREESTYLE) monitoring kit [id number]  USE TO TEST BLOOD GLUCOSE DAILY. Nafziger, Tommi Rumps, NP  Active   ibuprofen (ADVIL) 200 MG tablet [id number]  Take 200  mg by mouth 3 (three) times daily. [provider]  Active Self  Lancets (FREESTYLE) lancets [id number]  Use as instructed Nafziger, Tommi Rumps, NP  Active   lisinopril (ZESTRIL) 40 MG tablet [id number]  Take 1 tablet (40 mg total) by mouth daily. Nafziger, Tommi Rumps, NP  Active   metFORMIN (GLUCOPHAGE) 500 MG tablet [id number]  TAKE 1/2 TABLET(250 MG) BY MOUTH AT BEDTIME Nafziger, Tommi Rumps, NP  Active   nicotine (NICODERM CQ - DOSED IN MG/24 HOURS) 21 mg/24hr patch [id number] Yes Place 21 mg onto the skin daily. [provider] Taking Active   omeprazole (PRILOSEC) 20 MG capsule [id number]  TAKE 1 CAPSULE(20 MG) BY MOUTH DAILY Nafziger, Tommi Rumps, NP  Active   propranolol ER (INDERAL LA) 120 MG 24 hr capsule [id number]  TAKE 1 CAPSULE(120 MG) BY MOUTH DAILY [name], NP  Active             Patient Active Problem List   Diagnosis Date Noted   IPF (idiopathic pulmonary fibrosis) ([place]) 07/23/2018   Pulmonary nodule 07/23/2018   OSA (obstructive sleep apnea) 06/18/2018   Alcohol abuse 10/31/2016   Chronic renal insufficiency, stage II (mild) 03/22/2014   Benign familial tremor 03/22/2014   Macrocytosis without anemia 12/31/2011   Tobacco use 12/31/2011   BACK PAIN 02/20/2010   Diabetes mellitus with renal complications ([place]) 62/83/1517    Dyslipidemia 06/01/2008   Essential hypertension 06/01/2008   GERD 06/01/2008    Immunization History  Administered Date(s) Administered   Fluad Quad(high Dose 65+) 06/23/2020, 03/30/2021   Influenza, Seasonal, Injecte, Preservative Fre 03/10/2019   Influenza,inj,Quad PF,6+ Mos 04/05/2015, 05/01/2016, 05/16/2018   Influenza-Unspecified 04/22/2017   PFIZER(Purple Top)SARS-COV-2 Vaccination 09/03/2019, 09/30/2019, 06/08/2020   Pneumococcal Conjugate-13 07/16/2018   Pneumococcal Polysaccharide-23 05/30/2017   Td 07/02/2005   Zoster Recombinat (Shingrix) 06/19/2018, 11/10/2018    Conditions to be addressed/monitored:  Hypertension, Hyperlipidemia, Diabetes, GERD, Chronic Kidney Disease, Tobacco use and IPF  Conditions addressed this visit: Diabetes, hypertension, tobacco use  Care Plan : [place]  Updates made by Viona Gilmore, [place] since 09/06/2021 12:00 AM     Problem: Problem: Hypertension, Hyperlipidemia, Diabetes, GERD, Chronic Kidney Disease, Tobacco use and IPF      Long-Range Goal: Patient-Specific Goal   Start Date: 09/08/2020  Expected End Date: 09/08/2020  Recent Progress: On track  Priority: High  Note:   Current Barriers:  Unable to independently monitor therapeutic efficacy  Pharmacist Clinical Goal(s):  Patient will achieve adherence to monitoring guidelines and medication adherence to achieve therapeutic efficacy through collaboration with PharmD and provider.   Interventions: 1:1 collaboration with [name], NP regarding development and update of comprehensive plan of care as evidenced by provider attestation and co-signature Inter-disciplinary care team collaboration (see longitudinal plan of care) Comprehensive medication review performed; medication list updated in electronic medical record  Hypertension (BP goal <140/90) -Controlled -Current treatment: amlodipine 57m, 1 tablet daily - Appropriate, Effective, Safe,  Accessible lisinopril 468m 1 tablet daily - Appropriate, Effective, Safe, Accessible -Medications previously tried: none  -Current home readings: 128/80 (last checked a month) - arm cuff -Current dietary habits: patient is including more vegetables and minimizing salt intake -Current exercise habits: did not discuss -Denies hypotensive/hypertensive symptoms -Educated on Exercise goal of 150 minutes per week; Importance of home blood pressure monitoring; Proper BP monitoring technique; -Counseled to monitor BP at home weekly, document, and provide log at future appointments -Counseled on diet and exercise extensively Recommended to continue current medication  Hyperlipidemia: (LDL goal <  70) -Controlled -Current treatment: atorvastatin 98m, 1 tablet daily - Appropriate, Effective, Safe, Accessible -Medications previously tried: simvastatin  -Current dietary patterns: using the air fryer and using olive oil instead of vegetable oil -Current exercise habits: did not discuss -Educated on Cholesterol goals;  Importance of limiting foods high in cholesterol; Exercise goal of 150 minutes per week; -Counseled on diet and exercise extensively Recommended to continue current medication  Diabetes (A1c goal <7%) -Controlled -Current medications: Metformin 500 mg 1/2 tablet daily - Appropriate, Effective, Query Safe, Accessible -Medications previously tried: none  -Current home glucose readings fasting glucose: 134 (a few days ago; very seldom now) post prandial glucose: does not check -Denies hypoglycemic/hyperglycemic symptoms -Current exercise: stays on the move constantly but no structured exercise -Educated on Exercise goal of 150 minutes per week; Carbohydrate counting and/or plate method -Counseled to check feet daily and get yearly eye exams -Counseled on diet and exercise extensively Recommended to continue current medication Recommended stopping metformin.  Tobacco use  (Goal quit smoking) -Uncontrolled -Previous quit attempts: bupropion (Wellbutrin SR), Chantix; patient states he has quit multiple times in the past, but eventually goes back to smoking. Longest he has ever quit was for 15 years -Current treatment  Nicotine 21 mg 1 patch daily - Appropriate, Effective, Safe, Accessible -Patient smokes Within 30 minutes of waking -Patient triggers include:  habit -On a scale of 1-10, reports MOTIVATION to quit is 10 -On a scale of 1-10, reports CONFIDENCE in quitting is 10 - Educated on the availability of the quit line to provide free resources -Recommended using all of the steps of the nicotine patch prior to quitting.  GERD (Goal: minimize symptoms) -Controlled -Current treatment  omeprazole 26m 1 capsule every other day - Appropriate, Effective, Query Safe, Accessible -Medications previously tried: none  -Counseled on long term risks of taking PPIs and patient was happy with taking it every other day   Leg/back pain (Goal: minimize pain) -Controlled -Current treatment  Ibuprofen 20043m1 tablet three times daily - Appropriate, Effective, Query Safe, Accessible  -Medications previously tried: none  -Counseled on risks of taking NSAIDs due to kidney function and patient reports this is the only thing that helps  Tremor (Goal: minimize symptoms) -Uncontrolled -Current treatment  Propranolol 60 mg 1 tablet daily - Appropriate, Query effective, Safe, Accessible -Medications previously tried: none  -Counseled on the importance of checking blood pressure as this can also affect it   Health Maintenance -Vaccine gaps: tetanus -Current therapy:  Aspirin 1 tablet daily Vitamin D 1000 units 1 tablet daily Vitamin B12 1000 mcg 1 tablet Vitamin C 1000 mg 1 tablet -Educated on Cost vs benefit of each product must be carefully weighed by individual consumer -Patient is satisfied with current therapy and denies issues -Recommended to continue current  medication  Patient Goals/Self-Care Activities Patient will:  - take medications as prescribed check blood pressure weekly, document, and provide at future appointments  Follow Up Plan: Telephone follow up appointment with care management team member scheduled for: 1 year      Medication Assistance: None required.  Patient affirms current coverage meets needs.  Compliance/Adherence/Medication fill history: Care Gaps: Eye exam, COVID booster, colonoscopy Last BP - 140/80 on 03/30/2021 Last A1C - 5.4 on 03/30/2021  Star-Rating Drugs: Atorvastatin 26m77mlast filled on 07/23/2021 90DS at [facility] Lisinopril 26mg68mast filled on 08/01/2021 90DS at [facility] Metformin 500mg 63mst [facility]01[facility]20[facility]  [id number]  [place], [place] [address] [place] [address] Phone: [phone] Fax: [phone]  [facility] ([place], [place] [facility] [address] [place] [address] [place] [address] Phone: [phone] Fax: [phone]  Uses pill box? Yes Pt endorses 100% compliance  We discussed: Current pharmacy is preferred with insurance plan and patient is satisfied with pharmacy services Patient decided to: Continue current medication management strategy  Care Plan and Follow Up Patient Decision:  Patient agrees to Care Plan and Follow-up.  Plan: Telephone follow up appointment with care management team member scheduled for:  1 year  Jeni Salles, PharmD [place] [facility] [place] [phone]   1 year out

## 2021-09-29 ENCOUNTER — Encounter: Payer: Self-pay | Admitting: Adult Health

## 2021-09-29 ENCOUNTER — Ambulatory Visit (INDEPENDENT_AMBULATORY_CARE_PROVIDER_SITE_OTHER): Payer: Medicare (Managed Care) | Admitting: Adult Health

## 2021-09-29 VITALS — BP 150/80 | HR 87 | Temp 97.8°F | Ht 67.0 in | Wt 194.0 lb

## 2021-09-29 DIAGNOSIS — E785 Hyperlipidemia, unspecified: Secondary | ICD-10-CM

## 2021-09-29 DIAGNOSIS — J988 Other specified respiratory disorders: Secondary | ICD-10-CM

## 2021-09-29 DIAGNOSIS — E1129 Type 2 diabetes mellitus with other diabetic kidney complication: Secondary | ICD-10-CM

## 2021-09-29 DIAGNOSIS — G250 Essential tremor: Secondary | ICD-10-CM

## 2021-09-29 DIAGNOSIS — I10 Essential (primary) hypertension: Secondary | ICD-10-CM | POA: Diagnosis not present

## 2021-09-29 DIAGNOSIS — K2100 Gastro-esophageal reflux disease with esophagitis, without bleeding: Secondary | ICD-10-CM

## 2021-09-29 DIAGNOSIS — K4090 Unilateral inguinal hernia, without obstruction or gangrene, not specified as recurrent: Secondary | ICD-10-CM

## 2021-09-29 DIAGNOSIS — Z125 Encounter for screening for malignant neoplasm of prostate: Secondary | ICD-10-CM

## 2021-09-29 DIAGNOSIS — Z1211 Encounter for screening for malignant neoplasm of colon: Secondary | ICD-10-CM

## 2021-09-29 DIAGNOSIS — Z720 Tobacco use: Secondary | ICD-10-CM

## 2021-09-29 DIAGNOSIS — Z0000 Encounter for general adult medical examination without abnormal findings: Secondary | ICD-10-CM

## 2021-09-29 LAB — CBC WITH DIFFERENTIAL/PLATELET
Basophils Absolute: 0 10*3/uL (ref 0.0–0.1)
Basophils Relative: 0.6 % (ref 0.0–3.0)
Eosinophils Absolute: 0.2 10*3/uL (ref 0.0–0.7)
Eosinophils Relative: 5.1 % — ABNORMAL HIGH (ref 0.0–5.0)
HCT: 42.3 % (ref 39.0–52.0)
Hemoglobin: 14.7 g/dL (ref 13.0–17.0)
Lymphocytes Relative: 33.6 % (ref 12.0–46.0)
Lymphs Abs: 1.7 10*3/uL (ref 0.7–4.0)
MCHC: 34.7 g/dL (ref 30.0–36.0)
MCV: 106.4 fl — ABNORMAL HIGH (ref 78.0–100.0)
Monocytes Absolute: 0.4 10*3/uL (ref 0.1–1.0)
Monocytes Relative: 8.2 % (ref 3.0–12.0)
Neutro Abs: 2.6 10*3/uL (ref 1.4–7.7)
Neutrophils Relative %: 52.5 % (ref 43.0–77.0)
Platelets: 198 10*3/uL (ref 150.0–400.0)
RBC: 3.97 Mil/uL — ABNORMAL LOW (ref 4.22–5.81)
RDW: 13.8 % (ref 11.5–15.5)
WBC: 4.9 10*3/uL (ref 4.0–10.5)

## 2021-09-29 LAB — COMPREHENSIVE METABOLIC PANEL
ALT: 14 U/L (ref 0–53)
AST: 17 U/L (ref 0–37)
Albumin: 4.2 g/dL (ref 3.5–5.2)
Alkaline Phosphatase: 87 U/L (ref 39–117)
BUN: 9 mg/dL (ref 6–23)
CO2: 27 mEq/L (ref 19–32)
Calcium: 9.6 mg/dL (ref 8.4–10.5)
Chloride: 100 mEq/L (ref 96–112)
Creatinine, Ser: 1.48 mg/dL (ref 0.40–1.50)
GFR: 48.38 mL/min — ABNORMAL LOW (ref >60.00)
Glucose, Bld: 120 mg/dL — ABNORMAL HIGH (ref 70–99)
Potassium: 4.2 mEq/L (ref 3.5–5.1)
Sodium: 136 mEq/L (ref 135–145)
Total Bilirubin: 0.8 mg/dL (ref 0.2–1.2)
Total Protein: 6.8 g/dL (ref 6.0–8.3)

## 2021-09-29 LAB — LIPID PANEL
Cholesterol: 123 mg/dL (ref 0–200)
HDL: 37.7 mg/dL — ABNORMAL LOW (ref >39.00)
LDL Cholesterol: 69 mg/dL (ref 0–99)
NonHDL: 85.3
Total CHOL/HDL Ratio: 3
Triglycerides: 82 mg/dL (ref 0.0–149.0)
VLDL: 16.4 mg/dL (ref 0.0–40.0)

## 2021-09-29 LAB — PSA: PSA: 0.9 ng/mL (ref 0.10–4.00)

## 2021-09-29 LAB — HEMOGLOBIN A1C: Hgb A1c MFr Bld: 6 % (ref 4.6–6.5)

## 2021-09-29 LAB — TSH: TSH: 1.33 u[IU]/mL (ref 0.35–5.50)

## 2021-09-29 MED ORDER — LISINOPRIL 40 MG PO TABS: 40.0000 mg | ORAL_TABLET | Freq: Every day | ORAL | 3 refills | Status: DC

## 2021-09-29 MED ORDER — AMLODIPINE BESYLATE 5 MG PO TABS: ORAL_TABLET | ORAL | 3 refills | Status: DC

## 2021-09-29 MED ORDER — ATORVASTATIN CALCIUM 40 MG PO TABS: ORAL_TABLET | ORAL | 3 refills | Status: DC

## 2021-09-29 MED ORDER — OMEPRAZOLE 20 MG PO CPDR: DELAYED_RELEASE_CAPSULE | ORAL | 3 refills | Status: DC

## 2021-09-29 MED ORDER — AZITHROMYCIN 250 MG PO TABS: ORAL_TABLET | ORAL | 0 refills | Status: AC

## 2021-09-29 MED ORDER — PREDNISONE 10 MG PO TABS: 10.0000 mg | ORAL_TABLET | Freq: Every day | ORAL | 0 refills | Status: DC

## 2021-09-29 NOTE — Patient Instructions (Addendum)
It was great seeing you today  ? ?We will follow up with you regarding your lab work  ? ?Please let me know if you need anything  ? ?[facility]  ?Address: [place], [place], [place] [address] ?Phone: [phone] ? ?Follow up in 6 months for diabetes  ?

## 2021-09-29 NOTE — Progress Notes (Signed)
? ?Subjective:  ? ? Patient ID: Richard [name], male    DOB: [birth year], 71 y.o.   MRN: [id number] ? ?HPI ?Patient presents for yearly preventative medicine examination. He is a Richard 71 year old male who  has a past medical history of ANEMIA-NOS (12/06/2008), BACK PAIN (02/20/2010), DIABETES MELLITUS, TYPE II (06/01/2008), GERD (06/01/2008), HYPERLIPIDEMIA (06/01/2008), HYPERTENSION (06/01/2008), and Tremors of nervous system. ? ?DM Type 2 -metformin 250 mg daily.  His A1c has been well controlled.  He does try and stay active and eat a heart healthy diet ?Lab Results  ?Component Value Date  ? HGBA1C 5.4 03/30/2021  ? ?Hypertension -well-controlled with lisinopril 40 mg daily and Norvasc 5 mg daily.  He denies episodes of dizziness, lightheadedness, chest pain, or shortness of breath ?BP Readings from Last 3 Encounters:  ?09/29/21 (!) 150/80  ?05/12/21 128/68  ?03/30/21 140/80  ? ?Hyperlipidemia-currently managed with Lipitor 40 mg daily.  He denies myalgia or fatigue ?Lab Results  ?Component Value Date  ? CHOL 124 09/27/2020  ? HDL 49.40 09/27/2020  ? [place] 60 09/27/2020  ? TRIG 72.0 09/27/2020  ? CHOLHDL 3 09/27/2020  ? ?Tobacco Use -he is currently using a nicotine patch.  In the past he has tried Wellbutrin and Chantix.  He has quit multiple times in the past but eventually goes back to smoking. ? ?GERD- controlled  omeprazole 20 mg every other day. ? ?Essential tremor-takes propranolol 60 mg daily. ? ?Right Inguinal Hernia -he feels as though it is time to have this surgically repaired.  Denies enlargement of the hernia, pain, or bruising.  He has not noticed any changes in his bowel movements. ? ?Sinusitis -over the last week he has developed sinus pain and pressure, discolored rhinorrhea, shortness of breath, and wheezing.  At home he has been using over-the-counter cold and cough medication without much improvement. ? ?All immunizations and health maintenance protocols were reviewed with the patient and  needed orders were placed. ? ?Appropriate screening laboratory values were ordered for the patient including screening of hyperlipidemia, renal function and hepatic function. ?If indicated by BPH, a PSA was ordered. ? ?Medication reconciliation,  past medical history, social history, problem list and allergies were reviewed in detail with the patient ? ?Goals were established with regard to weight loss, exercise, and  diet in compliance with medications ? ?He is due for his 3-year colonoscopy with his last being in January 2020. ? ?Review of Systems  ?Constitutional: Negative.   ?HENT:  Positive for hearing loss, rhinorrhea, sinus pressure and sinus pain.   ?Eyes: Negative.   ?Respiratory:  Positive for shortness of breath and wheezing.   ?Cardiovascular: Negative.   ?Gastrointestinal: Negative.   ?Endocrine: Negative.   ?Genitourinary: Negative.   ?Musculoskeletal: Negative.   ?Skin: Negative.   ?Allergic/Immunologic: Negative.   ?Neurological: Negative.   ?Hematological: Negative.   ?Psychiatric/Behavioral: Negative.    ?All other systems reviewed and are negative. ? ?Past Medical History:  ?Diagnosis Date  ? ANEMIA-NOS 12/06/2008  ? BACK PAIN 02/20/2010  ? DIABETES MELLITUS, TYPE II 06/01/2008  ? GERD 06/01/2008  ? HYPERLIPIDEMIA 06/01/2008  ? HYPERTENSION 06/01/2008  ? Tremors of nervous system   ? pt states he has had tremors most of his life in his hands/hereditary  ? ? ?Social History  ? ?Socioeconomic History  ? Marital status: Married  ?  Spouse name: Not on file  ? Number of children: Not on file  ? Years of education: Not on file  ?  Highest education level: Not on file  ?Occupational History  ? Not on file  ?Tobacco Use  ? Smoking status: Every Day  ?  Packs/day: 2.00  ?  Years: 35.00  ?  Pack years: 70.00  ?  Types: Cigarettes  ? Smokeless tobacco: Never  ? Tobacco comments:  ?  down to 1.5 ppd X8 mos.  plans to quit on upcoming birthday  ?Vaping Use  ? Vaping Use: Never used  ?Substance and Sexual Activity  ?  Alcohol use: Yes  ?  Alcohol/week: 2.0 - 3.0 standard drinks  ?  Types: 2 - 3 Standard drinks or equivalent per week  ? Drug use: No  ? Sexual activity: Not on file  ?Other Topics Concern  ? Not on file  ?Social History Narrative  ? Not on file  ? ?Social Determinants of Health  ? ?Financial Resource Strain: Low Risk   ? Difficulty of Paying Living Expenses: Not hard at all  ?Food Insecurity: No Food Insecurity  ? Worried About Charity fundraiser in the Last Year: Never true  ? Ran Out of Food in the Last Year: Never true  ?Transportation Needs: No Transportation Needs  ? Lack of Transportation (Medical): No  ? Lack of Transportation (Non-Medical): No  ?Physical Activity: Sufficiently Active  ? Days of Exercise per Week: 5 days  ? Minutes of Exercise per Session: 50 min  ?Stress: No Stress Concern Present  ? Feeling of Stress : Not at all  ?Social Connections: Moderately Isolated  ? Frequency of Communication with Friends and Family: More than three times a week  ? Frequency of Social Gatherings with Friends and Family: More than three times a week  ? Attends Religious Services: Never  ? Active Member of Clubs or Organizations: No  ? Attends Archivist Meetings: Never  ? Marital Status: Married  ?Intimate Partner Violence: Not At Risk  ? Fear of Current or Ex-Partner: No  ? Emotionally Abused: No  ? Physically Abused: No  ? Sexually Abused: No  ? ? ?Past Surgical History:  ?Procedure Laterality Date  ? COLONOSCOPY    ? ELBOW BURSA SURGERY    ? right elbow  ? ELBOW BURSA SURGERY Left 11/12/2018  ? Pt had left olecranon bursectomy  ? HAND SURGERY    ? 2 surgeries on right hand  ? HAND SURGERY    ? left hand/removed a bone  ? ? ?Family History  ?Problem Relation Age of Onset  ? Diabetes Sister   ? Alzheimer's disease Sister   ? Diabetes Brother   ? Heart disease Mother   ? Sudden death Father   ?     Suicide after his wife passed away   ? Colon cancer Neg Hx   ? ? ?No Known Allergies ? ?Current Outpatient  Medications on File Prior to Visit  ?Medication Sig Dispense Refill  ? amLODipine (NORVASC) 5 MG tablet Take daily 90 tablet 3  ? Ascorbic Acid (VITAMIN C) 1000 MG tablet Take 1,000 mg by mouth daily.    ? aspirin EC 81 MG tablet Take 81 mg by mouth daily.    ? atorvastatin (LIPITOR) 40 MG tablet TAKE 1 TABLET(40 MG) BY MOUTH DAILY 90 tablet 1  ? cholecalciferol (VITAMIN D3) 25 MCG (1000 UNIT) tablet Take 1,000 Units by mouth daily.    ? glucose blood (FREESTYLE TEST STRIPS) test strip Use as instructed 100 each 12  ? glucose monitoring kit (FREESTYLE) monitoring kit USE TO TEST  BLOOD GLUCOSE DAILY. 1 each 0  ? ibuprofen (ADVIL) 200 MG tablet Take 200 mg by mouth 3 (three) times daily.    ? Lancets (FREESTYLE) lancets Use as instructed 100 each 12  ? lisinopril (ZESTRIL) 40 MG tablet Take 1 tablet (40 mg total) by mouth daily. 90 tablet 3  ? metFORMIN (GLUCOPHAGE) 500 MG tablet TAKE 1/2 TABLET(250 MG) BY MOUTH AT BEDTIME 45 tablet 1  ? nicotine (NICODERM CQ - DOSED IN MG/24 HOURS) 21 mg/24hr patch Place 21 mg onto the skin daily.    ? omeprazole (PRILOSEC) 20 MG capsule TAKE 1 CAPSULE(20 MG) BY MOUTH DAILY 90 capsule 3  ? propranolol ER (INDERAL LA) 120 MG 24 hr capsule TAKE 1 CAPSULE(120 MG) BY MOUTH DAILY 90 capsule 1  ? ?No current facility-administered medications on file prior to visit.  ? ? ?BP (!) 150/80 (BP Location: Left Arm, Patient Position: Sitting, Cuff Size: Normal)   Pulse 87   Temp 97.8 ?F (36.6 ?C) (Oral)   Ht _0  (1.702 m)   Wt 194 lb (88 kg)   SpO2 97%   BMI 30.38 kg/m?  ? ? ?   ?Objective:  ? Physical Exam ?Vitals and nursing note reviewed.  ?Constitutional:   ?   General: He is not in acute distress. ?   Appearance: Normal appearance. He is well-developed and normal weight.  ?HENT:  ?   Head: Normocephalic and atraumatic.  ?   Right Ear: Tympanic membrane, ear canal and external ear normal. There is no impacted cerumen.  ?   Left Ear: Tympanic membrane, ear canal and external ear normal.  There is no impacted cerumen.  ?   Nose: Congestion and rhinorrhea present. Rhinorrhea is purulent.  ?   Right Turbinates: Enlarged and swollen.  ?   Left Turbinates: Enlarged and swollen.  ?   Right Sinus: Maxil

## 2021-10-25 ENCOUNTER — Telehealth: Payer: Self-pay | Admitting: Adult Health

## 2021-10-25 NOTE — Telephone Encounter (Signed)
Can you please help with this referral?  ?

## 2021-10-25 NOTE — Telephone Encounter (Signed)
Sent to [facility] ? ?[address] ?[place], [place] [address] ? ?[phone] ?

## 2021-10-25 NOTE — Telephone Encounter (Signed)
Pt states [facility] does not take his insurance for referral [phone] ?

## 2021-10-26 NOTE — Telephone Encounter (Signed)
Patient notified of update  and verbalized understanding. 

## 2021-11-06 ENCOUNTER — Encounter: Payer: Self-pay | Admitting: Internal Medicine

## 2021-12-25 ENCOUNTER — Telehealth: Payer: Self-pay | Admitting: Pharmacist

## 2021-12-25 NOTE — Chronic Care Management (AMB) (Signed)
Chronic Care Management Pharmacy Assistant   Name: Richard [name]  MRN: [id number] DOB: 1951/04/09  Reason for Encounter: Disease State / Hypertension Assessment Call   Conditions to be addressed/monitored: HTN  Recent office visits:  09/29/2021 [name] NP - Patient was seen for routine general medical exam and additional issues. Referral to general surgery. Started Zpak and Prednisone. Follow up in 6 months.  Recent consult visits:  None  Hospital visits:  None  Medications: Outpatient Encounter Medications as of 12/25/2021  Medication Sig   amLODipine (NORVASC) 5 MG tablet Take daily   Ascorbic Acid (VITAMIN C) 1000 MG tablet Take 1,000 mg by mouth daily.   aspirin EC 81 MG tablet Take 81 mg by mouth daily.   atorvastatin (LIPITOR) 40 MG tablet TAKE 1 TABLET(40 MG) BY MOUTH DAILY   cholecalciferol (VITAMIN D3) 25 MCG (1000 UNIT) tablet Take 1,000 Units by mouth daily.   glucose blood (FREESTYLE TEST STRIPS) test strip Use as instructed   glucose monitoring kit (FREESTYLE) monitoring kit USE TO TEST BLOOD GLUCOSE DAILY.   ibuprofen (ADVIL) 200 MG tablet Take 200 mg by mouth 3 (three) times daily.   Lancets (FREESTYLE) lancets Use as instructed   lisinopril (ZESTRIL) 40 MG tablet Take 1 tablet (40 mg total) by mouth daily.   metFORMIN (GLUCOPHAGE) 500 MG tablet TAKE 1/2 TABLET(250 MG) BY MOUTH AT BEDTIME   nicotine (NICODERM CQ - DOSED IN MG/24 HOURS) 21 mg/24hr patch Place 21 mg onto the skin daily.   omeprazole (PRILOSEC) 20 MG capsule TAKE 1 CAPSULE(20 MG) BY MOUTH DAILY   predniSONE (DELTASONE) 10 MG tablet Take 1 tablet (10 mg total) by mouth daily with breakfast.   propranolol ER (INDERAL LA) 120 MG 24 hr capsule TAKE 1 CAPSULE(120 MG) BY MOUTH DAILY   No facility-administered encounter medications on file as of 12/25/2021.  Fill History: AMLODIPINE BESYLATE 5MG TABLETS 12/18/2021 90   ATORVASTATIN 40MG TABLETS 09/29/2021 90   LISINOPRIL 40MG TABLETS  10/07/2021 90   METFORMIN 500MG TABLETS 08/01/2021 90   OMEPRAZOLE 20MG CAPSULES 10/07/2021 90   PROPRANOLOL ER 120MG CAPSULES 11/30/2021 90   Reviewed chart prior to disease state call. Spoke with patient regarding BP  Recent Office Vitals: BP Readings from Last 3 Encounters:  09/29/21 (!) 150/80  05/12/21 128/68  03/30/21 140/80   Pulse Readings from Last 3 Encounters:  09/29/21 87  05/12/21 62  03/30/21 74    Wt Readings from Last 3 Encounters:  09/29/21 194 lb (88 kg)  05/12/21 191 lb (86.6 kg)  03/30/21 191 lb (86.6 kg)     Kidney Function Lab Results  Component Value Date/Time   CREATININE 1.48 09/29/2021 08:07 AM   CREATININE 1.36 09/27/2020 08:23 AM   GFR 48.38 (L) 09/29/2021 08:07 AM   GFRNONAA 69.80 11/30/2009 08:51 AM       Latest Ref Rng & Units 09/29/2021    8:07 AM 09/27/2020    8:23 AM 09/25/2019    8:33 AM  BMP  Glucose 70 - 99 mg/dL 120  106  104   BUN 6 - 23 mg/dL _0 Creatinine 0.40 - 1.50 mg/dL 1.48  1.36  1.39   Sodium 135 - 145 mEq/L 136  138  134   Potassium 3.5 - 5.1 mEq/L 4.2  4.5  5.1   Chloride 96 - 112 mEq/L 100  103  101   CO2 19 - 32 mEq/L 27  28  26  Calcium 8.4 - 10.5 mg/dL 9.6  9.6  9.6     Current antihypertensive regimen:  Amlodipine 5 mg daily Lisinopril 40 mg daily Propranolol ER 120 mg daily  How often are you checking your Blood Pressure? Patient is checking his blood pressures twice a month.   Current home BP readings: Patient states his last reading about 1.5 weeks ago was 120/78.  He states this is where they always run, he states they never go over 125/80.   What recent interventions/DTPs have been made by any provider to improve Blood Pressure control since last CPP Visit: No recent interventions  Any recent hospitalizations or ED visits since last visit with CPP? No recent hospital visits.  What diet changes have been made to improve Blood Pressure Control?  Patient does not follow any plan Breakfast  - patient will have cereal and occasionally country ham with eggs Lunch - patient doesn't eat lunch Dinner - patient eats a variety containing meats, vegetables and potatoes. Snack - patient will on a rare occasion have cheese or a piece of chocolate.  What exercise is being done to improve your Blood Pressure Control?  Patient states he stays busy, does all the housework, yard work and works on old cars as a hobby and spends a lot of time doing this.  Adherence Review: Is the patient currently on ACE/ARB medication? Yes Does the patient have >5 day gap between last estimated fill dates? No  Care Gaps: AWV - scheduled for 05/14/2022 Last BP - 150/80 on 09/29/2021 Last A1C - 6.0 on 09/29/2021 Eye exam - overdue Covid booster - overdue Colonoscopy - overdue  Star Rating Drugs: Atorvastatin 52m - last filled 09/29/2021 90 DS at [facility]  Lisinopril 47m- last filled 10/07/2021 90 DS at [facility] Metformin 50014m [facility] [phone]

## 2022-01-03 ENCOUNTER — Ambulatory Visit: Payer: Medicare (Managed Care) | Admitting: Adult Health

## 2022-01-04 ENCOUNTER — Encounter: Payer: Self-pay | Admitting: Adult Health

## 2022-01-04 ENCOUNTER — Ambulatory Visit: Payer: Medicare (Managed Care) | Admitting: Adult Health

## 2022-01-04 VITALS — BP 124/80 | HR 58 | Temp 98.2°F | Ht 67.0 in | Wt 190.2 lb

## 2022-01-04 DIAGNOSIS — I10 Essential (primary) hypertension: Secondary | ICD-10-CM | POA: Diagnosis not present

## 2022-01-04 DIAGNOSIS — E1129 Type 2 diabetes mellitus with other diabetic kidney complication: Secondary | ICD-10-CM

## 2022-01-04 LAB — POCT GLYCOSYLATED HEMOGLOBIN (HGB A1C): Hemoglobin A1C: 5.6 % (ref 4.0–5.6)

## 2022-01-04 NOTE — Progress Notes (Signed)
Subjective:    Patient ID: Richard [name], male    DOB: 04-27-1951, 71 y.o.   MRN: [id number]  HPI 71 year old male who  has a past medical history of ANEMIA-NOS (12/06/2008), BACK PAIN (02/20/2010), DIABETES MELLITUS, TYPE II (06/01/2008), GERD (06/01/2008), HYPERLIPIDEMIA (06/01/2008), HYPERTENSION (06/01/2008), and Tremors of nervous system.  He presents to the office today for follow-up regarding diabetes and hypertension.  Diabetes mellitus type 2-during his last visit 3 months ago his A1c was 6.0.  He was only maintained with metformin 250 mg daily.  We decided to try coming off this medication and see if his A1c stayed stable.  He does eat healthy and enjoys staying active. Lab Results  Component Value Date   HGBA1C 6.0 09/29/2021   Wt Readings from Last 3 Encounters:  01/04/22 190 lb 3.2 oz (86.3 kg)  09/29/21 194 lb (88 kg)  05/12/21 191 lb (86.6 kg)    Hypertension-takes lisinopril 40 mg daily and Norvasc 5 mg daily.  He denies episodes of dizziness, lightheadedness, chest pain, or shortness of breath BP Readings from Last 3 Encounters:  01/04/22 124/80  09/29/21 (!) 150/80  05/12/21 128/68   Review of Systems See  HPI   Past Medical History:  Diagnosis Date   ANEMIA-NOS 12/06/2008   BACK PAIN 02/20/2010   DIABETES MELLITUS, TYPE II 06/01/2008   GERD 06/01/2008   HYPERLIPIDEMIA 06/01/2008   HYPERTENSION 06/01/2008   Tremors of nervous system    pt states he has had tremors most of his life in his hands/hereditary    Social History   Socioeconomic History   Marital status: Married    Spouse name: Not on file   Number of children: Not on file   Years of education: Not on file   Highest education level: Not on file  Occupational History   Not on file  Tobacco Use   Smoking status: Every Day    Packs/day: 2.00    Years: 35.00    Total pack years: 70.00    Types: Cigarettes   Smokeless tobacco: Never   Tobacco comments:    down to 1.5 ppd X8 mos.  plans to quit on  upcoming birthday  Vaping Use   Vaping Use: Never used  Substance and Sexual Activity   Alcohol use: Yes    Alcohol/week: 2.0 - 3.0 standard drinks of alcohol    Types: 2 - 3 Standard drinks or equivalent per week   Drug use: No   Sexual activity: Not on file  Other Topics Concern   Not on file  Social History Narrative   Not on file   Social Determinants of Health   Financial Resource Strain: Low Risk  (05/12/2021)   Overall Financial Resource Strain (CARDIA)    Difficulty of Paying Living Expenses: Not hard at all  Food Insecurity: No Food Insecurity (05/12/2021)   Hunger Vital Sign    Worried About Running Out of Food in the Last Year: Never true    Ran Out of Food in the Last Year: Never true  Transportation Needs: No Transportation Needs (05/12/2021)   PRAPARE - Hydrologist (Medical): No    Lack of Transportation (Non-Medical): No  Physical Activity: Sufficiently Active (05/12/2021)   Exercise Vital Sign    Days of Exercise per Week: 5 days    Minutes of Exercise per Session: 50 min  Stress: No Stress Concern Present (05/12/2021)   [place]  Stress Questionnaire    Feeling of Stress : Not at all  Social Connections: Moderately Isolated (05/12/2021)   Social Connection and Isolation Panel [NHANES]    Frequency of Communication with Friends and Family: More than three times a week    Frequency of Social Gatherings with Friends and Family: More than three times a week    Attends Religious Services: Never    Marine scientist or Organizations: No    Attends Archivist Meetings: Never    Marital Status: Married  Human resources officer Violence: Not At Risk (05/12/2021)   Humiliation, Afraid, Rape, and Kick questionnaire    Fear of Current or Ex-Partner: No    Emotionally Abused: No    Physically Abused: No    Sexually Abused: No    Past Surgical History:  Procedure Laterality Date    COLONOSCOPY     ELBOW BURSA SURGERY     right elbow   ELBOW BURSA SURGERY Left 11/12/2018   Pt had left olecranon bursectomy   HAND SURGERY     2 surgeries on right hand   HAND SURGERY     left hand/removed a bone    Family History  Problem Relation Age of Onset   Diabetes Sister    Alzheimer's disease Sister    Diabetes Brother    Heart disease Mother    Sudden death Father        Suicide after his wife passed away    Colon cancer Neg Hx     No Known Allergies  Current Outpatient Medications on File Prior to Visit  Medication Sig Dispense Refill   amLODipine (NORVASC) 5 MG tablet Take daily 90 tablet 3   Ascorbic Acid (VITAMIN C) 1000 MG tablet Take 1,000 mg by mouth daily.     aspirin EC 81 MG tablet Take 81 mg by mouth daily.     atorvastatin (LIPITOR) 40 MG tablet TAKE 1 TABLET(40 MG) BY MOUTH DAILY 90 tablet 3   cholecalciferol (VITAMIN D3) 25 MCG (1000 UNIT) tablet Take 1,000 Units by mouth daily.     glucose blood (FREESTYLE TEST STRIPS) test strip Use as instructed 100 each 12   glucose monitoring kit (FREESTYLE) monitoring kit USE TO TEST BLOOD GLUCOSE DAILY. 1 each 0   ibuprofen (ADVIL) 200 MG tablet Take 200 mg by mouth 3 (three) times daily.     Lancets (FREESTYLE) lancets Use as instructed 100 each 12   lisinopril (ZESTRIL) 40 MG tablet Take 1 tablet (40 mg total) by mouth daily. 90 tablet 3   metFORMIN (GLUCOPHAGE) 500 MG tablet TAKE 1/2 TABLET(250 MG) BY MOUTH AT BEDTIME 45 tablet 1   nicotine (NICODERM CQ - DOSED IN MG/24 HOURS) 21 mg/24hr patch Place 21 mg onto the skin daily.     omeprazole (PRILOSEC) 20 MG capsule TAKE 1 CAPSULE(20 MG) BY MOUTH DAILY 90 capsule 3   predniSONE (DELTASONE) 10 MG tablet Take 1 tablet (10 mg total) by mouth daily with breakfast. 7 tablet 0   propranolol ER (INDERAL LA) 120 MG 24 hr capsule TAKE 1 CAPSULE(120 MG) BY MOUTH DAILY 90 capsule 1   No current facility-administered medications on file prior to visit.    BP 124/80  (BP Location: Right Arm)   Pulse (!) 58   Temp 98.2 F (36.8 C) (Oral)   Ht _0  (1.702 m)   Wt 190 lb 3.2 oz (86.3 kg)   SpO2 98%   BMI 29.79 kg/m  Objective:   Physical Exam Vitals and nursing note reviewed.  Constitutional:      Appearance: Normal appearance.  Cardiovascular:     Rate and Rhythm: Normal rate and regular rhythm.     Pulses: Normal pulses.     Heart sounds: Normal heart sounds.  Pulmonary:     Effort: Pulmonary effort is normal.     Breath sounds: Normal breath sounds.  Musculoskeletal:        General: Normal range of motion.  Skin:    General: Skin is warm and dry.     Capillary Refill: Capillary refill takes less than 2 seconds.  Neurological:     General: No focal deficit present.     Mental Status: He is alert and oriented to person, place, and time.  Psychiatric:        Mood and Affect: Mood normal.        Behavior: Behavior normal.        Thought Content: Thought content normal.        Judgment: Judgment normal.       Assessment & Plan:  1. Type 2 diabetes mellitus with other diabetic kidney complication, without long-term current use of insulin (HCC)  - POC HgB A1c- 5.8  - Has improved with being off metformin  - Will recheck at his CPE in a 7 months  - Continue to eat healthy and stay active   2. Essential hypertension - Well controlled.  - No change in medications   [name], NP

## 2022-01-08 ENCOUNTER — Telehealth: Payer: Self-pay | Admitting: Adult Health

## 2022-01-08 NOTE — Telephone Encounter (Signed)
Patient states he isn't supposed to have a copay for his pcp visits.  Pt was here on 01/04/22 and was charged a $35 copay.  He called billing and billing said we switched our system over in March so that is when he started having to pay copays.    Patient verified his cell phone number in chart as being the correct number to return his call.

## 2022-01-08 NOTE — Telephone Encounter (Signed)
Hi Dawn could you assist with this.

## 2022-02-26 ENCOUNTER — Other Ambulatory Visit: Payer: Self-pay | Admitting: Adult Health

## 2022-02-26 DIAGNOSIS — G250 Essential tremor: Secondary | ICD-10-CM

## 2022-05-16 ENCOUNTER — Ambulatory Visit (INDEPENDENT_AMBULATORY_CARE_PROVIDER_SITE_OTHER): Payer: Medicare (Managed Care)

## 2022-05-16 VITALS — Ht 67.0 in | Wt 190.0 lb

## 2022-05-16 DIAGNOSIS — Z0000 Encounter for general adult medical examination without abnormal findings: Secondary | ICD-10-CM | POA: Diagnosis not present

## 2022-05-16 NOTE — Progress Notes (Signed)
Subjective:   Richard [name] is a 71 y.o. male who presents for Medicare Annual/Subsequent preventive examination.  Review of Systems    Virtual Visit via Telephone Note  I connected with  Richard [name] on 05/16/22 at 12:30 PM EST by telephone and verified that I am speaking with the correct person using two identifiers.  Location: Patient: Home  Provider: Office Persons participating in the virtual visit: patient/Nurse Health Advisor   I discussed the limitations, risks, security and privacy concerns of performing an evaluation and management service by telephone and the availability of in person appointments. The patient expressed understanding and agreed to proceed.  Interactive audio and video telecommunications were attempted between this nurse and patient, however failed, due to patient having technical difficulties OR patient did not have access to video capability.  We continued and completed visit with audio only.  Some vital signs may be absent or patient reported.   Criselda Peaches, LPN  Cardiac Risk Factors include: advanced age (>44mn, >>73women);male gender;hypertension     Objective:    Today's Vitals   05/16/22 1238  Weight: 190 lb (86.2 kg)  Height: _0  (1.702 m)   Body mass index is 29.76 kg/m.     05/16/2022   12:48 PM 05/12/2021    8:50 AM 05/06/2020    8:34 AM  Advanced Directives  Does Patient Have a Medical Advance Directive? No No No  Would patient like information on creating a medical advance directive? No - Patient declined No - Patient declined No - Patient declined    Current Medications (verified) Outpatient Encounter Medications as of 05/16/2022  Medication Sig   amLODipine (NORVASC) 5 MG tablet Take daily   Ascorbic Acid (VITAMIN C) 1000 MG tablet Take 1,000 mg by mouth daily.   atorvastatin (LIPITOR) 40 MG tablet TAKE 1 TABLET(40 MG) BY MOUTH DAILY   cholecalciferol (VITAMIN D3) 25 MCG (1000 UNIT) tablet Take 1,000 Units by  mouth daily.   glucose blood (FREESTYLE TEST STRIPS) test strip Use as instructed   glucose monitoring kit (FREESTYLE) monitoring kit USE TO TEST BLOOD GLUCOSE DAILY.   ibuprofen (ADVIL) 200 MG tablet Take 200 mg by mouth 3 (three) times daily.   Lancets (FREESTYLE) lancets Use as instructed   lisinopril (ZESTRIL) 40 MG tablet Take 1 tablet (40 mg total) by mouth daily.   nicotine (NICODERM CQ - DOSED IN MG/24 HOURS) 21 mg/24hr patch Place 21 mg onto the skin daily.   omeprazole (PRILOSEC) 20 MG capsule TAKE 1 CAPSULE(20 MG) BY MOUTH DAILY   propranolol ER (INDERAL LA) 120 MG 24 hr capsule TAKE 1 CAPSULE(120 MG) BY MOUTH DAILY   No facility-administered encounter medications on file as of 05/16/2022.    Allergies (verified) Patient has no known allergies.   History: Past Medical History:  Diagnosis Date   ANEMIA-NOS 12/06/2008   BACK PAIN 02/20/2010   DIABETES MELLITUS, TYPE II 06/01/2008   GERD 06/01/2008   HYPERLIPIDEMIA 06/01/2008   HYPERTENSION 06/01/2008   Tremors of nervous system    pt states he has had tremors most of his life in his hands/hereditary   Past Surgical History:  Procedure Laterality Date   COLONOSCOPY     ELBOW BURSA SURGERY     right elbow   ELBOW BURSA SURGERY Left 11/12/2018   Pt had left olecranon bursectomy   HAND SURGERY     2 surgeries on right hand   HAND SURGERY     left hand/removed a  bone   Family History  Problem Relation Age of Onset   Diabetes Sister    Alzheimer's disease Sister    Diabetes Brother    Heart disease Mother    Sudden death Father        Suicide after his wife passed away    Colon cancer Neg Hx    Social History   Socioeconomic History   Marital status: Married    Spouse name: Not on file   Number of children: Not on file   Years of education: Not on file   Highest education level: Not on file  Occupational History   Not on file  Tobacco Use   Smoking status: Every Day    Packs/day: 2.00    Years: 35.00     Total pack years: 70.00    Types: Cigarettes   Smokeless tobacco: Never   Tobacco comments:    down to 1.5 ppd X8 mos.  plans to quit on upcoming birthday  Vaping Use   Vaping Use: Never used  Substance and Sexual Activity   Alcohol use: Yes    Alcohol/week: 2.0 - 3.0 standard drinks of alcohol    Types: 2 - 3 Standard drinks or equivalent per week   Drug use: No   Sexual activity: Not on file  Other Topics Concern   Not on file  Social History Narrative   Not on file   Social Determinants of Health   Financial Resource Strain: Low Risk  (05/16/2022)   Overall Financial Resource Strain (CARDIA)    Difficulty of Paying Living Expenses: Not hard at all  Food Insecurity: No Food Insecurity (05/16/2022)   Hunger Vital Sign    Worried About Running Out of Food in the Last Year: Never true    Ran Out of Food in the Last Year: Never true  Transportation Needs: No Transportation Needs (05/16/2022)   PRAPARE - Hydrologist (Medical): No    Lack of Transportation (Non-Medical): No  Physical Activity: Inactive (05/16/2022)   Exercise Vital Sign    Days of Exercise per Week: 0 days    Minutes of Exercise per Session: 0 min  Stress: No Stress Concern Present (05/16/2022)   [place]    Feeling of Stress : Not at all  Social Connections: Moderately Isolated (05/16/2022)   Social Connection and Isolation Panel [NHANES]    Frequency of Communication with Friends and Family: More than three times a week    Frequency of Social Gatherings with Friends and Family: More than three times a week    Attends Religious Services: Never    Marine scientist or Organizations: No    Attends Archivist Meetings: Never    Marital Status: Married    Tobacco Counseling Ready to quit: Yes Counseling given: Yes Tobacco comments: down to 1.5 ppd X8 mos.  plans to quit on upcoming  birthday   Clinical Intake:  Pre-visit preparation completed: No  Pain : No/denies pain     BMI - recorded: 29.76 Nutritional Status: BMI 25 -29 Overweight Nutritional Risks: None Diabetes: No  How often do you need to have someone help you when you read instructions, pamphlets, or other written materials from your doctor or pharmacy?: 1 - Never  Diabetic?  No  Interpreter Needed?: No  Information entered by :: [name] LPN   Activities of Daily Living    05/16/2022   12:46 PM  In your present state of health, do you have any difficulty performing the following activities:  Hearing? 1  Comment Wears hearing aids  Vision? 0  Difficulty concentrating or making decisions? 0  Walking or climbing stairs? 0  Dressing or bathing? 0  Doing errands, shopping? 0  Preparing Food and eating ? N  Using the Toilet? N  In the past six months, have you accidently leaked urine? N  Do you have problems with loss of bowel control? N  Managing your Medications? N  Managing your Finances? N  Housekeeping or managing your Housekeeping? N    Patient Care Team: [name], NP as PCP - General (Family Medicine) Iran Planas, MD as Consulting Physician (Orthopedic Surgery) Viona Gilmore, [facility] as Pharmacist (Pharmacist)  Indicate any recent Medical Services you may have received from other than [facility] providers in the past year (date may be approximate).     Assessment:   This is a routine wellness examination for Hayze.  Hearing/Vision screen Hearing Screening - Comments:: Wears hearing aids Vision Screening - Comments:: Wears reading glasses - up to date with routine eye exams with patient deferred   Dietary issues and exercise activities discussed: Current Exercise Habits: The patient does not participate in regular exercise at present, Exercise limited by: None identified   Goals Addressed               This Visit's Progress     Quit Smoking (pt-stated)          Depression Screen    05/16/2022   12:44 PM 01/04/2022    8:32 AM 05/12/2021    8:40 AM 05/06/2020    8:37 AM 09/24/2019   11:25 AM 07/16/2018    7:25 AM 01/14/2018   10:17 AM  PHQ 2/9 Scores  PHQ - 2 Score 0 1 0 0 0 0 2  PHQ- 9 Score  1  0   8    Fall Risk    05/16/2022   12:47 PM 01/04/2022    8:31 AM 05/12/2021    8:45 AM 05/06/2020    8:36 AM 03/23/2020    7:22 AM  Fall Risk   Falls in the past year? 0 0 0 0 0  Number falls in past yr: 0 0 0 0 0  Injury with Fall? 0 0 0 0 0  Risk for fall due to : No Fall Risks No Fall Risks   No Fall Risks  Follow up Falls prevention discussed Falls prevention discussed   Falls evaluation completed    FALL RISK PREVENTION PERTAINING TO THE HOME:  Any stairs in or around the home? Yes  If so, are there any without handrails? No  Home free of loose throw rugs in walkways, pet beds, electrical cords, etc? Yes  Adequate lighting in your home to reduce risk of falls? Yes   ASSISTIVE DEVICES UTILIZED TO PREVENT FALLS:  Life alert? No  Use of a cane, walker or w/c? No  Grab bars in the bathroom? Yes Shower chair or bench in shower? Yes  Elevated toilet seat or a handicapped toilet? Yes   TIMED UP AND GO:  Was the test performed? No . Audio Visit    Cognitive Function:        05/16/2022   12:48 PM 05/12/2021    8:48 AM  6CIT Screen  What Year? 0 points 0 points  What month? 0 points 0 points  What time? 0 points 0 points  Count back from  20 0 points 0 points  Months in reverse 0 points 0 points  Repeat phrase 0 points 0 points  Total Score 0 points 0 points    Immunizations Immunization History  Administered Date(s) Administered   Fluad Quad(high Dose 65+) 06/23/2020, 03/30/2021   Influenza, Seasonal, Injecte, Preservative Fre 03/10/2019   Influenza,inj,Quad PF,6+ Mos 04/05/2015, 05/01/2016, 05/16/2018   Influenza-Unspecified 04/22/2017   PFIZER(Purple Top)SARS-COV-2 Vaccination 09/03/2019, 09/30/2019, 06/08/2020    Pneumococcal Conjugate-13 07/16/2018   Pneumococcal Polysaccharide-23 05/30/2017   Td 07/02/2005   Zoster Recombinat (Shingrix) 06/19/2018, 11/10/2018      Flu Vaccine status: Up to date  Pneumococcal vaccine status: Up to date  Covid-19 vaccine status: Completed vaccines  Qualifies for Shingles Vaccine? Yes   Zostavax completed Yes   Shingrix Completed?: Yes  Screening Tests Health Maintenance  Topic Date Due   OPHTHALMOLOGY EXAM  04/22/2012   Lung Cancer Screening  09/02/2020   Pneumonia Vaccine 23+ Years old (76 - PPSV23 or PCV20) 05/30/2022   Diabetic kidney evaluation - Urine ACR  05/17/2022 (Originally 05/30/2018)   COLONOSCOPY (Pts 45-46yr Insurance coverage will need to be confirmed)  07/07/2022 (Originally 07/09/2021)   COVID-19 Vaccine (4 - Pfizer series) 07/07/2022 (Originally 08/03/2020)   INFLUENZA VACCINE  09/30/2022 (Originally 01/30/2022)   HEMOGLOBIN A1C  07/07/2022   Diabetic kidney evaluation - GFR measurement  09/30/2022   FOOT EXAM  09/30/2022   Medicare Annual Wellness (AWV)  05/17/2023   Hepatitis C Screening  Completed   Zoster Vaccines- Shingrix  Completed   HPV VACCINES  Aged Out    Health Maintenance  Health Maintenance Due  Topic Date Due   OPHTHALMOLOGY EXAM  04/22/2012   Lung Cancer Screening  09/02/2020   Pneumonia Vaccine 69 Years old (390- PPSV23 or PCV20) 05/30/2022    Colorectal cancer screening: Referral to GI placed Patient deferred. Pt aware the office will call re: appt.  Lung Cancer Screening: (Low Dose CT Chest recommended if Age 69-80years, 30 pack-year currently smoking OR have quit w/in 15years.) does qualify.   Lung Cancer Screening Referral: Patient deferred  Additional Screening:  Hepatitis C Screening: does qualify; Completed 05/30/17  Vision Screening: Recommended annual ophthalmology exams for early detection of glaucoma and other disorders of the eye. Is the patient up to date with their annual eye exam?  Yes   Who is the provider or what is the name of the office in which the patient attends annual eye exams? Deferred If pt is not established with a provider, would they like to be referred to a provider to establish care? No .   Dental Screening: Recommended annual dental exams for proper oral hygiene  Community Resource Referral / Chronic Care Management:  CRR required this visit?  No   CCM required this visit?  No      Plan:     I have personally reviewed and noted the following in the patient's chart:   Medical and social history Use of alcohol, tobacco or illicit drugs  Current medications and supplements including opioid prescriptions. Patient is not currently taking opioid prescriptions. Functional ability and status Nutritional status Physical activity Advanced directives List of other physicians Hospitalizations, surgeries, and ER visits in previous 12 months Vitals Screenings to include cognitive, depression, and falls Referrals and appointments  In addition, I have reviewed and discussed with patient certain preventive protocols, quality metrics, and best practice recommendations. A written personalized care plan for preventive services as well as general preventive health recommendations were  provided to patient.     Criselda Peaches, LPN   [id number]   Nurse Notes: Patient due Diabetic Kidney Evaluation-Urine ACR

## 2022-05-16 NOTE — Patient Instructions (Addendum)
Richard [name] , Thank you for taking time to come for your Medicare Wellness Visit. I appreciate your ongoing commitment to your health goals. Please review the following plan we discussed and let me know if I can assist you in the future.   These are the goals we discussed:  Goals       Exercise 3x per week (30 min per time)      Quit Smoking      Quit Smoking (pt-stated)        This is a list of the screening recommended for you and due dates:  Health Maintenance  Topic Date Due   Eye exam for diabetics  04/22/2012   Screening for Lung Cancer  09/02/2020   Pneumonia Vaccine (3 - PPSV23 or PCV20) 05/30/2022   Yearly kidney health urinalysis for diabetes  05/17/2022*   Colon Cancer Screening  07/07/2022*   COVID-19 Vaccine (4 - Pfizer series) 07/07/2022*   Flu Shot  09/30/2022*   Hemoglobin A1C  07/07/2022   Yearly kidney function blood test for diabetes  09/30/2022   Complete foot exam   09/30/2022   Medicare Annual Wellness Visit  05/17/2023   Hepatitis C Screening: USPSTF Recommendation to screen - Ages 18-79 yo.  Completed   Zoster (Shingles) Vaccine  Completed   HPV Vaccine  Aged Out  *Topic was postponed. The date shown is not the original due date.    Advanced directives: Advance directive discussed with you today. Even though you declined this today, please call our office should you change your mind, and we can give you the proper paperwork for you to fill out.   Conditions/risks identified: None  Next appointment: Follow up in one year for your annual wellness visit.   Preventive Care 40 Years and Older, Male  Preventive care refers to lifestyle choices and visits with your health care provider that can promote health and wellness. What does preventive care include? A yearly physical exam. This is also called an annual well check. Dental exams once or twice a year. Routine eye exams. Ask your health care provider how often you should have your eyes  checked. Personal lifestyle choices, including: Daily care of your teeth and gums. Regular physical activity. Eating a healthy diet. Avoiding tobacco and drug use. Limiting alcohol use. Practicing safe sex. Taking low doses of aspirin every day. Taking vitamin and mineral supplements as recommended by your health care provider. What happens during an annual well check? The services and screenings done by your health care provider during your annual well check will depend on your age, overall health, lifestyle risk factors, and family history of disease. Counseling  Your health care provider may ask you questions about your: Alcohol use. Tobacco use. Drug use. Emotional well-being. Home and relationship well-being. Sexual activity. Eating habits. History of falls. Memory and ability to understand (cognition). Work and work Statistician. Screening  You may have the following tests or measurements: Height, weight, and BMI. Blood pressure. Lipid and cholesterol levels. These may be checked every 5 years, or more frequently if you are over 40 years old. Skin check. Lung cancer screening. You may have this screening every year starting at age 42 if you have a 30-pack-year history of smoking and currently smoke or have quit within the past 15 years. Fecal occult blood test (FOBT) of the stool. You may have this test every year starting at age 69. Flexible sigmoidoscopy or colonoscopy. You may have a sigmoidoscopy every 5 years or a  colonoscopy every 10 years starting at age 56. Prostate cancer screening. Recommendations will vary depending on your family history and other risks. Hepatitis C blood test. Hepatitis B blood test. Sexually transmitted disease (STD) testing. Diabetes screening. This is done by checking your blood sugar (glucose) after you have not eaten for a while (fasting). You may have this done every 1-3 years. Abdominal aortic aneurysm (AAA) screening. You may need this  if you are a current or former smoker. Osteoporosis. You may be screened starting at age 87 if you are at high risk. Talk with your health care provider about your test results, treatment options, and if necessary, the need for more tests. Vaccines  Your health care provider may recommend certain vaccines, such as: Influenza vaccine. This is recommended every year. Tetanus, diphtheria, and acellular pertussis (Tdap, Td) vaccine. You may need a Td booster every 10 years. Zoster vaccine. You may need this after age 50. Pneumococcal 13-valent conjugate (PCV13) vaccine. One dose is recommended after age 66. Pneumococcal polysaccharide (PPSV23) vaccine. One dose is recommended after age 54. Talk to your health care provider about which screenings and vaccines you need and how often you need them. This information is not intended to replace advice given to you by your health care provider. Make sure you discuss any questions you have with your health care provider. Document Released: 07/15/2015 Document Revised: 03/07/2016 Document Reviewed: 04/19/2015 Elsevier Interactive Patient Education  2017 [place] Prevention in the Home Falls can cause injuries. They can happen to people of all ages. There are many things you can do to make your home safe and to help prevent falls. What can I do on the outside of my home? Regularly fix the edges of walkways and driveways and fix any cracks. Remove anything that might make you trip as you walk through a door, such as a raised step or threshold. Trim any bushes or trees on the path to your home. Use bright outdoor lighting. Clear any walking paths of anything that might make someone trip, such as rocks or tools. Regularly check to see if handrails are loose or broken. Make sure that both sides of any steps have handrails. Any raised decks and porches should have guardrails on the edges. Have any leaves, snow, or ice cleared regularly. Use sand  or salt on walking paths during winter. Clean up any spills in your garage right away. This includes oil or grease spills. What can I do in the bathroom? Use night lights. Install grab bars by the toilet and in the tub and shower. Do not use towel bars as grab bars. Use non-skid mats or decals in the tub or shower. If you need to sit down in the shower, use a plastic, non-slip stool. Keep the floor dry. Clean up any water that spills on the floor as soon as it happens. Remove soap buildup in the tub or shower regularly. Attach bath mats securely with double-sided non-slip rug tape. Do not have throw rugs and other things on the floor that can make you trip. What can I do in the bedroom? Use night lights. Make sure that you have a light by your bed that is easy to reach. Do not use any sheets or blankets that are too big for your bed. They should not hang down onto the floor. Have a firm chair that has side arms. You can use this for support while you get dressed. Do not have throw rugs and other things  on the floor that can make you trip. What can I do in the kitchen? Clean up any spills right away. Avoid walking on wet floors. Keep items that you use a lot in easy-to-reach places. If you need to reach something above you, use a strong step stool that has a grab bar. Keep electrical cords out of the way. Do not use floor polish or wax that makes floors slippery. If you must use wax, use non-skid floor wax. Do not have throw rugs and other things on the floor that can make you trip. What can I do with my stairs? Do not leave any items on the stairs. Make sure that there are handrails on both sides of the stairs and use them. Fix handrails that are broken or loose. Make sure that handrails are as long as the stairways. Check any carpeting to make sure that it is firmly attached to the stairs. Fix any carpet that is loose or worn. Avoid having throw rugs at the top or bottom of the stairs.  If you do have throw rugs, attach them to the floor with carpet tape. Make sure that you have a light switch at the top of the stairs and the bottom of the stairs. If you do not have them, ask someone to add them for you. What else can I do to help prevent falls? Wear shoes that: Do not have high heels. Have rubber bottoms. Are comfortable and fit you well. Are closed at the toe. Do not wear sandals. If you use a stepladder: Make sure that it is fully opened. Do not climb a closed stepladder. Make sure that both sides of the stepladder are locked into place. Ask someone to hold it for you, if possible. Clearly mark and make sure that you can see: Any grab bars or handrails. First and last steps. Where the edge of each step is. Use tools that help you move around (mobility aids) if they are needed. These include: Canes. Walkers. Scooters. Crutches. Turn on the lights when you go into a dark area. Replace any light bulbs as soon as they burn out. Set up your furniture so you have a clear path. Avoid moving your furniture around. If any of your floors are uneven, fix them. If there are any pets around you, be aware of where they are. Review your medicines with your doctor. Some medicines can make you feel dizzy. This can increase your chance of falling. Ask your doctor what other things that you can do to help prevent falls. This information is not intended to replace advice given to you by your health care provider. Make sure you discuss any questions you have with your health care provider. Document Released: 04/14/2009 Document Revised: 11/24/2015 Document Reviewed: 07/23/2014 Elsevier Interactive Patient Education  2017 [employer].

## 2022-05-21 ENCOUNTER — Telehealth: Payer: Self-pay | Admitting: Pharmacist

## 2022-05-21 NOTE — Chronic Care Management (AMB) (Signed)
Chronic Care Management Pharmacy Assistant   Name: Richard [name]  MRN: [id number] DOB: 08-09-1950  Reason for Encounter: Disease State / Hypertension Assessment Call   Conditions to be addressed/monitored: HTN  Recent office visits:  05/16/2022 Richard Arbour LPN - Medicare annual wellness exam   01/04/2022 Richard Peng NP - Patient was seen for Type 2 diabetes mellitus with other diabetic kidney complication, without long-term current use of insulin and an additional concern. Discontinued Asprin, Metformin and Prednisone.  Recent consult visits:  None  Hospital visits:  None  Medications: Outpatient Encounter Medications as of 05/21/2022  Medication Sig   amLODipine (NORVASC) 5 MG tablet Take daily   Ascorbic Acid (VITAMIN C) 1000 MG tablet Take 1,000 mg by mouth daily.   atorvastatin (LIPITOR) 40 MG tablet TAKE 1 TABLET(40 MG) BY MOUTH DAILY   cholecalciferol (VITAMIN D3) 25 MCG (1000 UNIT) tablet Take 1,000 Units by mouth daily.   glucose blood (FREESTYLE TEST STRIPS) test strip Use as instructed   glucose monitoring kit (FREESTYLE) monitoring kit USE TO TEST BLOOD GLUCOSE DAILY.   ibuprofen (ADVIL) 200 MG tablet Take 200 mg by mouth 3 (three) times daily.   Lancets (FREESTYLE) lancets Use as instructed   lisinopril (ZESTRIL) 40 MG tablet Take 1 tablet (40 mg total) by mouth daily.   nicotine (NICODERM CQ - DOSED IN MG/24 HOURS) 21 mg/24hr patch Place 21 mg onto the skin daily.   omeprazole (PRILOSEC) 20 MG capsule TAKE 1 CAPSULE(20 MG) BY MOUTH DAILY   propranolol ER (INDERAL LA) 120 MG 24 hr capsule TAKE 1 CAPSULE(120 MG) BY MOUTH DAILY   No facility-administered encounter medications on file as of 05/21/2022.  Fill History:   Dispensed Days Supply Quantity Provider Pharmacy  AMLODIPINE BESYLATE 5MG TABLETS 03/16/2022 90 90 each      Dispensed Days Supply Quantity Provider Pharmacy  ATORVASTATIN 40MG TABLETS 04/10/2022 90 90 each      Dispensed Days Supply  Quantity Provider Pharmacy  LISINOPRIL 40MG TABLETS 05/03/2022 90 90 each      Dispensed Days Supply Quantity Provider Pharmacy  OMEPRAZOLE 20MG CAPSULES 02/27/2022 90 90 each      Dispensed Days Supply Quantity Provider Pharmacy  PROPRANOLOL ER 120MG CAPSULES 02/26/2022 90 90 each     Reviewed chart prior to disease state call. Spoke with patient regarding BP  Recent Office Vitals: BP Readings from Last 3 Encounters:  01/04/22 124/80  09/29/21 (!) 150/80  05/12/21 128/68   Pulse Readings from Last 3 Encounters:  01/04/22 (!) 58  09/29/21 87  05/12/21 62    Wt Readings from Last 3 Encounters:  05/16/22 190 lb (86.2 kg)  01/04/22 190 lb 3.2 oz (86.3 kg)  09/29/21 194 lb (88 kg)     Kidney Function Lab Results  Component Value Date/Time   CREATININE 1.48 09/29/2021 08:07 AM   CREATININE 1.36 09/27/2020 08:23 AM   GFR 48.38 (L) 09/29/2021 08:07 AM   GFRNONAA 69.80 11/30/2009 08:51 AM       Latest Ref Rng & Units 09/29/2021    8:07 AM 09/27/2020    8:23 AM 09/25/2019    8:33 AM  BMP  Glucose 70 - 99 mg/dL 120  106  104   BUN 6 - 23 mg/dL _0 Creatinine 0.40 - 1.50 mg/dL 1.48  1.36  1.39   Sodium 135 - 145 mEq/L 136  138  134   Potassium 3.5 - 5.1 mEq/L 4.2  4.5  5.1   Chloride 96 - 112 mEq/L 100  103  101   CO2 19 - 32 mEq/L _0 Calcium 8.4 - 10.5 mg/dL 9.6  9.6  9.6     Current antihypertensive regimen:  Amlodipine 5 mg daily Lisinopril 40 mg daily Propranolol 120 mg daily  How often are you checking your Blood Pressure? Patient has been checking weekly.  Current home BP readings: Patient states his reading last week was 122/79.  He states this is where his blood pressure readings are most of the time.  Patient was not able to get a reading at the time of the call.   What recent interventions/DTPs have been made by any provider to improve Blood Pressure control since last CPP Visit: No recent interventions  Any recent hospitalizations or ED  visits since last visit with CPP? No recent hospital visits.  What diet changes have been made to improve Blood Pressure Control?  Patient follows a lower carb diet and sodium Breakfast - patient will have multigrain cheerios Lunch -  Dinner - patient will have a meat, vegetable and starch (he has decreased the portion of starch drastically)  Snack - infrequently either a butterfinger of a few chips  What exercise is being done to improve your Blood Pressure Control?  Patient stays busy all day, taking care of the house inside and outside and works on projects in his garage.   Adherence Review: Is the patient currently on ACE/ARB medication? Yes Does the patient have >5 day gap between last estimated fill dates? No  Care Gaps: AWV - scheduled 05/20/2023 Last BP - 124/80 on 01/04/2022 Last A1C - 5.6 on 01/04/2022 Eye exam - overdue Urine ACR - overdue Lung Cancer Screen - overdue Pneumonia - due soon Colonoscopy - postponed Covid  - postponed Flu - postponed  Star Rating Drugs: Lisinopril 40 mg - last filled 05/03/2022 90 DS at [facility] Atorvastatin 40 mg - last filled 04/10/2022 90 DS at [place] Pharmacist Assistant [phone]

## 2022-06-05 ENCOUNTER — Encounter: Payer: Self-pay | Admitting: *Deleted

## 2022-06-18 ENCOUNTER — Ambulatory Visit (INDEPENDENT_AMBULATORY_CARE_PROVIDER_SITE_OTHER): Payer: Medicare (Managed Care)

## 2022-06-18 DIAGNOSIS — Z23 Encounter for immunization: Secondary | ICD-10-CM | POA: Diagnosis not present

## 2022-08-24 ENCOUNTER — Other Ambulatory Visit: Payer: Self-pay | Admitting: Adult Health

## 2022-08-24 DIAGNOSIS — G250 Essential tremor: Secondary | ICD-10-CM

## 2022-09-05 ENCOUNTER — Telehealth: Payer: Medicare (Managed Care)

## 2022-10-02 ENCOUNTER — Ambulatory Visit (INDEPENDENT_AMBULATORY_CARE_PROVIDER_SITE_OTHER): Payer: Medicare (Managed Care) | Admitting: Adult Health

## 2022-10-02 ENCOUNTER — Encounter: Payer: Self-pay | Admitting: Adult Health

## 2022-10-02 VITALS — BP 110/78 | HR 56 | Temp 98.0°F | Ht 67.0 in | Wt 196.0 lb

## 2022-10-02 DIAGNOSIS — Z720 Tobacco use: Secondary | ICD-10-CM

## 2022-10-02 DIAGNOSIS — I10 Essential (primary) hypertension: Secondary | ICD-10-CM

## 2022-10-02 DIAGNOSIS — K2100 Gastro-esophageal reflux disease with esophagitis, without bleeding: Secondary | ICD-10-CM

## 2022-10-02 DIAGNOSIS — Z0000 Encounter for general adult medical examination without abnormal findings: Secondary | ICD-10-CM

## 2022-10-02 DIAGNOSIS — E785 Hyperlipidemia, unspecified: Secondary | ICD-10-CM

## 2022-10-02 DIAGNOSIS — G250 Essential tremor: Secondary | ICD-10-CM

## 2022-10-02 DIAGNOSIS — Z125 Encounter for screening for malignant neoplasm of prostate: Secondary | ICD-10-CM | POA: Diagnosis not present

## 2022-10-02 DIAGNOSIS — Z23 Encounter for immunization: Secondary | ICD-10-CM

## 2022-10-02 DIAGNOSIS — E1129 Type 2 diabetes mellitus with other diabetic kidney complication: Secondary | ICD-10-CM

## 2022-10-02 LAB — COMPREHENSIVE METABOLIC PANEL
ALT: 14 U/L (ref 0–53)
AST: 16 U/L (ref 0–37)
Albumin: 4.2 g/dL (ref 3.5–5.2)
Alkaline Phosphatase: 90 U/L (ref 39–117)
BUN: 14 mg/dL (ref 6–23)
CO2: 24 mEq/L (ref 19–32)
Calcium: 9.3 mg/dL (ref 8.4–10.5)
Chloride: 103 mEq/L (ref 96–112)
Creatinine, Ser: 1.72 mg/dL — ABNORMAL HIGH (ref 0.40–1.50)
GFR: 40.11 mL/min — ABNORMAL LOW (ref >60.00)
Glucose, Bld: 141 mg/dL — ABNORMAL HIGH (ref 70–99)
Potassium: 4.3 mEq/L (ref 3.5–5.1)
Sodium: 133 mEq/L — ABNORMAL LOW (ref 135–145)
Total Bilirubin: 0.8 mg/dL (ref 0.2–1.2)
Total Protein: 6.9 g/dL (ref 6.0–8.3)

## 2022-10-02 LAB — LIPID PANEL
Cholesterol: 116 mg/dL (ref 0–200)
HDL: 44.1 mg/dL (ref >39.00)
LDL Cholesterol: 55 mg/dL (ref 0–99)
NonHDL: 71.59
Total CHOL/HDL Ratio: 3
Triglycerides: 83 mg/dL (ref 0.0–149.0)
VLDL: 16.6 mg/dL (ref 0.0–40.0)

## 2022-10-02 LAB — TSH: TSH: 1.31 u[IU]/mL (ref 0.35–5.50)

## 2022-10-02 LAB — CBC
HCT: 44.3 % (ref 39.0–52.0)
Hemoglobin: 15.3 g/dL (ref 13.0–17.0)
MCHC: 34.5 g/dL (ref 30.0–36.0)
MCV: 109.2 fl — ABNORMAL HIGH (ref 78.0–100.0)
Platelets: 208 10*3/uL (ref 150.0–400.0)
RBC: 4.06 Mil/uL — ABNORMAL LOW (ref 4.22–5.81)
RDW: 13 % (ref 11.5–15.5)
WBC: 7.3 10*3/uL (ref 4.0–10.5)

## 2022-10-02 LAB — HEMOGLOBIN A1C: Hgb A1c MFr Bld: 5.9 % (ref 4.6–6.5)

## 2022-10-02 LAB — MICROALBUMIN / CREATININE URINE RATIO
Creatinine,U: 146.7 mg/dL
Microalb Creat Ratio: 12.2 mg/g (ref 0.0–30.0)
Microalb, Ur: 17.8 mg/dL — ABNORMAL HIGH (ref 0.0–1.9)

## 2022-10-02 LAB — PSA: PSA: 0.95 ng/mL (ref 0.10–4.00)

## 2022-10-02 MED ORDER — BLOOD GLUCOSE TEST VI STRP: 1.0000 | ORAL_STRIP | Freq: Three times a day (TID) | 0 refills | Status: AC

## 2022-10-02 MED ORDER — LANCET DEVICE MISC: 1.0000 | Freq: Three times a day (TID) | 0 refills | Status: AC

## 2022-10-02 MED ORDER — LANCETS MISC. MISC: 1.0000 | Freq: Three times a day (TID) | 0 refills | Status: AC

## 2022-10-02 MED ORDER — ATORVASTATIN CALCIUM 40 MG PO TABS
ORAL_TABLET | ORAL | 3 refills | Status: DC
Start: 2022-10-02 — End: 2023-10-03

## 2022-10-02 MED ORDER — BLOOD GLUCOSE MONITORING SUPPL DEVI: 0 refills | Status: AC

## 2022-10-02 MED ORDER — OMEPRAZOLE 20 MG PO CPDR
DELAYED_RELEASE_CAPSULE | ORAL | 3 refills | Status: DC
Start: 2022-10-02 — End: 2023-11-22

## 2022-10-02 MED ORDER — AMLODIPINE BESYLATE 5 MG PO TABS
ORAL_TABLET | ORAL | 3 refills | Status: DC
Start: 2022-10-02 — End: 2023-10-03

## 2022-10-02 MED ORDER — BLOOD GLUCOSE TEST VI STRP: 1.0000 | ORAL_STRIP | Freq: Three times a day (TID) | 0 refills | Status: DC

## 2022-10-02 MED ORDER — BLOOD GLUCOSE MONITORING SUPPL DEVI: 1.0000 | Freq: Three times a day (TID) | 0 refills | Status: AC

## 2022-10-02 MED ORDER — LISINOPRIL 40 MG PO TABS
40.0000 mg | ORAL_TABLET | Freq: Every day | ORAL | 3 refills | Status: DC
Start: 2022-10-02 — End: 2022-10-31

## 2022-10-02 NOTE — Addendum Note (Signed)
Addended by: Apolinar Junes on: 10/02/2022 08:26 AM   Modules accepted: Level of Service

## 2022-10-02 NOTE — Patient Instructions (Signed)
It was great seeing you today   We will follow up with you regarding your lab work   Please let me know if you need anything   

## 2022-10-02 NOTE — Progress Notes (Signed)
Subjective:    Patient ID: Richard [name], male    DOB: 03/01/1951, 72 y.o.   MRN: [id number]  HPI Patient presents for yearly preventative medicine examination. He is a pleasant 72 year old male who  has a past medical history of ANEMIA-NOS (12/06/2008), BACK PAIN (02/20/2010), DIABETES MELLITUS, TYPE II (06/01/2008), GERD (06/01/2008), HYPERLIPIDEMIA (06/01/2008), HYPERTENSION (06/01/2008), and Tremors of nervous system.  He reports that his wife had hip surgery and had two strokes post surgery. He is now the primary care giver.   Diabetes mellitus type 2- currently diet controlled. Was able to come off Metformin 250 mg daily about a year ago.  Lab Results  Component Value Date   HGBA1C 5.6 01/04/2022   Hypertension -well-controlled with lisinopril 40 mg daily and Norvasc 5 mg daily.  He denies episodes of dizziness, lightheadedness, chest pain, or shortness of breath  BP Readings from Last 3 Encounters:  10/02/22 110/78  01/04/22 124/80  09/29/21 (!) 150/80   Hyperlipidemia-currently managed with Lipitor 40 mg daily.  He denies myalgia or fatigue Lab Results  Component Value Date   CHOL 123 09/29/2021   HDL 37.70 (L) 09/29/2021   LDLCALC 69 09/29/2021   TRIG 82.0 09/29/2021   CHOLHDL 3 09/29/2021    Tobacco Use -He continues to smoke.   In the past he has tried Wellbutrin and Chantix.  He has quit multiple times in the past but eventually goes back to smoking.  GERD- controlled  omeprazole 20 mg every other day.  Essential tremor-takes propranolol 60 mg daily.  All immunizations and health maintenance protocols were reviewed with the patient and needed orders were placed.  Appropriate screening laboratory values were ordered for the patient including screening of hyperlipidemia, renal function and hepatic function. If indicated by BPH, a PSA was ordered.  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   Review of Systems  Constitutional: Negative.   HENT: Negative.    Eyes: Negative.   Respiratory: Negative.    Cardiovascular: Negative.   Gastrointestinal: Negative.   Endocrine: Negative.   Genitourinary: Negative.   Musculoskeletal:  Positive for arthralgias and back pain.  Skin: Negative.   Allergic/Immunologic: Negative.   Neurological: Negative.   Hematological: Negative.   Psychiatric/Behavioral: Negative.    All other systems reviewed and are negative.  Past Medical History:  Diagnosis Date   ANEMIA-NOS 12/06/2008   BACK PAIN 02/20/2010   DIABETES MELLITUS, TYPE II 06/01/2008   GERD 06/01/2008   HYPERLIPIDEMIA 06/01/2008   HYPERTENSION 06/01/2008   Tremors of nervous system    pt states he has had tremors most of his life in his hands/hereditary    Social History   Socioeconomic History   Marital status: Married    Spouse name: Not on file   Number of children: Not on file   Years of education: Not on file   Highest education level: Not on file  Occupational History   Not  on file  Tobacco Use   Smoking status: Every Day    Packs/day: 2.00    Years: 35.00    Additional pack years: 0.00    Total pack years: 70.00    Types: Cigarettes   Smokeless tobacco: Never   Tobacco comments:    down to 1.5 ppd X8 mos.  plans to quit on upcoming birthday  Vaping Use   Vaping Use: Never used  Substance and Sexual Activity   Alcohol use: Yes    Alcohol/week: 2.0 - 3.0 standard drinks of alcohol    Types: 2 - 3 Standard drinks or equivalent per week    Drug use: No   Sexual activity: Not on file  Other Topics Concern   Not on file  Social History Narrative   Not on file   Social Determinants of Health   Financial Resource Strain: Low Risk  (05/16/2022)   Overall Financial Resource Strain (CARDIA)    Difficulty of Paying Living Expenses: Not hard at all  Food Insecurity: No Food Insecurity (05/16/2022)   Hunger Vital Sign    Worried About Running Out of Food in the Last Year: Never true    Ran Out of Food in the Last Year: Never true  Transportation Needs: No Transportation Needs (05/16/2022)   PRAPARE - Hydrologist (Medical): No    Lack of Transportation (Non-Medical): No  Physical Activity: Inactive (05/16/2022)   Exercise Vital Sign    Days of Exercise per Week: 0 days    Minutes of Exercise per Session: 0 min  Stress: No Stress Concern Present (05/16/2022)   [place]    Feeling of Stress : Not at all  Social Connections: Moderately Isolated (05/16/2022)   Social Connection and Isolation Panel [NHANES]    Frequency of Communication with Friends and Family: More than three times a week    Frequency of Social Gatherings with Friends and Family: More than three times a week    Attends Religious Services: Never    Marine scientist or Organizations: No    Attends Archivist Meetings: Never    Marital Status: Married  Human resources officer Violence: Not At Risk (05/16/2022)   Humiliation, Afraid, Rape, and Kick questionnaire    Fear of Current or Ex-Partner: No    Emotionally Abused: No    Physically Abused: No    Sexually Abused: No    Past Surgical History:  Procedure Laterality Date   COLONOSCOPY     ELBOW BURSA SURGERY     right elbow   ELBOW BURSA SURGERY Left 11/12/2018   Pt had left olecranon bursectomy   HAND SURGERY     2 surgeries on right hand   HAND SURGERY     left hand/removed a bone    Family  History  Problem Relation Age of Onset   Diabetes Sister    Alzheimer's disease Sister    Diabetes Brother    Heart disease Mother    Sudden death Father        Suicide after his wife passed away    Colon cancer Neg Hx     No Known Allergies  Current Outpatient Medications on File Prior to Visit  Medication Sig Dispense Refill   amLODipine (NORVASC) 5 MG tablet Take daily 90 tablet 3   Ascorbic Acid (VITAMIN C) 1000 MG tablet Take 1,000 mg by mouth daily.     atorvastatin (LIPITOR) 40  MG tablet TAKE 1 TABLET(40 MG) BY MOUTH DAILY 90 tablet 3   cholecalciferol (VITAMIN D3) 25 MCG (1000 UNIT) tablet Take 1,000 Units by mouth daily.     ibuprofen (ADVIL) 200 MG tablet Take 200 mg by mouth 3 (three) times daily.     Lancets (FREESTYLE) lancets Use as instructed 100 each 12   lisinopril (ZESTRIL) 40 MG tablet Take 1 tablet (40 mg total) by mouth daily. 90 tablet 3   nicotine (NICODERM CQ - DOSED IN MG/24 HOURS) 21 mg/24hr patch Place 21 mg onto the skin daily.     omeprazole (PRILOSEC) 20 MG capsule TAKE 1 CAPSULE(20 MG) BY MOUTH DAILY 90 capsule 3   propranolol ER (INDERAL LA) 120 MG 24 hr capsule TAKE 1 CAPSULE(120 MG) BY MOUTH DAILY 90 capsule 1   No current facility-administered medications on file prior to visit.    BP 110/78   Pulse (!) 56   Temp 98 F (36.7 C) (Oral)   Ht 5\' 7"  (1.702 m)   Wt 196 lb (88.9 kg)   SpO2 98%   BMI 30.70 kg/m       Objective:   Physical Exam Vitals and nursing note reviewed.  Constitutional:      General: He is not in acute distress.    Appearance: Normal appearance. He is obese. He is not ill-appearing.  HENT:     Head: Normocephalic and atraumatic.     Right Ear: Tympanic membrane, ear canal and external ear normal. There is no impacted cerumen.     Left Ear: Tympanic membrane, ear canal and external ear normal. There is no impacted cerumen.     Nose: Nose normal. No congestion or rhinorrhea.     Mouth/Throat:     Mouth: Mucous  membranes are moist.     Pharynx: Oropharynx is clear.  Eyes:     Extraocular Movements: Extraocular movements intact.     Conjunctiva/sclera: Conjunctivae normal.     Pupils: Pupils are equal, round, and reactive to light.  Neck:     Vascular: No carotid bruit.  Cardiovascular:     Rate and Rhythm: Normal rate and regular rhythm.     Pulses: Normal pulses.     Heart sounds: No murmur heard.    No friction rub. No gallop.  Pulmonary:     Effort: Pulmonary effort is normal.     Breath sounds: Normal breath sounds.  Abdominal:     General: Abdomen is flat. Bowel sounds are normal. There is no distension.     Palpations: Abdomen is soft. There is no mass.     Tenderness: There is no abdominal tenderness. There is no guarding or rebound.     Hernia: No hernia is present.  Musculoskeletal:        General: Normal range of motion.     Cervical back: Normal range of motion and neck supple.  Lymphadenopathy:     Cervical: No cervical adenopathy.  Skin:    General: Skin is warm and dry.     Capillary Refill: Capillary refill takes less than 2 seconds.  Neurological:     General: No focal deficit present.     Mental Status: He is alert and oriented to person, place, and time.  Psychiatric:        Mood and Affect: Mood normal.        Behavior: Behavior normal.        Thought Content: Thought content normal.  Judgment: Judgment normal.       Assessment & Plan:  1. Routine general medical examination at a health care facility Today patient counseled on age appropriate routine health concerns for screening and prevention, each reviewed and up to date or declined. Immunizations reviewed and up to date or declined. Labs ordered and reviewed. Risk factors for depression reviewed and negative. Hearing function and visual acuity are intact. ADLs screened and addressed as needed. Functional ability and level of safety reviewed and appropriate. Education, counseling and referrals performed  based on assessed risks today. Patient provided with a copy of personalized plan for preventive services. - Prevnar 20 given today   2. Type 2 diabetes mellitus with other diabetic kidney complication, without long-term current use of insulin - Consider going back on Metformin  - Encouraged heart healthy diet and exercise - 3-6 month follow up depending on A1c result  - Lipid panel; Future - TSH; Future - CBC; Future - Comprehensive metabolic panel; Future - Hemoglobin A1c; Future - Microalbumin/Creatinine Ratio, Urine; Future  3. Essential hypertension - Well controlled. No change in medication  - Lipid panel; Future - TSH; Future - CBC; Future - Comprehensive metabolic panel; Future - Hemoglobin A1c; Future - amLODipine (NORVASC) 5 MG tablet; Take daily  Dispense: 90 tablet; Refill: 3 - lisinopril (ZESTRIL) 40 MG tablet; Take 1 tablet (40 mg total) by mouth daily.  Dispense: 90 tablet; Refill: 3  4. Dyslipidemia - Continue with Lipitor 40 mg  - Lipid panel; Future - TSH; Future - CBC; Future - Comprehensive metabolic panel; Future - Hemoglobin A1c; Future - atorvastatin (LIPITOR) 40 MG tablet; TAKE 1 TABLET(40 MG) BY MOUTH DAILY  Dispense: 90 tablet; Refill: 3  5. Essential tremor - Continue with Inderal 60 mg  - Lipid panel; Future - TSH; Future - CBC; Future - Comprehensive metabolic panel; Future - Hemoglobin A1c; Future  6. Tobacco use - encouaged to quit smoking  - Lipid panel; Future - TSH; Future - CBC; Future - Comprehensive metabolic panel; Future - Hemoglobin A1c; Future - CT CHEST LUNG CA SCREEN LOW DOSE W/O CM; Future  7. Prostate cancer screening  - PSA; Future  8. Gastroesophageal reflux disease with esophagitis without hemorrhage - Continue PPI  - Lipid panel; Future - TSH; Future - CBC; Future - Comprehensive metabolic panel; Future - Hemoglobin A1c; Future - omeprazole (PRILOSEC) 20 MG capsule; TAKE 1 CAPSULE(20 MG) BY MOUTH DAILY   Dispense: 90 capsule; Refill: 3  [name], NP

## 2022-10-03 ENCOUNTER — Telehealth: Payer: Self-pay | Admitting: Adult Health

## 2022-10-03 ENCOUNTER — Other Ambulatory Visit: Payer: Self-pay | Admitting: Adult Health

## 2022-10-03 DIAGNOSIS — N1832 Chronic kidney disease, stage 3b: Secondary | ICD-10-CM

## 2022-10-03 DIAGNOSIS — I10 Essential (primary) hypertension: Secondary | ICD-10-CM

## 2022-10-03 MED ORDER — BLOOD GLUCOSE TEST VI STRP: 1.0000 | ORAL_STRIP | Freq: Three times a day (TID) | 0 refills | Status: AC

## 2022-10-03 NOTE — Telephone Encounter (Signed)
Test strips sent to pharmacy again

## 2022-10-03 NOTE — Telephone Encounter (Signed)
Pt is calling and need a new rx for accu chek guide me meter testing strips. Pt does not know how many times he suppose to check his BS . Please send in new rx for testing strips  [facility] [place], [place] Phone: [phone]  Fax: [phone]

## 2022-10-04 ENCOUNTER — Other Ambulatory Visit: Payer: Self-pay

## 2022-10-07 ENCOUNTER — Other Ambulatory Visit: Payer: Self-pay | Admitting: Adult Health

## 2022-10-07 DIAGNOSIS — E785 Hyperlipidemia, unspecified: Secondary | ICD-10-CM

## 2022-10-08 ENCOUNTER — Telehealth: Payer: Self-pay | Admitting: Adult Health

## 2022-10-08 NOTE — Telephone Encounter (Signed)
Patient checking to see if he should have gotten Jardiance that was discussed at his previous appointment. No prescription has been called in. Also wants to confirm if he needs to come back for a follow up

## 2022-10-09 ENCOUNTER — Other Ambulatory Visit: Payer: Self-pay

## 2022-10-09 MED ORDER — EMPAGLIFLOZIN 10 MG PO TABS: 10.0000 mg | ORAL_TABLET | Freq: Every day | ORAL | 1 refills | Status: DC

## 2022-10-09 NOTE — Telephone Encounter (Signed)
This was taking care of. °

## 2022-10-10 ENCOUNTER — Other Ambulatory Visit: Payer: Self-pay | Admitting: Adult Health

## 2022-10-10 ENCOUNTER — Telehealth: Payer: Self-pay | Admitting: Adult Health

## 2022-10-10 MED ORDER — DAPAGLIFLOZIN PROPANEDIOL 5 MG PO TABS: 5.0000 mg | ORAL_TABLET | Freq: Every day | ORAL | 2 refills | Status: DC

## 2022-10-10 NOTE — Telephone Encounter (Signed)
Patient states the prescription for empagliflozin (JARDIANCE) 10 MG TABS tablet is too expensive (376/month) asking if there is an alternate or if there are samples.

## 2022-10-10 NOTE — Telephone Encounter (Signed)
Please advise 

## 2022-10-10 NOTE — Telephone Encounter (Signed)
Patient notified of update  and verbalized understanding. 

## 2022-10-12 MED ORDER — DAPAGLIFLOZIN PROPANEDIOL 5 MG PO TABS: 5.0000 mg | ORAL_TABLET | Freq: Every day | ORAL | 2 refills | Status: DC

## 2022-10-12 NOTE — Telephone Encounter (Signed)
Pt went to the pharmacy to pick up this Rx and they told him it would cost $292.  Pt stated he will not be picking up this Rx.  Pt is asking for any assistance NP could provide to help him get this Rx.  Pt is asking:  Will NP send a more affordable alternative? Should Pt try a different pharmacy?  Please advise.

## 2022-10-12 NOTE — Telephone Encounter (Signed)
Patient notified of update  and verbalized understanding. 

## 2022-10-12 NOTE — Telephone Encounter (Signed)
Rx sent to pharmacy again.  

## 2022-10-12 NOTE — Addendum Note (Signed)
Addended by: Waymon Amato R on: 10/12/2022 08:47 AM   Modules accepted: Orders

## 2022-10-12 NOTE — Telephone Encounter (Signed)
Pt called to say the pharmacy is telling him they cannot find the Rx for the dapagliflozin propanediol (FARXIGA) 5 MG TABS tablet .  Please advise.   [facility] #[id number] [place], [place] - [address] AT [facility] [address] Phone: [phone]  Fax: [phone]

## 2022-10-28 ENCOUNTER — Other Ambulatory Visit: Payer: Self-pay | Admitting: Adult Health

## 2022-10-28 DIAGNOSIS — I10 Essential (primary) hypertension: Secondary | ICD-10-CM

## 2022-10-29 ENCOUNTER — Telehealth: Payer: Self-pay | Admitting: Adult Health

## 2022-10-29 NOTE — Telephone Encounter (Addendum)
Pt called to say the insurance company needs the PA for the test strips and the meter.  LOV:  10/02/22  Pt informed NP is OOO on Mondays.  Please advise.   [facility] DR[facility] #[id number] [place], [place] - [address] AT [facility] [address] Phone: [phone]  Fax: [phone]

## 2022-10-30 NOTE — Telephone Encounter (Signed)
Routing to  PA team

## 2022-11-06 ENCOUNTER — Other Ambulatory Visit (INDEPENDENT_AMBULATORY_CARE_PROVIDER_SITE_OTHER): Payer: Medicare (Managed Care)

## 2022-11-06 DIAGNOSIS — N1832 Chronic kidney disease, stage 3b: Secondary | ICD-10-CM | POA: Diagnosis not present

## 2022-11-06 LAB — BASIC METABOLIC PANEL
BUN: 14 mg/dL (ref 6–23)
CO2: 27 mEq/L (ref 19–32)
Calcium: 9.4 mg/dL (ref 8.4–10.5)
Chloride: 100 mEq/L (ref 96–112)
Creatinine, Ser: 1.52 mg/dL — ABNORMAL HIGH (ref 0.40–1.50)
GFR: 46.49 mL/min — ABNORMAL LOW (ref >60.00)
Glucose, Bld: 159 mg/dL — ABNORMAL HIGH (ref 70–99)
Potassium: 4.2 mEq/L (ref 3.5–5.1)
Sodium: 133 mEq/L — ABNORMAL LOW (ref 135–145)

## 2022-11-09 ENCOUNTER — Other Ambulatory Visit: Payer: Medicare (Managed Care)

## 2023-01-29 ENCOUNTER — Telehealth: Payer: Self-pay | Admitting: Adult Health

## 2023-01-29 DIAGNOSIS — G250 Essential tremor: Secondary | ICD-10-CM

## 2023-01-29 MED ORDER — PROPRANOLOL HCL ER 120 MG PO CP24
120.0000 mg | ORAL_CAPSULE | Freq: Every day | ORAL | 1 refills | Status: DC
Start: 2023-01-29 — End: 2023-08-05

## 2023-01-29 NOTE — Telephone Encounter (Signed)
Prescription sent to pharmacy. No other action needed.  

## 2023-01-29 NOTE — Telephone Encounter (Signed)
Prescription Request  01/29/2023  LOV: 10/02/2022  What is the name of the medication or equipment? propranolol ER (INDERAL LA) 120 MG 24 hr capsule   Have you contacted your pharmacy to request a refill? Yes   Which pharmacy would you like this sent to?  [facility] #[id number] - [place], [place] - [address] AT [facility] [address] [place] [address] Phone: [phone] Fax: [phone]     Patient notified that their request is being sent to the clinical staff for review and that they should receive a response within 2 business days.   Please advise at [facility] [phone]

## 2023-05-09 ENCOUNTER — Ambulatory Visit: Payer: Medicare (Managed Care) | Admitting: Adult Health

## 2023-05-09 ENCOUNTER — Encounter: Payer: Self-pay | Admitting: Adult Health

## 2023-05-09 VITALS — BP 120/82 | HR 70 | Temp 98.1°F | Ht 67.0 in | Wt 188.0 lb

## 2023-05-09 DIAGNOSIS — I10 Essential (primary) hypertension: Secondary | ICD-10-CM

## 2023-05-09 DIAGNOSIS — Z23 Encounter for immunization: Secondary | ICD-10-CM | POA: Diagnosis not present

## 2023-05-09 DIAGNOSIS — E1129 Type 2 diabetes mellitus with other diabetic kidney complication: Secondary | ICD-10-CM | POA: Diagnosis not present

## 2023-05-09 LAB — POCT GLYCOSYLATED HEMOGLOBIN (HGB A1C): Hemoglobin A1C: 5.7 % — AB (ref 4.0–5.6)

## 2023-05-09 NOTE — Progress Notes (Signed)
Subjective:    Patient ID: Richard [name], male    DOB: 07-Jun-1951, 72 y.o.   MRN: [id number]  HPI  72 year old male who  has a past medical history of ANEMIA-NOS (12/06/2008), BACK PAIN (02/20/2010), DIABETES MELLITUS, TYPE II (06/01/2008), GERD (06/01/2008), HYPERLIPIDEMIA (06/01/2008), HYPERTENSION (06/01/2008), and Tremors of nervous system.  He presents to the office today for follow up regarding DM and HTN   Diabetes mellitus type 2- He has been diet controlled. During the last visit I had sent in Jardiance for kidney protection but it was too expensive. I then sent in faxiga but this was not approved either.  He has been checking his blood sugar at home with readings in the 120 range..  Lab Results  Component Value Date   HGBA1C 5.7 (A) 05/09/2023   HGBA1C 5.9 10/02/2022   HGBA1C 5.6 01/04/2022   Hypertension -well-controlled with lisinopril 40 mg daily and Norvasc 5 mg daily.  He denies episodes of dizziness, lightheadedness, chest pain, or shortness of breath BP Readings from Last 3 Encounters:  05/09/23 120/82  10/02/22 110/78  01/04/22 124/80    Review of Systems See HPI   Past Medical History:  Diagnosis Date   ANEMIA-NOS 12/06/2008   BACK PAIN 02/20/2010   DIABETES MELLITUS, TYPE II 06/01/2008   GERD 06/01/2008   HYPERLIPIDEMIA 06/01/2008   HYPERTENSION 06/01/2008   Tremors of nervous system    pt states he has had tremors most of his life in his hands/hereditary    Social History   Socioeconomic History   Marital status: Married    Spouse name: Not on file   Number of children: Not on file   Years of education: Not on file   Highest education level: Not on file  Occupational History   Not on file  Tobacco Use   Smoking status: Every Day    Current packs/day: 2.00    Average packs/day: 2.0 packs/day for 35.0 years (70.0 ttl pk-yrs)    Types: Cigarettes   Smokeless tobacco: Never   Tobacco comments:    down to 1.5 ppd X8 mos.  plans to quit on upcoming  birthday  Vaping Use   Vaping status: Never Used  Substance and Sexual Activity   Alcohol use: Yes    Alcohol/week: 2.0 - 3.0 standard drinks of alcohol    Types: 2 - 3 Standard drinks or equivalent per week   Drug use: No   Sexual activity: Not on file  Other Topics Concern   Not on file  Social History Narrative   Not on file   Social Determinants of Health   Financial Resource Strain: Low Risk  (05/16/2022)   Overall Financial Resource Strain (CARDIA)    Difficulty of Paying Living Expenses: Not hard at all  Food Insecurity: No Food Insecurity (05/16/2022)   Hunger Vital Sign    Worried About Running Out of Food in the Last Year: Never true    Ran Out of Food in the Last Year: Never true  Transportation Needs: No Transportation Needs (05/16/2022)   PRAPARE - Administrator, Civil Service (Medical): No    Lack of Transportation (Non-Medical): No  Physical Activity: Inactive (05/16/2022)   Exercise Vital Sign    Days of Exercise per Week: 0 days    Minutes of Exercise per Session: 0 min  Stress: No Stress Concern Present (05/16/2022)   [employer] of Occupational Health - Occupational Stress Questionnaire    Feeling of Stress :  Not at all  Social Connections: Moderately Isolated (05/16/2022)   Social Connection and Isolation Panel [NHANES]    Frequency of Communication with Friends and Family: More than three times a week    Frequency of Social Gatherings with Friends and Family: More than three times a week    Attends Religious Services: Never    Database administrator or Organizations: No    Attends Banker Meetings: Never    Marital Status: Married  Catering manager Violence: Not At Risk (05/16/2022)   Humiliation, Afraid, Rape, and Kick questionnaire    Fear of Current or Ex-Partner: No    Emotionally Abused: No    Physically Abused: No    Sexually Abused: No    Past Surgical History:  Procedure Laterality Date   COLONOSCOPY      ELBOW BURSA SURGERY     right elbow   ELBOW BURSA SURGERY Left 11/12/2018   Pt had left olecranon bursectomy   HAND SURGERY     2 surgeries on right hand   HAND SURGERY     left hand/removed a bone    Family History  Problem Relation Age of Onset   Diabetes Sister    Alzheimer's disease Sister    Diabetes Brother    Heart disease Mother    Sudden death Father        Suicide after his wife passed away    Colon cancer Neg Hx     No Known Allergies  Current Outpatient Medications on File Prior to Visit  Medication Sig Dispense Refill   amLODipine (NORVASC) 5 MG tablet Take daily 90 tablet 3   Ascorbic Acid (VITAMIN C) 1000 MG tablet Take 1,000 mg by mouth daily.     atorvastatin (LIPITOR) 40 MG tablet TAKE 1 TABLET(40 MG) BY MOUTH DAILY 90 tablet 3   Blood Glucose Monitoring Suppl DEVI Use to check blood glucose TID 1 each 0   Blood Glucose Monitoring Suppl DEVI 1 each by Does not apply route in the morning, at noon, and at bedtime. May substitute to any manufacturer covered by patient's insurance. 1 each 0   cholecalciferol (VITAMIN D3) 25 MCG (1000 UNIT) tablet Take 1,000 Units by mouth daily.     dapagliflozin propanediol (FARXIGA) 5 MG TABS tablet Take 1 tablet (5 mg total) by mouth daily before breakfast. 30 tablet 2   ibuprofen (ADVIL) 200 MG tablet Take 200 mg by mouth 3 (three) times daily.     Lancets (FREESTYLE) lancets Use as instructed 100 each 12   lisinopril (ZESTRIL) 40 MG tablet TAKE 1 TABLET(40 MG) BY MOUTH DAILY 90 tablet 3   nicotine (NICODERM CQ - DOSED IN MG/24 HOURS) 21 mg/24hr patch Place 21 mg onto the skin daily.     omeprazole (PRILOSEC) 20 MG capsule TAKE 1 CAPSULE(20 MG) BY MOUTH DAILY 90 capsule 3   propranolol ER (INDERAL LA) 120 MG 24 hr capsule Take 1 capsule (120 mg total) by mouth daily. TAKE 1 CAPSULE(120 MG) BY MOUTH DAILY 90 capsule 1   No current facility-administered medications on file prior to visit.    BP 120/82   Pulse 70   Temp 98.1  F (36.7 C) (Oral)   Ht 5\' 7"  (1.702 m)   Wt 188 lb (85.3 kg)   SpO2 97%   BMI 29.44 kg/m       Objective:   Physical Exam Vitals and nursing note reviewed.  Constitutional:      Appearance: Normal appearance.  He is obese.  Cardiovascular:     Rate and Rhythm: Normal rate and regular rhythm.     Pulses: Normal pulses.     Heart sounds: Normal heart sounds.  Pulmonary:     Effort: Pulmonary effort is normal.     Breath sounds: Normal breath sounds.  Skin:    General: Skin is warm and dry.  Neurological:     General: No focal deficit present.     Mental Status: He is alert and oriented to person, place, and time.  Psychiatric:        Mood and Affect: Mood normal.        Behavior: Behavior normal.        Thought Content: Thought content normal.        Judgment: Judgment normal.       Assessment & Plan:   1. Type 2 diabetes mellitus with other diabetic kidney complication, without long-term current use of insulin (HCC)  - POC HgB A1c- 5.7 - at goal - Encouraged weight loss through diet and exercise  - Follow up in 6 months for CPE   2. Essential hypertension - Well controlled. No change in medication   3. Need for influenza vaccination  - Flu Vaccine Trivalent High Dose (Fluad)

## 2023-08-03 ENCOUNTER — Other Ambulatory Visit: Payer: Self-pay | Admitting: Adult Health

## 2023-08-03 DIAGNOSIS — G250 Essential tremor: Secondary | ICD-10-CM

## 2023-08-07 ENCOUNTER — Telehealth: Payer: Self-pay

## 2023-08-07 NOTE — Telephone Encounter (Signed)
 Copied from CRM [id number]. Topic: Clinical - Prescription Issue >> Aug 07, 2023 11:29 AM Pinkey ORN wrote: Reason for CRM: propranolol  ER (INDERAL  LA) 120 MG 24 hr capsule >> Aug 07, 2023 11:30 AM Pinkey ORN wrote: Patient states the pharmacy keeps delaying the refill request for propranolol  ER (INDERAL  LA) 120 MG 24 hr capsule  because they're waiting for authorization from the provider.

## 2023-08-07 NOTE — Telephone Encounter (Signed)
 Left message to return phone call.

## 2023-08-07 NOTE — Telephone Encounter (Deleted)
 Patient notified of update  and verbalized understanding.

## 2023-08-09 NOTE — Telephone Encounter (Signed)
 Spoke to pharmacist and pt was able to get medication.

## 2023-09-19 ENCOUNTER — Ambulatory Visit: Admitting: Family Medicine

## 2023-09-19 ENCOUNTER — Encounter: Payer: Self-pay | Admitting: Family Medicine

## 2023-09-19 DIAGNOSIS — Z0000 Encounter for general adult medical examination without abnormal findings: Secondary | ICD-10-CM | POA: Diagnosis not present

## 2023-09-19 NOTE — Progress Notes (Signed)
 PATIENT CHECK-IN and HEALTH RISK ASSESSMENT QUESTIONNAIRE:  -completed by phone/video for upcoming Medicare Preventive Visit  Pre-Visit Check-in: 1)Vitals (height, wt, BP, etc) - record in vitals section for visit on day of visit Request home vitals (wt, BP, etc.) and enter into vitals, THEN update Vital Signs SmartPhrase below at the top of the HPI. See below.  2)Review and Update Medications, Allergies PMH, Surgeries, Social history in Epic 3)Hospitalizations in the last year with date/reason? No  4)Review and Update Care Team (patient's specialists) in Epic 5) Complete PHQ9 in Epic  6) Complete Fall Screening in Epic 7)Review all Health Maintenance Due and order under PCP if not done.  Medicare Wellness Patient Questionnaire:  Answer theses question about your habits: How often do you have a drink containing alcohol? daily How many drinks containing alcohol do you have on a typical day when you are drinking?2-4 drinks How often do you have six or more drinks on one occasion? Once/month Have you ever smoked?Yes     How many packs a day do/did you smoke? 2 packs  Do you use smokeless tobacco? No Do you use an illicit drugs? No On average, how many days per week do you engage in moderate to strenuous exercise (like a brisk walk)?No, but is very active taking care wife, home and yard - one his feet a lot. On average, how many minutes do you engage in exercise at this level?No Are you sexually active? No Number of partners?  Reports cooks at home, tries to get balanced meals proteins and veggies Typical breakfast: Varies  Typical lunchVaries  Typical dinnerVaries  Typical snacks: N/A, occ some deserts every now and then  Beverages: admits does not drink enough water, Pepsi   Answer theses question about your everyday activities: Can you perform most household chores? Yes  Are you deaf or have significant trouble hearing? Yes, has hearing aids  Do you feel that you have a problem  with memory? Yes  Do you feel safe at home? Yes  Last dentist visit? 2 years ago  8. Do you have any difficulty performing your everyday activities?No Are you having any difficulty walking, taking medications on your own, and or difficulty managing daily home needs?No Do you have difficulty walking or climbing stairs?No Do you have difficulty dressing or bathing?No Do you have difficulty doing errands alone such as visiting a doctor's office or shopping?No Do you currently have any difficulty preparing food and eating?No Do you currently have any difficulty using the toilet? No Do you have any difficulty managing your finances?No Do you have any difficulties with housekeeping of managing your housekeeping?No   Do you have Advanced Directives in place (Living Will, Healthcare Power or Attorney)?  No   Last eye Exam and location?   reports has not gone in 2 years, [place] [facility]   Do you currently use prescribed or non-prescribed narcotic or opioid pain medications? No  Do you have a history or close family history of breast, ovarian, tubal or peritoneal cancer or a family member with BRCA (breast cancer susceptibility 1 and 2) gene mutations? No     Nurse/Assistant Credentials/time stamp: MG 11:30 AM    ----------------------------------------------------------------------------------------------------------------------------------------------------------------------------------------------------------------------  Because this visit was a virtual/telehealth visit, some criteria may be missing or patient reported. Any vitals not documented were not able to be obtained and vitals that have been documented are patient reported.    MEDICARE ANNUAL PREVENTIVE CARE VISIT WITH PROVIDER (Welcome to Medicare, initial annual wellness or annual wellness  exam)  Virtual Visit via Phone Note  I connected with Richard [name] on 09/19/23  by phone and verified that I am speaking with the correct  person using two identifiers.He declined video visit and preferred to talk by phone.   Location patient: home Location provider:work or home office Persons participating in the virtual visit: patient, provider  Concerns and/or follow up today:  stable   has a past medical history of ANEMIA-NOS (12/06/2008), BACK PAIN (02/20/2010), DIABETES MELLITUS, TYPE II (06/01/2008), GERD (06/01/2008), HYPERLIPIDEMIA (06/01/2008), HYPERTENSION (06/01/2008), and Tremors of nervous system.    See HM section in Epic for other details of completed HM.    ROS: negative for report of fevers, unintentional weight loss, vision changes, vision loss, hearing loss or change, chest pain, sob, hemoptysis, melena, hematochezia, hematuria, falls, bleeding or bruising, thoughts of suicide or self harm, memory loss  Patient-completed extensive health risk assessment - reviewed and discussed with the patient: See Health Risk Assessment completed with patient prior to the visit either above or in recent phone note. This was reviewed in detailed with the patient today and appropriate recommendations, orders and referrals were placed as needed per Summary below and patient instructions.   Review of Medical History: -PMH, PSH, Family History and current specialty and care providers reviewed and updated and listed below   Patient Care Team: Shirline Frees, NP as PCP - General (Family Medicine) Bradly Bienenstock, MD as Consulting Physician (Orthopedic Surgery) Verner Chol, [facility] (Inactive) as Pharmacist (Pharmacist)   Past Medical History:  Diagnosis Date   ANEMIA-NOS 12/06/2008   BACK PAIN 02/20/2010   DIABETES MELLITUS, TYPE II 06/01/2008   GERD 06/01/2008   HYPERLIPIDEMIA 06/01/2008   HYPERTENSION 06/01/2008   Tremors of nervous system    pt states he has had tremors most of his life in his hands/hereditary    Past Surgical History:  Procedure Laterality Date   COLONOSCOPY     ELBOW BURSA SURGERY     right elbow    ELBOW BURSA SURGERY Left 11/12/2018   Pt had left olecranon bursectomy   HAND SURGERY     2 surgeries on right hand   HAND SURGERY     left hand/removed a bone    Social History   Socioeconomic History   Marital status: Married    Spouse name: Not on file   Number of children: Not on file   Years of education: Not on file   Highest education level: Not on file  Occupational History   Not on file  Tobacco Use   Smoking status: Every Day    Current packs/day: 2.00    Average packs/day: 2.0 packs/day for 35.0 years (70.0 ttl pk-yrs)    Types: Cigarettes   Smokeless tobacco: Never   Tobacco comments:    down to 1.5 ppd X8 mos.  plans to quit on upcoming birthday  Vaping Use   Vaping status: Never Used  Substance and Sexual Activity   Alcohol use: Yes    Alcohol/week: 2.0 - 3.0 standard drinks of alcohol    Types: 2 - 3 Standard drinks or equivalent per week   Drug use: No   Sexual activity: Not on file  Other Topics Concern   Not on file  Social History Narrative   Not on file   Social Drivers of Health   Financial Resource Strain: Low Risk  (09/19/2023)   Overall Financial Resource Strain (CARDIA)    Difficulty of Paying Living Expenses: Not very hard  Food Insecurity: No Food Insecurity (09/19/2023)   Hunger Vital Sign    Worried About Running Out of Food in the Last Year: Never true    Ran Out of Food in the Last Year: Never true  Transportation Needs: No Transportation Needs (09/19/2023)   PRAPARE - Administrator, Civil Service (Medical): No    Lack of Transportation (Non-Medical): No  Physical Activity: Inactive (09/19/2023)   Exercise Vital Sign    Days of Exercise per Week: 0 days    Minutes of Exercise per Session: 0 min  Stress: No Stress Concern Present (09/19/2023)   [employer] of Occupational Health - Occupational Stress Questionnaire    Feeling of Stress : Not at all  Social Connections: Moderately Isolated (09/19/2023)   Social  Connection and Isolation Panel [NHANES]    Frequency of Communication with Friends and Family: Three times a week    Frequency of Social Gatherings with Friends and Family: Once a week    Attends Religious Services: Never    Database administrator or Organizations: No    Attends Banker Meetings: Never    Marital Status: Married  Catering manager Violence: Not At Risk (09/19/2023)   Humiliation, Afraid, Rape, and Kick questionnaire    Fear of Current or Ex-Partner: No    Emotionally Abused: No    Physically Abused: No    Sexually Abused: No    Family History  Problem Relation Age of Onset   Diabetes Sister    Alzheimer's disease Sister    Diabetes Brother    Heart disease Mother    Sudden death Father        Suicide after his wife passed away    Colon cancer Neg Hx     Current Outpatient Medications on File Prior to Visit  Medication Sig Dispense Refill   amLODipine (NORVASC) 5 MG tablet Take daily 90 tablet 3   Ascorbic Acid (VITAMIN C) 1000 MG tablet Take 1,000 mg by mouth daily.     atorvastatin (LIPITOR) 40 MG tablet TAKE 1 TABLET(40 MG) BY MOUTH DAILY 90 tablet 3   Blood Glucose Monitoring Suppl DEVI Use to check blood glucose TID 1 each 0   Blood Glucose Monitoring Suppl DEVI 1 each by Does not apply route in the morning, at noon, and at bedtime. May substitute to any manufacturer covered by patient's insurance. 1 each 0   cholecalciferol (VITAMIN D3) 25 MCG (1000 UNIT) tablet Take 1,000 Units by mouth daily.     Lancets (FREESTYLE) lancets Use as instructed 100 each 12   lisinopril (ZESTRIL) 40 MG tablet TAKE 1 TABLET(40 MG) BY MOUTH DAILY 90 tablet 3   omeprazole (PRILOSEC) 20 MG capsule TAKE 1 CAPSULE(20 MG) BY MOUTH DAILY (Patient taking differently: TAKE 1 CAPSULE(20 MG) BY MOUTH every other day) 90 capsule 3   propranolol ER (INDERAL LA) 120 MG 24 hr capsule TAKE 1 CAPSULE(120 MG) BY MOUTH DAILY 90 capsule 1   nicotine (NICODERM CQ - DOSED IN MG/24 HOURS)  21 mg/24hr patch Place 21 mg onto the skin daily. (Patient not taking: Reported on 09/19/2023)     No current facility-administered medications on file prior to visit.    No Known Allergies     Physical Exam Vitals requested from patient and listed below if patient had equipment and was able to obtain at home for this virtual visit: There were no vitals filed for this visit. Estimated body mass index is 29.44 kg/m as  calculated from the following:   Height as of 05/09/23: 5\' 7"  (1.702 m).   Weight as of 05/09/23: 188 lb (85.3 kg).  EKG (optional): deferred due to virtual visit  GENERAL: alert, oriented, no acute distress detected; full vision exam deferred due to pandemic and/or virtual encounter  MS: moves all visible extremities without noticeable abnormality  PSYCH/NEURO: pleasant and cooperative, no obvious depression or anxiety, speech and thought processing grossly intact, Cognitive function grossly intact  Flowsheet Row Clinical Support from 09/19/2023 in [facility] [place]  PHQ-9 Total Score 0           09/19/2023   11:21 AM 10/02/2022    8:02 AM 05/16/2022   12:44 PM 01/04/2022    8:32 AM 05/12/2021    8:40 AM  Depression screen PHQ 2/9  Decreased Interest 0 0 0 0 0  Down, Depressed, Hopeless 0 0 0 1 0  PHQ - 2 Score 0 0 0 1 0  Altered sleeping 0 0  0   Tired, decreased energy 0 0  0   Change in appetite 0 0  0   Feeling bad or failure about yourself  0 0  0   Trouble concentrating 0 0  0   Moving slowly or fidgety/restless 0 0  0   Suicidal thoughts 0 0  0   PHQ-9 Score 0 0  1   Difficult doing work/chores Not difficult at all Not difficult at all  Not difficult at all        05/12/2021    8:45 AM 01/04/2022    8:31 AM 05/16/2022   12:47 PM 10/02/2022    8:01 AM 09/19/2023   11:22 AM  Fall Risk  Falls in the past year? 0 0 0 0 0  Was there an injury with Fall? 0 0 0 0 0  Fall Risk Category Calculator 0 0 0 0 0  Fall Risk Category  (Retired) Low Low Low    (RETIRED) Patient Fall Risk Level Low fall risk Low fall risk Low fall risk    Patient at Risk for Falls Due to  No Fall Risks No Fall Risks No Fall Risks No Fall Risks  Fall risk Follow up  Falls prevention discussed Falls prevention discussed Falls evaluation completed Falls evaluation completed     SUMMARY AND PLAN:  Encounter for Medicare annual wellness exam  Discussed applicable health maintenance/preventive health measures and advised and referred or ordered per patient preferences: -advised and offered to order colonoscopy and lung ca screening, he declined for now as reports is too busy with caring for wife. Gave him GI number and Herbert Deaner numbers so that if things settle down he can call.  -advised eye exam due and he says he will call his eye doctor as soon as he can -advised of other measures due and he prefers to do vaccine at the pharmacy, advise to bring copy of receipt to office so that we can update record Health Maintenance  Topic Date Due   OPHTHALMOLOGY EXAM  04/22/2012   DTaP/Tdap/Td (2 - Tdap) 07/03/2015   Lung Cancer Screening  09/02/2020   Colonoscopy  07/09/2021   COVID-19 Vaccine (4 - 2024-25 season) 03/03/2023   Diabetic kidney evaluation - Urine ACR  10/02/2023   FOOT EXAM  10/02/2023   Diabetic kidney evaluation - eGFR measurement  11/06/2023   HEMOGLOBIN A1C  11/06/2023   Medicare Annual Wellness (AWV)  09/18/2024   Pneumonia Vaccine 66+ Years old  Completed   INFLUENZA VACCINE  Completed   Hepatitis C Screening  Completed   Zoster Vaccines- Shingrix  Completed   HPV VACCINES  Aged [employer] and counseling on the following was provided based on the above review of health and a plan/checklist for the patient, along with additional information discussed, was provided for the patient in the patient instructions :  -Advised on importance of completing advanced directives, discussed options for completing and provided  information in patient instructions as well -Provided counseling and plan for difficulty hearing  -Advised and counseled on a healthy lifestyle - including the importance of a healthy diet, regular physical activity, social connections and stress management. -Reviewed patient's current diet. Advised and counseled on a whole foods based healthy diet. A summary of a healthy diet was provided in the Patient Instructions.  -reviewed patient's current physical activity level and discussed exercise guidelines for adults. Discussed community resources and ideas for safe exercise at home to assist in meeting exercise guideline recommendations in a safe and healthy way.  -Advise yearly dental visits at minimum and regular eye exams -Advised and counseled on alcohol safe limits, risks - advised to limit to no more than 2 drinks per any given day and to schedule appt if any difficulty cutting back. He reports he does not feel he would have any trouble cutting back.   Follow up: see patient instructions   Patient Instructions  I really enjoyed getting to talk with you today! I am available on Tuesdays and Thursdays for virtual visits if you have any questions or concerns, or if I can be of any further assistance.   CHECKLIST FROM ANNUAL WELLNESS VISIT:  -Follow up (please call to schedule if not scheduled after visit):   -yearly for annual wellness visit with primary care office  Here is a list of your preventive care/health maintenance measures and the plan for each if any are due:  PLAN For any measures below that may be due:  -please call to schedule you eye appointment, colonoscopy and lung cancer screening *Colonoscopy: Dr. Marina Goodell [phone] *Lung Cancer Screening: Kandice Robinsons [phone] -can get the vaccines at the pharmacy, please let us know when you do and bring copy of receit so that we can update your record -can get labs and foot exam at next in office appointment  Health Maintenance   Topic Date Due   OPHTHALMOLOGY EXAM  04/22/2012   DTaP/Tdap/Td (2 - Tdap) 07/03/2015   Lung Cancer Screening  09/02/2020   Colonoscopy  07/09/2021   COVID-19 Vaccine (4 - 2024-25 season) 03/03/2023   Diabetic kidney evaluation - Urine ACR  10/02/2023   FOOT EXAM  10/02/2023   Diabetic kidney evaluation - eGFR measurement  11/06/2023   HEMOGLOBIN A1C  11/06/2023   Medicare Annual Wellness (AWV)  09/18/2024   Pneumonia Vaccine 27+ Years old  Completed   INFLUENZA VACCINE  Completed   Hepatitis C Screening  Completed   Zoster Vaccines- Shingrix  Completed   HPV VACCINES  Aged Out    -See a dentist at least yearly  -Get your eyes checked and then per your eye specialist's recommendations  -Other issues addressed today:   Alcohol: please cut back to no more than 1-2 drinks per any given day   -I have included below further information regarding a healthy whole foods based diet, physical activity guidelines for adults, stress management and opportunities for social connections. I hope you find this information useful.   -----------------------------------------------------------------------------------------------------------------------------------------------------------------------------------------------------------------------------------------------------------  NUTRITION: -eat real food: lots of colorful vegetables (half the plate) and fruits -5-7 servings of vegetables and fruits per day (fresh or steamed is best), exp. 2 servings of vegetables with lunch and dinner and 2 servings of fruit per day. Berries and greens such as kale and collards are great choices.  -consume on a regular basis:  fresh fruits, fresh veggies, fish, nuts, seeds, healthy oils (such as olive oil, avocado oil), whole grains (make sure for bread/pasta/crackers/etc., that the first ingredient on label contains the word "whole"), legumes. -can eat small amounts of dairy and lean meat (no larger than the  palm of your hand), but avoid processed meats such as ham, bacon, lunch meat, etc. -drink water -try to avoid fast food and pre-packaged foods, processed meat, ultra processed foods/beverages (donuts, candy, etc.) -most experts advise limiting sodium to < 2300mg  per day, should limit further is any chronic conditions such as high blood pressure, heart disease, diabetes, etc. The American Heart Association advised that < 1500mg  is is ideal -try to avoid foods/beverages that contain any ingredients with names you do not recognize  -try to avoid foods/beverages  with added sugar or sweeteners/sweets  -try to avoid sweet drinks (including diet drinks): soda, juice, Gatorade, sweet tea, power drinks, diet drinks -try to avoid white rice, white bread, pasta (unless whole grain)  EXERCISE GUIDELINES FOR ADULTS: -if you wish to increase your physical activity, do so gradually and with the approval of your doctor -STOP and seek medical care immediately if you have any chest pain, chest discomfort or trouble breathing when starting or increasing exercise  -move and stretch your body, legs, feet and arms when sitting for long periods -Physical activity guidelines for optimal health in adults: -get at least 150 minutes per week of moderate exercise (can talk, but not sing); this is about 20-30 minutes of sustained activity 5-7 days per week or two 10-15 minute episodes of sustained activity 5-7 days per week -do some muscle building/resistance training/strength training at least 2 days per week  -balance exercises 3+ days per week:   Stand somewhere where you have something sturdy to hold onto if you lose balance    1) lift up on toes, then back down, start with 5x per day and work up to 20x   2) stand and lift one leg straight out to the side so that foot is a few inches of the floor, start with 5x each side and work up to 20x each side   3) stand on one foot, start with 5 seconds each side and work up to  20 seconds on each side  If you need ideas or help with getting more active:  -Silver sneakers https://tools.silversneakers.com  -Walk with a Doc: http://www.duncan-williams.com/  -try to include resistance (weight lifting/strength building) and balance exercises twice per week: or the following link for ideas: http://castillo-powell.com/  BuyDucts.dk  STRESS MANAGEMENT: -can try meditating, or just sitting quietly with deep breathing while intentionally relaxing all parts of your body for 5 minutes daily -if you need further help with stress, anxiety or depression please follow up with your primary doctor or contact the wonderful folks at [facility]: [phone]  SOCIAL CONNECTIONS: -options in [place] if you wish to engage in more social and exercise related activities:  -Silver sneakers https://tools.silversneakers.com  -Walk with a Doc: http://www.duncan-williams.com/  -Check out the [facility] Active Adults 50+ section on the [place] [employer] (hiking clubs, book clubs, cards and games, chess, exercise classes,  aquatic classes and much more) - see the website for details: https://www.[place].gov/departments/parks-recreation/active-adults50  -YouTube has lots of exercise videos for different ages and abilities as well  -[facility] (a variety of indoor and outdoor inperson activities for adults). [phone]. [address]  -Virtual Online Classes (a variety of topics): see seniorplanet.org or call [phone]  -consider volunteering at a school, hospice center, church, senior center or elsewhere    ADVANCED HEALTHCARE DIRECTIVES:  [place] Advanced Directives assistance:   ExpressWeek.com.cy  Everyone should have advanced health care directives in place. This is so that you get the  care you want, should you ever be in a situation where you are unable to make your own medical decisions.   From the [place] Advanced Directive Website: "Advance Health Care Directives are legal documents in which you give written instructions about your health care if, in the future, you cannot speak for yourself.   A health care power of attorney allows you to name a person you trust to make your health care decisions if you cannot make them yourself. A declaration of a desire for a natural death (or living will) is document, which states that you desire not to have your life prolonged by extraordinary measures if you have a terminal or incurable illness or if you are in a vegetative state. An advance instruction for mental health treatment makes a declaration of instructions, information and preferences regarding your mental health treatment. It also states that you are aware that the advance instruction authorizes a mental health treatment provider to act according to your wishes. It may also outline your consent or refusal of mental health treatment. A declaration of an anatomical gift allows anyone over the age of 38 to make a gift by will, organ donor card or other document."   Please see the following website or an elder law attorney for forms, FAQs and for completion of advanced directives: [place] TEFL teacher Health Care Directives Advance Health Care Directives (http://guzman.com/)  Or copy and paste the following to your web browser: PoshChat.fi          Terressa Koyanagi, DO

## 2023-09-19 NOTE — Progress Notes (Signed)
 Patient unable to obtain vital signs due to telehealth visit

## 2023-09-19 NOTE — Patient Instructions (Addendum)
 I really enjoyed getting to talk with you today! I am available on Tuesdays and Thursdays for virtual visits if you have any questions or concerns, or if I can be of any further assistance.   CHECKLIST FROM ANNUAL WELLNESS VISIT:  -Follow up (please call to schedule if not scheduled after visit):   -yearly for annual wellness visit with primary care office  Here is a list of your preventive care/health maintenance measures and the plan for each if any are due:  PLAN For any measures below that may be due:  -please call to schedule you eye appointment, colonoscopy and lung cancer screening *Colonoscopy: Dr. Marina Goodell [phone] *Lung Cancer Screening: Kandice Robinsons [phone] -can get the vaccines at the pharmacy, please let us know when you do and bring copy of receit so that we can update your record -can get labs and foot exam at next in office appointment  Health Maintenance  Topic Date Due   OPHTHALMOLOGY EXAM  04/22/2012   DTaP/Tdap/Td (2 - Tdap) 07/03/2015   Lung Cancer Screening  09/02/2020   Colonoscopy  07/09/2021   COVID-19 Vaccine (4 - 2024-25 season) 03/03/2023   Diabetic kidney evaluation - Urine ACR  10/02/2023   FOOT EXAM  10/02/2023   Diabetic kidney evaluation - eGFR measurement  11/06/2023   HEMOGLOBIN A1C  11/06/2023   Medicare Annual Wellness (AWV)  09/18/2024   Pneumonia Vaccine 48+ Years old  Completed   INFLUENZA VACCINE  Completed   Hepatitis C Screening  Completed   Zoster Vaccines- Shingrix  Completed   HPV VACCINES  Aged Out    -See a dentist at least yearly  -Get your eyes checked and then per your eye specialist's recommendations  -Other issues addressed today:   Alcohol: please cut back to no more than 1-2 drinks per any given day   -I have included below further information regarding a healthy whole foods based diet, physical activity guidelines for adults, stress management and opportunities for social connections. I hope you find this  information useful.   -----------------------------------------------------------------------------------------------------------------------------------------------------------------------------------------------------------------------------------------------------------    NUTRITION: -eat real food: lots of colorful vegetables (half the plate) and fruits -5-7 servings of vegetables and fruits per day (fresh or steamed is best), exp. 2 servings of vegetables with lunch and dinner and 2 servings of fruit per day. Berries and greens such as kale and collards are great choices.  -consume on a regular basis:  fresh fruits, fresh veggies, fish, nuts, seeds, healthy oils (such as olive oil, avocado oil), whole grains (make sure for bread/pasta/crackers/etc., that the first ingredient on label contains the word "whole"), legumes. -can eat small amounts of dairy and lean meat (no larger than the palm of your hand), but avoid processed meats such as ham, bacon, lunch meat, etc. -drink water -try to avoid fast food and pre-packaged foods, processed meat, ultra processed foods/beverages (donuts, candy, etc.) -most experts advise limiting sodium to < 2300mg  per day, should limit further is any chronic conditions such as high blood pressure, heart disease, diabetes, etc. The American Heart Association advised that < 1500mg  is is ideal -try to avoid foods/beverages that contain any ingredients with names you do not recognize  -try to avoid foods/beverages  with added sugar or sweeteners/sweets  -try to avoid sweet drinks (including diet drinks): soda, juice, Gatorade, sweet tea, power drinks, diet drinks -try to avoid white rice, white bread, pasta (unless whole grain)  EXERCISE GUIDELINES FOR ADULTS: -if you wish to increase your physical activity, do so gradually  and with the approval of your doctor -STOP and seek medical care immediately if you have any chest pain, chest discomfort or trouble breathing  when starting or increasing exercise  -move and stretch your body, legs, feet and arms when sitting for long periods -Physical activity guidelines for optimal health in adults: -get at least 150 minutes per week of moderate exercise (can talk, but not sing); this is about 20-30 minutes of sustained activity 5-7 days per week or two 10-15 minute episodes of sustained activity 5-7 days per week -do some muscle building/resistance training/strength training at least 2 days per week  -balance exercises 3+ days per week:   Stand somewhere where you have something sturdy to hold onto if you lose balance    1) lift up on toes, then back down, start with 5x per day and work up to 20x   2) stand and lift one leg straight out to the side so that foot is a few inches of the floor, start with 5x each side and work up to 20x each side   3) stand on one foot, start with 5 seconds each side and work up to 20 seconds on each side  If you need ideas or help with getting more active:  -Silver sneakers https://tools.silversneakers.com  -Walk with a Doc: http://www.duncan-williams.com/  -try to include resistance (weight lifting/strength building) and balance exercises twice per week: or the following link for ideas: http://castillo-powell.com/  BuyDucts.dk  STRESS MANAGEMENT: -can try meditating, or just sitting quietly with deep breathing while intentionally relaxing all parts of your body for 5 minutes daily -if you need further help with stress, anxiety or depression please follow up with your primary doctor or contact the wonderful folks at [facility]: [phone]  SOCIAL CONNECTIONS: -options in [place] if you wish to engage in more social and exercise related activities:  -Silver sneakers https://tools.silversneakers.com  -Walk with a Doc: http://www.duncan-williams.com/  -Check out the [facility]  Active Adults 50+ section on the [place] of [employer] (hiking clubs, book clubs, cards and games, chess, exercise classes, aquatic classes and much more) - see the website for details: https://www.[place].gov/departments/parks-recreation/active-adults50  -YouTube has lots of exercise videos for different ages and abilities as well  -[facility] (a variety of indoor and outdoor inperson activities for adults). [phone]. [address].  -Virtual Online Classes (a variety of topics): see seniorplanet.org or call [phone]  -consider volunteering at a school, hospice center, church, senior center or elsewhere    ADVANCED HEALTHCARE DIRECTIVES:  [place] Advanced Directives assistance:   ExpressWeek.com.cy  Everyone should have advanced health care directives in place. This is so that you get the care you want, should you ever be in a situation where you are unable to make your own medical decisions.   From the [place] Advanced Directive Website: "Advance Health Care Directives are legal documents in which you give written instructions about your health care if, in the future, you cannot speak for yourself.   A health care power of attorney allows you to name a person you trust to make your health care decisions if you cannot make them yourself. A declaration of a desire for a natural death (or living will) is document, which states that you desire not to have your life prolonged by extraordinary measures if you have a terminal or incurable illness or if you are in a vegetative state. An advance instruction for mental health treatment makes a declaration of instructions, information and preferences regarding your  mental health treatment. It also states that you are aware that the advance instruction authorizes a mental health treatment provider to act according to your wishes. It may also outline your consent  or refusal of mental health treatment. A declaration of an anatomical gift allows anyone over the age of 5 to make a gift by will, organ donor card or other document."   Please see the following website or an elder law attorney for forms, FAQs and for completion of advanced directives: [place] Health Care Directives Advance Health Care Directives (http://guzman.[email or web address]/)  Or copy and paste the following to your web browser: [email or web address].[email or web address]

## 2023-10-03 ENCOUNTER — Other Ambulatory Visit: Payer: Self-pay | Admitting: *Deleted

## 2023-10-03 ENCOUNTER — Encounter: Payer: Self-pay | Admitting: Acute Care

## 2023-10-03 ENCOUNTER — Ambulatory Visit (INDEPENDENT_AMBULATORY_CARE_PROVIDER_SITE_OTHER): Payer: Medicare (Managed Care) | Admitting: Adult Health

## 2023-10-03 VITALS — BP 138/80 | HR 55 | Temp 97.8°F | Ht 67.0 in | Wt 187.0 lb

## 2023-10-03 DIAGNOSIS — Z1211 Encounter for screening for malignant neoplasm of colon: Secondary | ICD-10-CM

## 2023-10-03 DIAGNOSIS — K2100 Gastro-esophageal reflux disease with esophagitis, without bleeding: Secondary | ICD-10-CM

## 2023-10-03 DIAGNOSIS — G250 Essential tremor: Secondary | ICD-10-CM | POA: Diagnosis not present

## 2023-10-03 DIAGNOSIS — I10 Essential (primary) hypertension: Secondary | ICD-10-CM | POA: Diagnosis not present

## 2023-10-03 DIAGNOSIS — Z125 Encounter for screening for malignant neoplasm of prostate: Secondary | ICD-10-CM | POA: Diagnosis not present

## 2023-10-03 DIAGNOSIS — E785 Hyperlipidemia, unspecified: Secondary | ICD-10-CM

## 2023-10-03 DIAGNOSIS — Z720 Tobacco use: Secondary | ICD-10-CM | POA: Diagnosis not present

## 2023-10-03 DIAGNOSIS — E1129 Type 2 diabetes mellitus with other diabetic kidney complication: Secondary | ICD-10-CM | POA: Diagnosis not present

## 2023-10-03 DIAGNOSIS — Z0000 Encounter for general adult medical examination without abnormal findings: Secondary | ICD-10-CM

## 2023-10-03 DIAGNOSIS — Z7984 Long term (current) use of oral hypoglycemic drugs: Secondary | ICD-10-CM

## 2023-10-03 LAB — CBC
HCT: 44.3 % (ref 39.0–52.0)
Hemoglobin: 15.3 g/dL (ref 13.0–17.0)
MCHC: 34.6 g/dL (ref 30.0–36.0)
MCV: 106.3 fl — ABNORMAL HIGH (ref 78.0–100.0)
Platelets: 198 10*3/uL (ref 150.0–400.0)
RBC: 4.16 Mil/uL — ABNORMAL LOW (ref 4.22–5.81)
RDW: 12.6 % (ref 11.5–15.5)
WBC: 7.1 10*3/uL (ref 4.0–10.5)

## 2023-10-03 LAB — MICROALBUMIN / CREATININE URINE RATIO
Creatinine,U: 141.9 mg/dL
Microalb Creat Ratio: 92.2 mg/g — ABNORMAL HIGH (ref 0.0–30.0)
Microalb, Ur: 13.1 mg/dL — ABNORMAL HIGH (ref 0.0–1.9)

## 2023-10-03 LAB — LIPID PANEL
Cholesterol: 102 mg/dL (ref 0–200)
HDL: 45.1 mg/dL (ref >39.00)
LDL Cholesterol: 42 mg/dL (ref 0–99)
NonHDL: 57.07
Total CHOL/HDL Ratio: 2
Triglycerides: 77 mg/dL (ref 0.0–149.0)
VLDL: 15.4 mg/dL (ref 0.0–40.0)

## 2023-10-03 LAB — COMPREHENSIVE METABOLIC PANEL WITH GFR
ALT: 14 U/L (ref 0–53)
AST: 16 U/L (ref 0–37)
Albumin: 4.2 g/dL (ref 3.5–5.2)
Alkaline Phosphatase: 83 U/L (ref 39–117)
BUN: 12 mg/dL (ref 6–23)
CO2: 27 meq/L (ref 19–32)
Calcium: 9.4 mg/dL (ref 8.4–10.5)
Chloride: 101 meq/L (ref 96–112)
Creatinine, Ser: 1.61 mg/dL — ABNORMAL HIGH (ref 0.40–1.50)
GFR: 43.12 mL/min — ABNORMAL LOW (ref >60.00)
Glucose, Bld: 117 mg/dL — ABNORMAL HIGH (ref 70–99)
Potassium: 4.2 meq/L (ref 3.5–5.1)
Sodium: 133 meq/L — ABNORMAL LOW (ref 135–145)
Total Bilirubin: 0.9 mg/dL (ref 0.2–1.2)
Total Protein: 6.8 g/dL (ref 6.0–8.3)

## 2023-10-03 LAB — HEMOGLOBIN A1C: Hgb A1c MFr Bld: 6.1 % (ref 4.6–6.5)

## 2023-10-03 LAB — TSH: TSH: 0.96 u[IU]/mL (ref 0.35–5.50)

## 2023-10-03 LAB — PSA: PSA: 1.2 ng/mL (ref 0.10–4.00)

## 2023-10-03 MED ORDER — AMLODIPINE BESYLATE 5 MG PO TABS: ORAL_TABLET | ORAL | 3 refills | Status: DC

## 2023-10-03 MED ORDER — METFORMIN HCL ER 500 MG PO TB24: 500.0000 mg | ORAL_TABLET | Freq: Every day | ORAL | 1 refills | Status: DC

## 2023-10-03 MED ORDER — PROPRANOLOL HCL ER 120 MG PO CP24: 120.0000 mg | ORAL_CAPSULE | Freq: Two times a day (BID) | ORAL | 3 refills | Status: AC

## 2023-10-03 MED ORDER — LISINOPRIL 40 MG PO TABS: 40.0000 mg | ORAL_TABLET | Freq: Every day | ORAL | 3 refills | Status: DC

## 2023-10-03 MED ORDER — ATORVASTATIN CALCIUM 40 MG PO TABS
ORAL_TABLET | ORAL | 3 refills | Status: AC
Start: 2023-10-03 — End: ?

## 2023-10-03 NOTE — Progress Notes (Signed)
 Subjective:    Patient ID: Richard [name], male    DOB: 12/08/1950, 73 y.o.   MRN: [id number]  HPI Patient presents for yearly preventative medicine examination. He is a pleasant 73 year old male who  has a past medical history of ANEMIA-NOS (12/06/2008), BACK PAIN (02/20/2010), DIABETES MELLITUS, TYPE II (06/01/2008), GERD (06/01/2008), HYPERLIPIDEMIA (06/01/2008), HYPERTENSION (06/01/2008), and Tremors of nervous system.  Diabetes mellitus type 2- He has been diet controlled. We have tried to get him approved for [place] Pepper and Marcelline Deist in the past for kidney protection but this was not covered by his insurance.  He has been checking his blood sugar at home with readings in the 120 range..  Lab Results  Component Value Date   HGBA1C 5.7 (A) 05/09/2023   HGBA1C 5.9 10/02/2022   HGBA1C 5.6 01/04/2022    Hypertension -well-controlled with lisinopril 40 mg daily and Norvasc 5 mg daily.  He denies episodes of dizziness, lightheadedness, chest pain, or shortness of breath BP Readings from Last 3 Encounters:  05/09/23 120/82  10/02/22 110/78  01/04/22 124/80   Hyperlipidemia-currently managed with Lipitor 40 mg daily.  He denies myalgia or fatigue Lab Results  Component Value Date   CHOL 116 10/02/2022   HDL 44.10 10/02/2022   LDLCALC 55 10/02/2022   TRIG 83.0 10/02/2022   CHOLHDL 3 10/02/2022   Tobacco Use -He continues to smoke.   In the past he has tried Wellbutrin and Chantix.  He has quit multiple times in the past but eventually goes back to smoking.  GERD- controlled  omeprazole 20 mg PRN.   Essential tremor-takes propranolol 120 mg daily. He continues to have tremors and would to increase his dose to see if he can get the tremors to be less.   All immunizations and health maintenance protocols were reviewed with the patient and needed orders were placed.  Appropriate screening laboratory values were ordered for the patient including screening of hyperlipidemia, renal function and  hepatic function. If indicated by BPH, a PSA was ordered.  Medication reconciliation,  past medical history, social history, problem list and allergies were reviewed in detail with the patient  Goals were established with regard to weight loss, exercise, and  diet in compliance with medications  He is overdue for screening colonoscopy, his recall letter was sent in 2023. 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     Review of Systems  Constitutional: Negative.   HENT: Negative.    Eyes: Negative.   Respiratory: Negative.    Cardiovascular: Negative.   Gastrointestinal: Negative.   Endocrine: Negative.   Genitourinary: Negative.   Musculoskeletal: Negative.   Skin: Negative.   Allergic/Immunologic: Negative.   Neurological: Negative.   Hematological: Negative.   Psychiatric/Behavioral: Negative.    All other systems reviewed and are negative.  Past Medical History:  Diagnosis Date   ANEMIA-NOS 12/06/2008   BACK PAIN 02/20/2010   DIABETES MELLITUS, TYPE II 06/01/2008   GERD 06/01/2008   HYPERLIPIDEMIA 06/01/2008   HYPERTENSION 06/01/2008   Tremors of nervous system    pt states he has had tremors most of his life in his hands/hereditary    Social History   Socioeconomic History   Marital status: Married    Spouse name: Not on file   Number of children: Not on file   Years of education: Not on file   Highest education level: Not on file  Occupational History  Not on file  Tobacco Use   Smoking status: Every Day    Current packs/day: 2.00    Average packs/day: 2.0 packs/day for 35.0 years (70.0 ttl pk-yrs)    Types: Cigarettes   Smokeless tobacco: Never   Tobacco comments:    down to 1.5 ppd X8 mos.  plans to quit on upcoming birthday  Vaping Use   Vaping status: Never Used  Substance and Sexual Activity   Alcohol use: Yes    Alcohol/week: 2.0 - 3.0 standard drinks of alcohol    Types: 2 - 3 Standard drinks or equivalent  per week   Drug use: No   Sexual activity: Not on file  Other Topics Concern   Not on file  Social History Narrative   Not on file   Social Drivers of Health   Financial Resource Strain: Low Risk  (09/19/2023)   Overall Financial Resource Strain (CARDIA)    Difficulty of Paying Living Expenses: Not very hard  Food Insecurity: No Food Insecurity (09/19/2023)   Hunger Vital Sign    Worried About Running Out of Food in the Last Year: Never true    Ran Out of Food in the Last Year: Never true  Transportation Needs: No Transportation Needs (09/19/2023)   PRAPARE - Administrator, Civil Service (Medical): No    Lack of Transportation (Non-Medical): No  Physical Activity: Inactive (09/19/2023)   Exercise Vital Sign    Days of Exercise per Week: 0 days    Minutes of Exercise per Session: 0 min  Stress: No Stress Concern Present (09/19/2023)   [employer] of Occupational Health - Occupational Stress Questionnaire    Feeling of Stress : Not at all  Social Connections: Moderately Isolated (09/19/2023)   Social Connection and Isolation Panel [NHANES]    Frequency of Communication with Friends and Family: Three times a week    Frequency of Social Gatherings with Friends and Family: Once a week    Attends Religious Services: Never    Database administrator or Organizations: No    Attends Banker Meetings: Never    Marital Status: Married  Catering manager Violence: Not At Risk (09/19/2023)   Humiliation, Afraid, Rape, and Kick questionnaire    Fear of Current or Ex-Partner: No    Emotionally Abused: No    Physically Abused: No    Sexually Abused: No    Past Surgical History:  Procedure Laterality Date   COLONOSCOPY     ELBOW BURSA SURGERY     right elbow   ELBOW BURSA SURGERY Left 11/12/2018   Pt had left olecranon bursectomy   HAND SURGERY     2 surgeries on right hand   HAND SURGERY     left hand/removed a bone    Family History  Problem Relation  Age of Onset   Diabetes Sister    Alzheimer's disease Sister    Diabetes Brother    Heart disease Mother    Sudden death Father        Suicide after his wife passed away    Colon cancer Neg Hx     No Known Allergies  Current Outpatient Medications on File Prior to Visit  Medication Sig Dispense Refill   Ascorbic Acid (VITAMIN C) 1000 MG tablet Take 1,000 mg by mouth daily.     Blood Glucose Monitoring Suppl DEVI Use to check blood glucose TID 1 each 0   Blood Glucose Monitoring Suppl DEVI 1 each by Does  not apply route in the morning, at noon, and at bedtime. May substitute to any manufacturer covered by patient's insurance. 1 each 0   cholecalciferol (VITAMIN D3) 25 MCG (1000 UNIT) tablet Take 1,000 Units by mouth daily.     Lancets (FREESTYLE) lancets Use as instructed 100 each 12   nicotine (NICODERM CQ - DOSED IN MG/24 HOURS) 21 mg/24hr patch Place 21 mg onto the skin daily.     omeprazole (PRILOSEC) 20 MG capsule TAKE 1 CAPSULE(20 MG) BY MOUTH DAILY (Patient taking differently: TAKE 1 CAPSULE(20 MG) BY MOUTH every other day) 90 capsule 3   No current facility-administered medications on file prior to visit.    BP 138/80   Pulse (!) 55   Temp 97.8 F (36.6 C) (Oral)   Ht 5\' 7"  (1.702 m)   Wt 187 lb (84.8 kg)   SpO2 97%   BMI 29.29 kg/m       Objective:   Physical Exam Vitals and nursing note reviewed.  Constitutional:      General: He is not in acute distress.    Appearance: Normal appearance. He is not ill-appearing.  HENT:     Head: Normocephalic and atraumatic.     Right Ear: Tympanic membrane, ear canal and external ear normal. There is no impacted cerumen.     Left Ear: Tympanic membrane, ear canal and external ear normal. There is no impacted cerumen.     Nose: Nose normal. No congestion or rhinorrhea.     Mouth/Throat:     Mouth: Mucous membranes are moist.     Pharynx: Oropharynx is clear.  Eyes:     Extraocular Movements: Extraocular movements intact.      Conjunctiva/sclera: Conjunctivae normal.     Pupils: Pupils are equal, round, and reactive to light.  Neck:     Vascular: No carotid bruit.  Cardiovascular:     Rate and Rhythm: Normal rate and regular rhythm.     Pulses: Normal pulses.     Heart sounds: Normal heart sounds. No murmur heard.    No friction rub. No gallop.  Pulmonary:     Effort: Pulmonary effort is normal.     Breath sounds: Normal breath sounds.  Abdominal:     General: Abdomen is flat. Bowel sounds are normal. There is no distension.     Palpations: Abdomen is soft. There is no mass.     Tenderness: There is no abdominal tenderness. There is no guarding or rebound.     Hernia: No hernia is present.  Musculoskeletal:        General: Normal range of motion.     Cervical back: Normal range of motion and neck supple.  Lymphadenopathy:     Cervical: No cervical adenopathy.  Skin:    General: Skin is warm and dry.     Capillary Refill: Capillary refill takes less than 2 seconds.  Neurological:     General: No focal deficit present.     Mental Status: He is alert and oriented to person, place, and time.  Psychiatric:        Mood and Affect: Mood normal.        Behavior: Behavior normal.        Thought Content: Thought content normal.        Judgment: Judgment normal.       Assessment & Plan:  1. Routine general medical examination at a health care facility (Primary) Today patient counseled on age appropriate routine health concerns for screening  and prevention, each reviewed and up to date or declined. Immunizations reviewed and up to date or declined. Labs ordered and reviewed. Risk factors for depression reviewed and negative. Hearing function and visual acuity are intact. ADLs screened and addressed as needed. Functional ability and level of safety reviewed and appropriate. Education, counseling and referrals performed based on assessed risks today. Patient provided with a copy of personalized plan for  preventive services. - Follow up in one year or sooner if needed   2. Type 2 diabetes mellitus with other diabetic kidney complication, without long-term current use of insulin (HCC) - Consider adding Metformin  - likely six month follow up.  - Lipid panel; Future - TSH; Future - CBC; Future - Comprehensive metabolic panel with GFR; Future - Hemoglobin A1c; Future - Microalbumin/Creatinine Ratio, Urine; Future - Microalbumin/Creatinine Ratio, Urine - Hemoglobin A1c - Comprehensive metabolic panel with GFR - CBC - TSH - Lipid panel  3. Essential hypertension - Well controlled. No change in medication  - Lipid panel; Future - TSH; Future - CBC; Future - Comprehensive metabolic panel with GFR; Future - amLODipine (NORVASC) 5 MG tablet; Take daily  Dispense: 90 tablet; Refill: 3 - lisinopril (ZESTRIL) 40 MG tablet; Take 1 tablet (40 mg total) by mouth daily. TAKE 1 TABLET(40 MG) BY MOUTH DAILY  Dispense: 90 tablet; Refill: 3 - Comprehensive metabolic panel with GFR - CBC - TSH - Lipid panel  4. Dyslipidemia - Continue with lipitor  - Lipid panel; Future - TSH; Future - CBC; Future - Comprehensive metabolic panel with GFR; Future - atorvastatin (LIPITOR) 40 MG tablet; TAKE 1 TABLET(40 MG) BY MOUTH DAILY  Dispense: 90 tablet; Refill: 3 - Comprehensive metabolic panel with GFR - CBC - TSH - Lipid panel  5. Tobacco use - Encouraged to quit  - Lipid panel; Future - TSH; Future - CBC; Future - Comprehensive metabolic panel with GFR; Future - Comprehensive metabolic panel with GFR - CBC - TSH - Lipid panel  6. Gastroesophageal reflux disease with esophagitis without hemorrhage - Continue PPI as needed - Lipid panel; Future - TSH; Future - CBC; Future - Comprehensive metabolic panel with GFR; Future - Comprehensive metabolic panel with GFR - CBC - TSH - Lipid panel  7. Essential tremor - Will try increasing Propanol dose to 120 mg BID.  - Lipid panel; Future -  TSH; Future - CBC; Future - Comprehensive metabolic panel with GFR; Future - Hemoglobin A1c; Future - Microalbumin/Creatinine Ratio, Urine; Future - propranolol ER (INDERAL LA) 120 MG 24 hr capsule; Take 1 capsule (120 mg total) by mouth 2 (two) times daily.  Dispense: 180 capsule; Refill: 3 - Microalbumin/Creatinine Ratio, Urine - Hemoglobin A1c - Comprehensive metabolic panel with GFR - CBC - TSH - Lipid panel  8. Prostate cancer screening  - PSA; Future - PSA  9. Colon cancer screening - Refused at this time. He will let me know when he time to schedule   Shirline Frees, NP

## 2023-11-07 ENCOUNTER — Ambulatory Visit: Admitting: Adult Health

## 2023-11-07 VITALS — BP 130/68 | HR 66 | Temp 98.1°F | Ht 67.0 in | Wt 187.0 lb

## 2023-11-07 DIAGNOSIS — T162XXA Foreign body in left ear, initial encounter: Secondary | ICD-10-CM | POA: Diagnosis not present

## 2023-11-07 DIAGNOSIS — H6123 Impacted cerumen, bilateral: Secondary | ICD-10-CM | POA: Diagnosis not present

## 2023-11-07 DIAGNOSIS — H6993 Unspecified Eustachian tube disorder, bilateral: Secondary | ICD-10-CM

## 2023-11-07 NOTE — Progress Notes (Signed)
 Subjective:    Patient ID: Richard [name], male    DOB: [birth year], 73 y.o.   MRN: [id number]  Ear Fullness    73 year old male who  has a past medical history of ANEMIA-NOS (12/06/2008), BACK PAIN (02/20/2010), DIABETES MELLITUS, TYPE II (06/01/2008), GERD (06/01/2008), HYPERLIPIDEMIA (06/01/2008), HYPERTENSION (06/01/2008), and Tremors of nervous system.  He presents to the office today for an acute issue. He reports a feeling of ear fullness for the last few days. He has not had any pain or drainage.    Review of Systems See HPI   Past Medical History:  Diagnosis Date   ANEMIA-NOS 12/06/2008   BACK PAIN 02/20/2010   DIABETES MELLITUS, TYPE II 06/01/2008   GERD 06/01/2008   HYPERLIPIDEMIA 06/01/2008   HYPERTENSION 06/01/2008   Tremors of nervous system    pt states he has had tremors most of his life in his hands/hereditary    Social History   Socioeconomic History   Marital status: Married    Spouse name: Not on file   Number of children: Not on file   Years of education: Not on file   Highest education level: Not on file  Occupational History   Not on file  Tobacco Use   Smoking status: Every Day    Current packs/day: 2.00    Average packs/day: 2.0 packs/day for 35.0 years (70.0 ttl pk-yrs)    Types: Cigarettes   Smokeless tobacco: Never   Tobacco comments:    down to 1.5 ppd X8 mos.  plans to quit on upcoming birthday  Vaping Use   Vaping status: Never Used  Substance and Sexual Activity   Alcohol use: Yes    Alcohol/week: 2.0 - 3.0 standard drinks of alcohol    Types: 2 - 3 Standard drinks or equivalent per week   Drug use: No   Sexual activity: Not on file  Other Topics Concern   Not on file  Social History Narrative   Not on file   Social Drivers of Health   Financial Resource Strain: Low Risk  (09/19/2023)   Overall Financial Resource Strain (CARDIA)    Difficulty of Paying Living Expenses: Not very hard  Food Insecurity: No Food Insecurity (09/19/2023)    Hunger Vital Sign    Worried About Running Out of Food in the Last Year: Never true    Ran Out of Food in the Last Year: Never true  Transportation Needs: No Transportation Needs (09/19/2023)   PRAPARE - Administrator, Civil Service (Medical): No    Lack of Transportation (Non-Medical): No  Physical Activity: Inactive (09/19/2023)   Exercise Vital Sign    Days of Exercise per Week: 0 days    Minutes of Exercise per Session: 0 min  Stress: No Stress Concern Present (09/19/2023)   [employer] of Occupational Health - Occupational Stress Questionnaire    Feeling of Stress : Not at all  Social Connections: Moderately Isolated (09/19/2023)   Social Connection and Isolation Panel [NHANES]    Frequency of Communication with Friends and Family: Three times a week    Frequency of Social Gatherings with Friends and Family: Once a week    Attends Religious Services: Never    Database administrator or Organizations: No    Attends Banker Meetings: Never    Marital Status: Married  Catering manager Violence: Not At Risk (09/19/2023)   Humiliation, Afraid, Rape, and Kick questionnaire    Fear of Current or  Ex-Partner: No    Emotionally Abused: No    Physically Abused: No    Sexually Abused: No    Past Surgical History:  Procedure Laterality Date   COLONOSCOPY     ELBOW BURSA SURGERY     right elbow   ELBOW BURSA SURGERY Left 11/12/2018   Pt had left olecranon bursectomy   HAND SURGERY     2 surgeries on right hand   HAND SURGERY     left hand/removed a bone    Family History  Problem Relation Age of Onset   Diabetes Sister    Alzheimer's disease Sister    Diabetes Brother    Heart disease Mother    Sudden death Father        Suicide after his wife passed away    Colon cancer Neg Hx     No Known Allergies  Current Outpatient Medications on File Prior to Visit  Medication Sig Dispense Refill   amLODipine  (NORVASC ) 5 MG tablet Take daily 90 tablet 3    Ascorbic Acid (VITAMIN C) 1000 MG tablet Take 1,000 mg by mouth daily.     atorvastatin  (LIPITOR) 40 MG tablet TAKE 1 TABLET(40 MG) BY MOUTH DAILY 90 tablet 3   Blood Glucose Monitoring Suppl DEVI Use to check blood glucose TID 1 each 0   Blood Glucose Monitoring Suppl DEVI 1 each by Does not apply route in the morning, at noon, and at bedtime. May substitute to any manufacturer covered by patient's insurance. 1 each 0   cholecalciferol (VITAMIN D3) 25 MCG (1000 UNIT) tablet Take 1,000 Units by mouth daily.     Lancets (FREESTYLE) lancets Use as instructed 100 each 12   lisinopril  (ZESTRIL ) 40 MG tablet Take 1 tablet (40 mg total) by mouth daily. TAKE 1 TABLET(40 MG) BY MOUTH DAILY 90 tablet 3   metFORMIN  (GLUCOPHAGE -XR) 500 MG 24 hr tablet Take 1 tablet (500 mg total) by mouth daily with breakfast. 90 tablet 1   nicotine (NICODERM CQ - DOSED IN MG/24 HOURS) 21 mg/24hr patch Place 21 mg onto the skin daily.     omeprazole  (PRILOSEC) 20 MG capsule TAKE 1 CAPSULE(20 MG) BY MOUTH DAILY (Patient taking differently: TAKE 1 CAPSULE(20 MG) BY MOUTH every other day) 90 capsule 3   propranolol  ER (INDERAL  LA) 120 MG 24 hr capsule Take 1 capsule (120 mg total) by mouth 2 (two) times daily. 180 capsule 3   No current facility-administered medications on file prior to visit.    BP 130/68   Pulse 66   Temp 98.1 F (36.7 C) (Oral)   Ht 5\' 7"  (1.702 m)   Wt 187 lb (84.8 kg)   SpO2 95%   BMI 29.29 kg/m       Objective:   Physical Exam Vitals reviewed.  Constitutional:      Appearance: Normal appearance.  HENT:     Right Ear: Tympanic membrane, ear canal and external ear normal. There is impacted cerumen.     Left Ear: Tympanic membrane, ear canal and external ear normal. There is impacted cerumen.  Skin:    General: Skin is warm and dry.  Neurological:     General: No focal deficit present.     Mental Status: He is alert and oriented to person, place, and time.  Psychiatric:        Mood  and Affect: Mood normal.        Behavior: Behavior normal.        Thought Content:  Thought content normal.        Judgment: Judgment normal.        Assessment & Plan:  1. Bilateral impacted cerumen (Primary) Verbal Consent obtained. Warm water was applied and gentle ear lavage performed to bilateral ears.. There were no complications and following the disimpaction the tympanic membrane was visible in the right ear. The TM was not visible in the left ear and he was noted to have a foreign body present. Using alligator forceps a plastic tip of a hearing aid was removed without difficulty.  Tympanic membranes are intact following the procedure.  Auditory canals are normal.  The patient reported relief of symptoms after removal of cerumen and foreign body but still felt like his hearing was affected. Patient tolerated procedure well.    2. Foreign body of left ear, initial encounter - Removed plastic tip of hearing aid  - No signs of infection.   3. Dysfunction of both eustachian tubes - Advised flonase   Alto Atta, NP

## 2023-11-14 ENCOUNTER — Ambulatory Visit: Payer: Self-pay

## 2023-11-14 DIAGNOSIS — H833X9 Noise effects on inner ear, unspecified ear: Secondary | ICD-10-CM

## 2023-11-14 NOTE — Telephone Encounter (Signed)
  Chief Complaint: ear congestion Symptoms: bilateral Frequency: x 1 month Pertinent Negatives: Patient denies fever, injuries Disposition: [] ED /[] Urgent Care (no appt availability in office) / [] Appointment(In office/virtual)/ []  [facility]/ [] Home Care/ [] Refused Recommended Disposition /[] [facility]/ [x]  Follow-up with PCP Additional Notes: Pt c/o bilateral ear congestion x 1 month. Pt had recent acute visit with PCP and had ears cleaned out. Pt continued OTC medications at home for relief, but is not working. Pt requesting ENT referral. [facility] will forward encounter for C. Nafziger 's office to review and advise. Patient verbalized understanding and is expecting call back from office for next steps/if referral is sent.     Copied from CRM [id number]. Topic: Clinical - Medication Question >> Nov 14, 2023  1:48 PM Shereese L wrote: Reason for CRM: patient stated that he came in to get ears cleaned on last visit but they are still stopped up and head feels numb around the ears wants a referral to an ENT doctor Reason for Disposition  Ear congestion present > 48 hours  Answer Assessment - Initial Assessment Questions 1. LOCATION: "Which ear is involved?"       Bilateral "I can hear myself rattle" and some numbness around my ears 2. SENSATION: "Describe how the ear feels." (e.g. stuffy, full, plugged)."      "Stopped up" 3. ONSET:  "When did the ear symptoms start?"       X 1 month 4. PAIN: "Do you also have an earache?" If Yes, ask: "How bad is it?" (Scale 1-10; or mild, moderate, severe)     Occasional pain in L ear Endorses washing out with peroxide and OTC ear drops - with no relief/results 5. CAUSE: "What do you think is causing the ear congestion?"     wax 6. URI: "Do you have a runny nose or cough?"      Runny nose 7. NASAL ALLERGIES: "Are there symptoms of hay fever, such as sneezing or a clear nasal discharge?"     Does endorse allergies, but is using  OTC medications with no relief Requesting ENT referral  Protocols used: Ear - Congestion-A-AH

## 2023-11-15 NOTE — Telephone Encounter (Signed)
**Note De-identified  [place] Obfuscation** Please advise 

## 2023-11-20 ENCOUNTER — Other Ambulatory Visit: Payer: Self-pay | Admitting: Adult Health

## 2023-11-20 DIAGNOSIS — I10 Essential (primary) hypertension: Secondary | ICD-10-CM

## 2023-11-20 NOTE — Addendum Note (Signed)
 Addended by: Twyla Galeazzi R on: 11/20/2023 10:46 AM   Modules accepted: Orders

## 2023-11-20 NOTE — Telephone Encounter (Signed)
This has been taking care of.

## 2023-11-22 ENCOUNTER — Other Ambulatory Visit: Payer: Self-pay | Admitting: Adult Health

## 2023-11-22 DIAGNOSIS — K2100 Gastro-esophageal reflux disease with esophagitis, without bleeding: Secondary | ICD-10-CM

## 2023-11-28 NOTE — Progress Notes (Deleted)
  [address] [place], [place] [address] [phone]  Audiological Evaluation    Name: Richard [name]     DOB:   [birth year]      MRN:   [id number]                                                                                     Service Date: 11/28/2023     Accompanied by: ***   Patient comes today after Lorane Rocker, PA-C sent a referral for a hearing evaluation due to concerns with hearing loss.   Symptoms Yes Details  Hearing loss  []    Tinnitus  []    Ear pain/ infections/pressure  []    Balance problems  []    Noise exposure history  []  ***  Previous ear surgeries  []    Family history of hearing loss  []    Amplification  []    Other  []      Otoscopy: Right ear: {otoscopy:31227} Left ear:  {otoscopy:31227}  Tympanometry: Right ear: {tympanometry results:31367}. Left ear: {tympanometry results:31367}.    Pure tone Audiometry: Right ear- *** {hearing loss types:31372::"sensorineural hearing loss"} from *** Hz - *** Hz. Left ear-  *** {hearing loss types:31372::"sensorineural hearing loss"} from *** Hz - *** Hz.  Speech Audiometry: Right ear- Speech Reception Threshold (SRT) was obtained at *** dBHL. Left ear-Speech Reception Threshold (SRT) was obtained at *** dBHL.   Word Recognition Score Tested using NU-6 (MLV) Right ear: ***% was obtained at a presentation level of *** dBHL with contralateral masking which is deemed as  {word recognition score:31373}. Left ear: ***% was obtained at a presentation level of *** dBHL with contralateral masking which is deemed as  {word recognition score:31373}.   The hearing test results were completed {transducer options:31388} and results are deemed to be of {test reliability:31390::"good reliability"}. Test technique:  {audiometric test technique:31400::"conventional"}       Recommendations: {Audiology Recommendations:31370::"Follow up with ENT as scheduled for today."}   Beata Beason MARIE LEROUX-MARTINEZ,  AUD

## 2023-11-29 ENCOUNTER — Ambulatory Visit (INDEPENDENT_AMBULATORY_CARE_PROVIDER_SITE_OTHER): Admitting: Audiology

## 2023-11-29 ENCOUNTER — Institutional Professional Consult (permissible substitution) (INDEPENDENT_AMBULATORY_CARE_PROVIDER_SITE_OTHER): Admitting: Physician Assistant

## 2023-12-17 LAB — HM DIABETES EYE EXAM

## 2023-12-30 ENCOUNTER — Other Ambulatory Visit: Payer: Self-pay | Admitting: Adult Health

## 2023-12-30 DIAGNOSIS — I10 Essential (primary) hypertension: Secondary | ICD-10-CM

## 2024-01-02 ENCOUNTER — Ambulatory Visit (INDEPENDENT_AMBULATORY_CARE_PROVIDER_SITE_OTHER): Admitting: Audiology

## 2024-01-02 ENCOUNTER — Encounter (INDEPENDENT_AMBULATORY_CARE_PROVIDER_SITE_OTHER): Payer: Self-pay | Admitting: Physician Assistant

## 2024-01-02 ENCOUNTER — Ambulatory Visit (INDEPENDENT_AMBULATORY_CARE_PROVIDER_SITE_OTHER): Admitting: Physician Assistant

## 2024-01-02 VITALS — BP 146/80 | HR 56

## 2024-01-02 DIAGNOSIS — H906 Mixed conductive and sensorineural hearing loss, bilateral: Secondary | ICD-10-CM | POA: Diagnosis not present

## 2024-01-02 DIAGNOSIS — H90A31 Mixed conductive and sensorineural hearing loss, unilateral, right ear with restricted hearing on the contralateral side: Secondary | ICD-10-CM | POA: Diagnosis not present

## 2024-01-02 DIAGNOSIS — H6122 Impacted cerumen, left ear: Secondary | ICD-10-CM

## 2024-01-02 DIAGNOSIS — H9042 Sensorineural hearing loss, unilateral, left ear, with unrestricted hearing on the contralateral side: Secondary | ICD-10-CM

## 2024-01-02 DIAGNOSIS — H6992 Unspecified Eustachian tube disorder, left ear: Secondary | ICD-10-CM

## 2024-01-02 DIAGNOSIS — H6990 Unspecified Eustachian tube disorder, unspecified ear: Secondary | ICD-10-CM

## 2024-01-02 MED ORDER — FLUTICASONE PROPIONATE 50 MCG/ACT NA SUSP: 2.0000 | Freq: Every day | NASAL | 6 refills | Status: AC

## 2024-01-02 NOTE — Patient Instructions (Signed)
 I have ordered an imaging study for you to complete prior to your next visit. Please call [facility] Scheduling at [phone] to schedule your imaging if you have not received a call within 24 hours. If you are unable to complete your imaging study prior to your next scheduled visit please call our office to let us know.

## 2024-01-02 NOTE — Progress Notes (Signed)
  [address], [address] [place], [place] [address] [phone]  Audiological Evaluation    Name: Richard [name]     DOB:   1951/06/07      MRN:   [id number]                                                                                     Service Date: 01/02/2024     Accompanied by: unaccompanied   Patient comes today after Richard Cohen, PA-C sent a referral for a hearing evaluation due to concerns with hearing loss.   Symptoms Yes Details  Hearing loss  [x]  Both ears, maybe right side is worse  Tinnitus  []    Ear pain/ infections/pressure  [x]  Left side feels numb and it pops  Balance problems  [x]    Noise exposure history  []    Previous ear surgeries  [x]  Tubes as a child  Family history of hearing loss  []    Amplification  [x]  Wears his bother's aids  Other  []      Otoscopy: Right ear: Abnormal eardrum appearance. Left ear:  Clear external ear canal and notable landmarks visualized on the tympanic membrane.  Tympanometry: Right ear: Type B- Normal external ear canal volume with no middle ear pressure peak or tympanic membrane compliance. Left ear: Type A- Normal external ear canal volume with normal middle ear pressure and tympanic membrane compliance.    Pure tone Audiometry: Right ear- Moderate to severe essentially mixed hearing loss from 125 Hz - 8000 Hz, with air-bone gaps at 250 and 4000 Hz. Left ear-  Normal to moderately severe sensorineural hearing loss from 125 Hz - 8000 Hz.  Speech Audiometry: Right ear- Speech Reception Threshold (SRT) was obtained at 45 dBHL. Left ear-Speech Reception Threshold (SRT) was obtained at 50 dBHL.   Word Recognition Score Tested using NU-6 ordered by difficulty (recorded) Right ear: 12% was obtained at a presentation level of 90 dBHL with contralateral masking which is deemed as  poor. Left ear: 92% was obtained at a presentation level of 85 dBHL with contralateral masking which is deemed as  excellent.   The hearing  test results were completed under headphones and re-checked with inserts and results are deemed to be of good to fair reliability. Test technique:  conventional    Of note- There is a hearing symmetry - form some pitches the right is worse and for others the opposite.   Recommendations: Follow up with ENT as scheduled for today. Consider a communication needs assessment after medical clearance for hearing aids is obtained.  Repeat audiogram after medical care or as per MD.   Richard [name], AUD

## 2024-01-02 NOTE — Progress Notes (Signed)
 Dear Dr. Merna, Here is my assessment for our mutual patient, Richard [name]. Thank you for allowing me the opportunity to care for your patient. Please do not hesitate to contact me should you have any other questions. Sincerely, Chyrl Cohen PA-C  Otolaryngology Clinic Note Referring provider: Dr. Merna HPI:  Richard [name] is a 73 y.o. male kindly referred by Dr. Merna   The patient is a 73 year old gentleman seen in our office for evaluation of left-sided ear issues.  The patient notes approximately 6 months ago he started having clicking and popping in the left ear.  He feels like the ear is numb around the soft tissue surrounding it.  He denies any associated pain.  He notes that he has a longstanding history of hearing loss in both ears which feels symmetric.  He notes his hearing has gotten worse but feels this is equal.  He denies any associated ringing, he does get some minimal dizziness from time to time, noting history of vertigo previously but notes the symptoms are much milder.  He notes he can get his ears to pop when he yawns or opens his mouth wide.  He notes his symptoms feel better after his ear pop.  He notes a history of tympanostomy tubes as a child noting he had several tubes placed but they kept falling out.  He has been trying eardrops including peroxide at home to see if this would help his symptoms.  He does note a history of seasonal allergies and is not currently taking any medications.  He notes he has tried using his brothers hearing aids which seem to improve his hearing.    Independent Review of Additional Tests or Records:  Audiological evaluation 01/02/2024    Tympanometry: Right ear: Type B- Normal external ear canal volume with no middle ear pressure peak or tympanic membrane compliance. Left ear: Type A- Normal external ear canal volume with normal middle ear pressure and tympanic membrane compliance.     Pure tone Audiometry: Right ear- Moderate to  severe essentially mixed hearing loss from 125 Hz - 8000 Hz, with air-bone gaps at 250 and 4000 Hz. Left ear-  Normal to moderately severe sensorineural hearing loss from 125 Hz - 8000 Hz.   Speech Audiometry: Right ear- Speech Reception Threshold (SRT) was obtained at 45 dBHL. Left ear-Speech Reception Threshold (SRT) was obtained at 50 dBHL.   Word Recognition Score Tested using NU-6 ordered by difficulty (recorded) Right ear: 12% was obtained at a presentation level of 90 dBHL with contralateral masking which is deemed as  poor. Left ear: 92% was obtained at a presentation level of 85 dBHL with contralateral masking which is deemed as  excellent.   The hearing test results were completed under headphones and re-checked with inserts and results are deemed to be of good to fair reliability. Test technique:  conventional     Of note- There is a hearing symmetry - form some pitches the right is worse and for others the opposite. PMH/Meds/All/SocHx/FamHx/ROS:   Past Medical History:  Diagnosis Date   ANEMIA-NOS 12/06/2008   BACK PAIN 02/20/2010   DIABETES MELLITUS, TYPE II 06/01/2008   GERD 06/01/2008   HYPERLIPIDEMIA 06/01/2008   HYPERTENSION 06/01/2008   Tremors of nervous system    pt states he has had tremors most of his life in his hands/hereditary     Past Surgical History:  Procedure Laterality Date   COLONOSCOPY     ELBOW BURSA SURGERY     right  elbow   ELBOW BURSA SURGERY Left 11/12/2018   Pt had left olecranon bursectomy   HAND SURGERY     2 surgeries on right hand   HAND SURGERY     left hand/removed a bone    Family History  Problem Relation Age of Onset   Diabetes Sister    Alzheimer's disease Sister    Diabetes Brother    Heart disease Mother    Sudden death Father        Suicide after his wife passed away    Colon cancer Neg Hx      Social Connections: Moderately Isolated (09/19/2023)   Social Connection and Isolation Panel    Frequency of Communication  with Friends and Family: Three times a week    Frequency of Social Gatherings with Friends and Family: Once a week    Attends Religious Services: Never    Database administrator or Organizations: No    Attends Engineer, structural: Never    Marital Status: Married      Current Outpatient Medications:    amLODipine  (NORVASC ) 5 MG tablet, TAKE 1 TABLET BY MOUTH EVERY DAY., Disp: 90 tablet, Rfl: 0   Ascorbic Acid (VITAMIN C) 1000 MG tablet, Take 1,000 mg by mouth daily., Disp: , Rfl:    atorvastatin  (LIPITOR) 40 MG tablet, TAKE 1 TABLET(40 MG) BY MOUTH DAILY, Disp: 90 tablet, Rfl: 3   Blood Glucose Monitoring Suppl DEVI, Use to check blood glucose TID, Disp: 1 each, Rfl: 0   Blood Glucose Monitoring Suppl DEVI, 1 each by Does not apply route in the morning, at noon, and at bedtime. May substitute to any manufacturer covered by patient's insurance., Disp: 1 each, Rfl: 0   cholecalciferol (VITAMIN D3) 25 MCG (1000 UNIT) tablet, Take 1,000 Units by mouth daily., Disp: , Rfl:    Lancets (FREESTYLE) lancets, Use as instructed, Disp: 100 each, Rfl: 12   lisinopril  (ZESTRIL ) 40 MG tablet, TAKE 1 TABLET(40 MG) BY MOUTH DAILY, Disp: 90 tablet, Rfl: 0   metFORMIN  (GLUCOPHAGE -XR) 500 MG 24 hr tablet, Take 1 tablet (500 mg total) by mouth daily with breakfast., Disp: 90 tablet, Rfl: 1   nicotine (NICODERM CQ - DOSED IN MG/24 HOURS) 21 mg/24hr patch, Place 21 mg onto the skin daily., Disp: , Rfl:    omeprazole  (PRILOSEC) 20 MG capsule, TAKE 1 CAPSULE(20 MG) BY MOUTH DAILY, Disp: 90 capsule, Rfl: 3   propranolol  ER (INDERAL  LA) 120 MG 24 hr capsule, Take 1 capsule (120 mg total) by mouth 2 (two) times daily., Disp: 180 capsule, Rfl: 3   Physical Exam:   BP (!) 146/80   Pulse (!) 56   SpO2 97%   Pertinent Findings  CN II-XII intact Left EAC cerumen impaction, once removed canal with exostosis, irritation along the posterior canal wall and TM unable to completely visualize entire tympanic  membrane but it appears intact with some sclerosis, right EAC clear, TM intact with sclerosis throughout [address]: equal [place] 512: AC > BC b/l  Anterior rhinoscopy: Septum midline; bilateral inferior turbinates with minimal hypertrophy No lesions of oral cavity/oropharynx; dentition within normal limits No obviously palpable neck masses/lymphadenopathy/thyromegaly No respiratory distress or stridor    Seprately Identifiable Procedures:  Procedure: bilateral ear microscopy and cerumen removal using microscope (CPT 30789) - Mod 25 Pre-procedure diagnosis: unilateral cerumen impaction left external auditory canal Post-procedure diagnosis: same Indication: bilateral cerumen impaction; given patient's otologic complaints and history as well as for improved and comprehensive examination  of external ear and tympanic membrane, bilateral otologic examination using microscope was performed and impacted cerumen removed  Procedure: Patient was placed semi-recumbent. Both ear canals were examined using the microscope with findings above. Cerumen removed from the left external auditory canal using suction and currette with improvement in EAC examination and patency. Left: EAC was patent. TM was intact . Middle ear was aerated. Drainage: none Right: EAC was patent. TM was intact . Middle ear was aerated . Drainage: none Patient tolerated the procedure well.   Impression & Plans:  Richard [name] is a 73 y.o. male with the following   Hearing loss-  73 year old gentleman presenting today with left-sided ear complaints.  He had significant cerumen impaction in the left ear.  His symptoms described sound like eustachian tube dysfunction.  Once I remove the cerumen he notes his symptoms did feel better although he had significant exostosis in the left canal as well as some erythema along the distal canal and TM, uncertain if this was secondary to the cerumen impaction.  Audiological evaluation shows right  sided mixed hearing loss with air-bone gaps as well as left-sided mixed hearing loss with air-bone gaps which is asymmetric from the right.  He also have right sided type B tympanometry. I do feel obtaining a CT temporal bone would be helpful in his workup.  He will initiate Flonase  for allergic rhinitis and to see if this helps with eustachian tube dysfunction.  Once the CT is available I will see him back in the office for review of results and further management.  The patient verbalized understanding and agreement to today's plan.   - f/u 2 weeks after CT    Thank you for allowing me the opportunity to care for your patient. Please do not hesitate to contact me should you have any other questions.  Sincerely, Chyrl Cohen PA-C [facility] Phone: [phone] Fax: [phone]  01/02/2024, 10:05 AM

## 2024-01-07 ENCOUNTER — Encounter: Payer: Self-pay | Admitting: Adult Health

## 2024-01-07 ENCOUNTER — Ambulatory Visit: Payer: Self-pay | Admitting: Adult Health

## 2024-01-07 ENCOUNTER — Ambulatory Visit (INDEPENDENT_AMBULATORY_CARE_PROVIDER_SITE_OTHER): Admitting: Adult Health

## 2024-01-07 VITALS — BP 138/80 | HR 58 | Temp 98.1°F | Ht 67.0 in | Wt 185.0 lb

## 2024-01-07 DIAGNOSIS — E119 Type 2 diabetes mellitus without complications: Secondary | ICD-10-CM | POA: Diagnosis not present

## 2024-01-07 DIAGNOSIS — Z7984 Long term (current) use of oral hypoglycemic drugs: Secondary | ICD-10-CM

## 2024-01-07 DIAGNOSIS — I10 Essential (primary) hypertension: Secondary | ICD-10-CM

## 2024-01-07 LAB — HEMOGLOBIN A1C: Hgb A1c MFr Bld: 5.8 % (ref 4.6–6.5)

## 2024-01-07 LAB — BASIC METABOLIC PANEL WITH GFR
BUN: 13 mg/dL (ref 6–23)
CO2: 26 meq/L (ref 19–32)
Calcium: 9.5 mg/dL (ref 8.4–10.5)
Chloride: 102 meq/L (ref 96–112)
Creatinine, Ser: 1.47 mg/dL (ref 0.40–1.50)
GFR: 48 mL/min — ABNORMAL LOW (ref >60.00)
Glucose, Bld: 106 mg/dL — ABNORMAL HIGH (ref 70–99)
Potassium: 4.4 meq/L (ref 3.5–5.1)
Sodium: 135 meq/L (ref 135–145)

## 2024-01-07 NOTE — Progress Notes (Signed)
 Subjective:    Patient ID: Richard [name], male    DOB: [birth year], 73 y.o.   MRN: [id number]  HPI  73 year old male who  has a past medical history of ANEMIA-NOS (12/06/2008), BACK PAIN (02/20/2010), DIABETES MELLITUS, TYPE II (06/01/2008), GERD (06/01/2008), HYPERLIPIDEMIA (06/01/2008), HYPERTENSION (06/01/2008), and Tremors of nervous system.  He presents to the office today for follow up regarding DM and HTN   Diabetes mellitus type 2- He was placed on Metformin  500 mg XR daily about three months ago. . We have tried to get him approved for Jardiance  and Farxiga  in the past for kidney protection but this was not covered by his insurance.  He has been checking his blood sugar at home with readings in the 120 range..  Lab Results  Component Value Date   HGBA1C 6.1 10/03/2023   HGBA1C 5.7 (A) 05/09/2023   HGBA1C 5.9 10/02/2022   Hypertension -well-controlled with lisinopril  40 mg daily and Norvasc  5 mg daily.  He denies episodes of dizziness, lightheadedness, chest pain, or shortness of breath BP Readings from Last 3 Encounters:  01/07/24 138/80  01/02/24 (!) 146/80  11/07/23 130/68    Review of Systems See HPI   Past Medical History:  Diagnosis Date   ANEMIA-NOS 12/06/2008   BACK PAIN 02/20/2010   DIABETES MELLITUS, TYPE II 06/01/2008   GERD 06/01/2008   HYPERLIPIDEMIA 06/01/2008   HYPERTENSION 06/01/2008   Tremors of nervous system    pt states he has had tremors most of his life in his hands/hereditary    Social History   Socioeconomic History   Marital status: Married    Spouse name: Not on file   Number of children: Not on file   Years of education: Not on file   Highest education level: Not on file  Occupational History   Not on file  Tobacco Use   Smoking status: Every Day    Current packs/day: 2.00    Average packs/day: 2.0 packs/day for 35.0 years (70.0 ttl pk-yrs)    Types: Cigarettes   Smokeless tobacco: Never   Tobacco comments:    down to 1.5 ppd X8 mos.   plans to quit on upcoming birthday  Vaping Use   Vaping status: Never Used  Substance and Sexual Activity   Alcohol use: Yes    Alcohol/week: 2.0 - 3.0 standard drinks of alcohol    Types: 2 - 3 Standard drinks or equivalent per week   Drug use: No   Sexual activity: Not on file  Other Topics Concern   Not on file  Social History Narrative   Not on file   Social Drivers of Health   Financial Resource Strain: Low Risk  (09/19/2023)   Overall Financial Resource Strain (CARDIA)    Difficulty of Paying Living Expenses: Not very hard  Food Insecurity: No Food Insecurity (09/19/2023)   Hunger Vital Sign    Worried About Running Out of Food in the Last Year: Never true    Ran Out of Food in the Last Year: Never true  Transportation Needs: No Transportation Needs (09/19/2023)   PRAPARE - Administrator, Civil Service (Medical): No    Lack of Transportation (Non-Medical): No  Physical Activity: Inactive (09/19/2023)   Exercise Vital Sign    Days of Exercise per Week: 0 days    Minutes of Exercise per Session: 0 min  Stress: No Stress Concern Present (09/19/2023)   [employer] of Occupational Health - Occupational Stress Questionnaire  Feeling of Stress : Not at all  Social Connections: Moderately Isolated (09/19/2023)   Social Connection and Isolation Panel    Frequency of Communication with Friends and Family: Three times a week    Frequency of Social Gatherings with Friends and Family: Once a week    Attends Religious Services: Never    Database administrator or Organizations: No    Attends Banker Meetings: Never    Marital Status: Married  Catering manager Violence: Not At Risk (09/19/2023)   Humiliation, Afraid, Rape, and Kick questionnaire    Fear of Current or Ex-Partner: No    Emotionally Abused: No    Physically Abused: No    Sexually Abused: No    Past Surgical History:  Procedure Laterality Date   COLONOSCOPY     ELBOW BURSA SURGERY      right elbow   ELBOW BURSA SURGERY Left 11/12/2018   Pt had left olecranon bursectomy   HAND SURGERY     2 surgeries on right hand   HAND SURGERY     left hand/removed a bone    Family History  Problem Relation Age of Onset   Diabetes Sister    Alzheimer's disease Sister    Diabetes Brother    Heart disease Mother    Sudden death Father        Suicide after his wife passed away    Colon cancer Neg Hx     No Known Allergies  Current Outpatient Medications on File Prior to Visit  Medication Sig Dispense Refill   amLODipine  (NORVASC ) 5 MG tablet TAKE 1 TABLET BY MOUTH EVERY DAY. 90 tablet 0   Ascorbic Acid (VITAMIN C) 1000 MG tablet Take 1,000 mg by mouth daily.     atorvastatin  (LIPITOR) 40 MG tablet TAKE 1 TABLET(40 MG) BY MOUTH DAILY 90 tablet 3   Blood Glucose Monitoring Suppl DEVI Use to check blood glucose TID 1 each 0   Blood Glucose Monitoring Suppl DEVI 1 each by Does not apply route in the morning, at noon, and at bedtime. May substitute to any manufacturer covered by patient's insurance. 1 each 0   cholecalciferol (VITAMIN D3) 25 MCG (1000 UNIT) tablet Take 1,000 Units by mouth daily.     fluticasone  (FLONASE ) 50 MCG/ACT nasal spray Place 2 sprays into both nostrils daily. 16 g 6   Lancets (FREESTYLE) lancets Use as instructed 100 each 12   lisinopril  (ZESTRIL ) 40 MG tablet TAKE 1 TABLET(40 MG) BY MOUTH DAILY 90 tablet 0   metFORMIN  (GLUCOPHAGE -XR) 500 MG 24 hr tablet Take 1 tablet (500 mg total) by mouth daily with breakfast. 90 tablet 1   nicotine (NICODERM CQ - DOSED IN MG/24 HOURS) 21 mg/24hr patch Place 21 mg onto the skin daily.     omeprazole  (PRILOSEC) 20 MG capsule TAKE 1 CAPSULE(20 MG) BY MOUTH DAILY 90 capsule 3   propranolol  ER (INDERAL  LA) 120 MG 24 hr capsule Take 1 capsule (120 mg total) by mouth 2 (two) times daily. 180 capsule 3   No current facility-administered medications on file prior to visit.    BP 138/80   Pulse (!) 58   Temp 98.1 F (36.7 C)  (Oral)   Ht 5' 7 (1.702 m)   Wt 185 lb (83.9 kg)   SpO2 99%   BMI 28.98 kg/m       Objective:   Physical Exam Vitals and nursing note reviewed.  Constitutional:      Appearance: Normal appearance.  Cardiovascular:     Rate and Rhythm: Normal rate and regular rhythm.     Pulses: Normal pulses.     Heart sounds: Normal heart sounds.  Pulmonary:     Effort: Pulmonary effort is normal.     Breath sounds: Normal breath sounds.  Skin:    General: Skin is warm and dry.  Neurological:     General: No focal deficit present.     Mental Status: He is alert and oriented to person, place, and time.  Psychiatric:        Mood and Affect: Mood normal.        Behavior: Behavior normal.        Thought Content: Thought content normal.        Judgment: Judgment normal.        Assessment & Plan:  1. Diabetes mellitus treated with oral medication (HCC) (Primary) - Consider increasing Metformin   - Hemoglobin A1c; Future - Basic Metabolic Panel; Future  2. Essential hypertension - Well controlled. No change in medication  - Hemoglobin A1c; Future - Basic Metabolic Panel; Future  Darleene Shape, NP

## 2024-01-08 ENCOUNTER — Ambulatory Visit (HOSPITAL_BASED_OUTPATIENT_CLINIC_OR_DEPARTMENT_OTHER)
Admission: RE | Admit: 2024-01-08 | Discharge: 2024-01-08 | Disposition: A | Discharge status: Home / Self Care | Source: Ambulatory Visit | Attending: Physician Assistant | Admitting: Physician Assistant

## 2024-01-08 ENCOUNTER — Telehealth (INDEPENDENT_AMBULATORY_CARE_PROVIDER_SITE_OTHER): Payer: Self-pay | Admitting: Physician Assistant

## 2024-01-08 DIAGNOSIS — H9190 Unspecified hearing loss, unspecified ear: Secondary | ICD-10-CM | POA: Diagnosis not present

## 2024-01-08 DIAGNOSIS — H906 Mixed conductive and sensorineural hearing loss, bilateral: Secondary | ICD-10-CM | POA: Insufficient documentation

## 2024-01-08 NOTE — Telephone Encounter (Signed)
 Attempted to call the patient to discuss CT results.  I did leave a voicemail.  I will attempt to reach him again.

## 2024-01-09 ENCOUNTER — Telehealth (INDEPENDENT_AMBULATORY_CARE_PROVIDER_SITE_OTHER): Payer: Self-pay | Admitting: Physician Assistant

## 2024-01-09 NOTE — Telephone Encounter (Signed)
 01/09/24 Patient returning provider call regarding results. Please return call at your earliest convenience.

## 2024-01-10 ENCOUNTER — Telehealth (INDEPENDENT_AMBULATORY_CARE_PROVIDER_SITE_OTHER): Payer: Self-pay | Admitting: Physician Assistant

## 2024-01-10 NOTE — Telephone Encounter (Signed)
 I attempted to call. LVM

## 2024-01-10 NOTE — Telephone Encounter (Signed)
 Patient called and was returning your call regarding the results of his CT Scan.  He can be reached at [phone].

## 2024-01-13 ENCOUNTER — Encounter: Payer: Self-pay | Admitting: Audiology

## 2024-01-14 ENCOUNTER — Telehealth: Payer: Self-pay | Admitting: Adult Health

## 2024-01-14 NOTE — Telephone Encounter (Signed)
 Noted

## 2024-01-14 NOTE — Telephone Encounter (Signed)
 Copied from CRM [id number]. Topic: Clinical - Lab/Test Results >> Jan 14, 2024  9:36 AM Larissa RAMAN wrote: Reason for CRM: Patient returning missed call for lab results, Relayed information per providers note, verbatim. Patient verbalized understanding, no additional questions.

## 2024-01-15 ENCOUNTER — Encounter (INDEPENDENT_AMBULATORY_CARE_PROVIDER_SITE_OTHER): Payer: Self-pay | Admitting: Physician Assistant

## 2024-01-15 ENCOUNTER — Ambulatory Visit (INDEPENDENT_AMBULATORY_CARE_PROVIDER_SITE_OTHER): Admitting: Physician Assistant

## 2024-01-15 VITALS — BP 130/80 | HR 56

## 2024-01-15 DIAGNOSIS — H6990 Unspecified Eustachian tube disorder, unspecified ear: Secondary | ICD-10-CM | POA: Diagnosis not present

## 2024-01-15 DIAGNOSIS — H6993 Unspecified Eustachian tube disorder, bilateral: Secondary | ICD-10-CM

## 2024-01-15 DIAGNOSIS — H65491 Other chronic nonsuppurative otitis media, right ear: Secondary | ICD-10-CM

## 2024-01-15 MED ORDER — LORATADINE 10 MG PO TABS
10.0000 mg | ORAL_TABLET | Freq: Every day | ORAL | 11 refills | Status: AC
Start: 2024-01-15 — End: ?

## 2024-01-15 NOTE — Progress Notes (Signed)
 Dear Dr. Merna, Here is my assessment for our mutual patient, Richard [name]. Thank you for allowing me the opportunity to care for your patient. Please do not hesitate to contact me should you have any other questions. Sincerely, Chyrl Cohen PA-C  Otolaryngology Clinic Note Referring provider: Dr. Merna HPI:  Richard [name] is a 73 y.o. male kindly referred by Dr. Merna   The patient is a 73 year old gentleman seen our office for follow-up evaluation of CT results.  The patient was last seen in the office on 01/02/2024 for left-sided clicking and popping.  Below is a recap of that encounter.  Update 01/15/2024.  Since his last office visit he notes he has been doing well.  He was identified that he had fluid within the right middle ear at his last visit, follow-up CT scan showed complete opacification of the right middle ear cavity and mastoids.  He notes that he has no significant symptoms on the right but continues to have clicking and popping on the left.  He denies any changes since his last office visit.  Again he did have tympanostomy tubes as a kid.  Is a smoker, no throat pain, no voice changes, no fever, weight loss or night sweats.  He notes that he did not have allergies when he was younger but recently has had some seasonal allergies, since his last office visit he been using Flonase .   Independent Review of Additional Tests or Records:  CT temporal bone on 01/08/2024    1. There is complete opacification of the right middle ear cavity and mastoids most likely reflecting chronic otomastoiditis. There is no bone destruction to suggest cholesteatoma 2. The left side is normal   PMH/Meds/All/SocHx/FamHx/ROS:   Past Medical History:  Diagnosis Date   ANEMIA-NOS 12/06/2008   BACK PAIN 02/20/2010   DIABETES MELLITUS, TYPE II 06/01/2008   GERD 06/01/2008   HYPERLIPIDEMIA 06/01/2008   HYPERTENSION 06/01/2008   Tremors of nervous system    pt states he has had tremors most of his  life in his hands/hereditary     Past Surgical History:  Procedure Laterality Date   COLONOSCOPY     ELBOW BURSA SURGERY     right elbow   ELBOW BURSA SURGERY Left 11/12/2018   Pt had left olecranon bursectomy   HAND SURGERY     2 surgeries on right hand   HAND SURGERY     left hand/removed a bone    Family History  Problem Relation Age of Onset   Diabetes Sister    Alzheimer's disease Sister    Diabetes Brother    Heart disease Mother    Sudden death Father        Suicide after his wife passed away    Colon cancer Neg Hx      Social Connections: Moderately Isolated (09/19/2023)   Social Connection and Isolation Panel    Frequency of Communication with Friends and Family: Three times a week    Frequency of Social Gatherings with Friends and Family: Once a week    Attends Religious Services: Never    Database administrator or Organizations: No    Attends Engineer, structural: Never    Marital Status: Married      Current Outpatient Medications:    amLODipine  (NORVASC ) 5 MG tablet, TAKE 1 TABLET BY MOUTH EVERY DAY., Disp: 90 tablet, Rfl: 0   Ascorbic Acid (VITAMIN C) 1000 MG tablet, Take 1,000 mg by mouth daily., Disp: , Rfl:  atorvastatin  (LIPITOR) 40 MG tablet, TAKE 1 TABLET(40 MG) BY MOUTH DAILY, Disp: 90 tablet, Rfl: 3   Blood Glucose Monitoring Suppl DEVI, Use to check blood glucose TID, Disp: 1 each, Rfl: 0   Blood Glucose Monitoring Suppl DEVI, 1 each by Does not apply route in the morning, at noon, and at bedtime. May substitute to any manufacturer covered by patient's insurance., Disp: 1 each, Rfl: 0   cholecalciferol (VITAMIN D3) 25 MCG (1000 UNIT) tablet, Take 1,000 Units by mouth daily., Disp: , Rfl:    fluticasone  (FLONASE ) 50 MCG/ACT nasal spray, Place 2 sprays into both nostrils daily., Disp: 16 g, Rfl: 6   Lancets (FREESTYLE) lancets, Use as instructed, Disp: 100 each, Rfl: 12   lisinopril  (ZESTRIL ) 40 MG tablet, TAKE 1 TABLET(40 MG) BY MOUTH  DAILY, Disp: 90 tablet, Rfl: 0   metFORMIN  (GLUCOPHAGE -XR) 500 MG 24 hr tablet, Take 1 tablet (500 mg total) by mouth daily with breakfast., Disp: 90 tablet, Rfl: 1   nicotine (NICODERM CQ - DOSED IN MG/24 HOURS) 21 mg/24hr patch, Place 21 mg onto the skin daily., Disp: , Rfl:    omeprazole  (PRILOSEC) 20 MG capsule, TAKE 1 CAPSULE(20 MG) BY MOUTH DAILY, Disp: 90 capsule, Rfl: 3   propranolol  ER (INDERAL  LA) 120 MG 24 hr capsule, Take 1 capsule (120 mg total) by mouth 2 (two) times daily., Disp: 180 capsule, Rfl: 3   Physical Exam:   BP 130/80   Pulse (!) 56   SpO2 95%   Pertinent Findings  CN II-XII intact Right TM with sclerosis and mucoid effusion, TM intact.  Left TM intact with well-pneumatized middle ear space  Anterior rhinoscopy: Septum midline; bilateral inferior turbinates with moderate hypertrophy No lesions of oral cavity/oropharynx; dentition within normal limits No obviously palpable neck masses/lymphadenopathy/thyromegaly No respiratory distress or stridor  Seprately Identifiable Procedures:  Procedure Note Pre-procedure diagnosis: right middle ear effusion Post-procedure diagnosis: Same Procedure: Transnasal Fiberoptic Laryngoscopy, CPT 31575 - Mod 25 Indication: see above Complications: None apparent EBL: 0 mL   The procedure was undertaken to further evaluate the patient's complaint of right middle ear effusion, with mirror exam inadequate for appropriate examination due to gag reflex and poor patient tolerance   Procedure:  Patient was identified as correct patient. Verbal consent was obtained. The nose was sprayed with oxymetazoline and 4% lidocaine. The The flexible laryngoscope was passed through the nose to view the nasal cavity, pharynx (oropharynx, hypopharynx) and larynx.  The larynx was examined at rest and during multiple phonatory tasks. Documentation was obtained and reviewed with patient. The scope was removed. The patient tolerated the procedure well.    Findings: The nasal cavity and nasopharynx did not reveal any masses or lesions, mucosa appeared to be without obvious lesions. The tongue base, pharyngeal walls, piriform sinuses, vallecula, epiglottis and postcricoid region are normal in appearance. The visualized portion of the subglottis and proximal trachea is widely patent. The vocal folds are mobile bilaterally.. There are no lesions on the free edge of the vocal folds nor elsewhere in the larynx worrisome for malignancy.  No lesions noted at the eustachian tube meatus bilateral                 Impression & Plans:  Elena Cothern is a 73 y.o. male with the following   Eustachian tube dysfunction-  73 year old male presenting today with symptoms most consistent with eustachian tube dysfunction.  He is more symptomatic on the left with clicking or popping, he has no  clicking or popping on the right but has have complete opacification of the right middle ear cavity and mastoids likely secondary to dysfunction of the right side eustachian tube.  Nasal endoscope shows no lesions.  No infectious etiology.  The patient has been started on Flonase  and antihistamines.  Given the CT findings I will discuss case with Dr. Tobie and provide the patient with recommendations moving forward.   - f/u phone call discussion with further recommendations once I speak with Dr. Tobie   Thank you for allowing me the opportunity to care for your patient. Please do not hesitate to contact me should you have any other questions.  Sincerely, Chyrl Cohen PA-C [facility] Phone: [phone] Fax: [phone]  01/15/2024, 9:22 AM

## 2024-01-20 ENCOUNTER — Telehealth (INDEPENDENT_AMBULATORY_CARE_PROVIDER_SITE_OTHER): Payer: Self-pay | Admitting: Otolaryngology

## 2024-01-20 NOTE — Telephone Encounter (Signed)
 LVM for patient to schedule appointment with Dr. Tobie w/in the next month for Mastoiditis per Chyrl DEL. - ok to double book.

## 2024-02-11 ENCOUNTER — Encounter (INDEPENDENT_AMBULATORY_CARE_PROVIDER_SITE_OTHER): Payer: Self-pay | Admitting: Otolaryngology

## 2024-02-11 ENCOUNTER — Ambulatory Visit (INDEPENDENT_AMBULATORY_CARE_PROVIDER_SITE_OTHER): Admitting: Otolaryngology

## 2024-02-11 VITALS — BP 126/81 | HR 67

## 2024-02-11 DIAGNOSIS — H6993 Unspecified Eustachian tube disorder, bilateral: Secondary | ICD-10-CM

## 2024-02-11 DIAGNOSIS — H90A31 Mixed conductive and sensorineural hearing loss, unilateral, right ear with restricted hearing on the contralateral side: Secondary | ICD-10-CM | POA: Diagnosis not present

## 2024-02-11 DIAGNOSIS — H65491 Other chronic nonsuppurative otitis media, right ear: Secondary | ICD-10-CM | POA: Diagnosis not present

## 2024-02-11 MED ORDER — PREDNISONE 20 MG PO TABS: 20.0000 mg | ORAL_TABLET | Freq: Every day | ORAL | 0 refills | Status: AC

## 2024-02-11 NOTE — Patient Instructions (Signed)
 Take Prednisone  by mouth (PO) 20mg  for 5 days (2 pills). Risks discussed Use flonase  two sprays each nostril twice per day

## 2024-02-11 NOTE — Progress Notes (Signed)
 Dear Dr. Merna, Here is my assessment for our mutual patient, Richard [name]. Thank you for allowing me the opportunity to care for your patient. Please do not hesitate to contact me should you have any other questions. Sincerely, Dr. Eldora Blanch  Otolaryngology Clinic Note Referring provider: Dr. Merna HPI:  Richard [name] is a 73 y.o. male kindly referred by Dr. Nafziger for evaluation of mastoiditis  Initial saw Chyrl Cohen x2 in July 2025 for left ear issues with clicking and popping without pain. Noted bilateral hearing loss. History of tympanostomy tubes. Hearing test was obtained which showed right type B tympanograms and left type A with moderate mixed hearing with left ear with sensorineural hearing loss. He then had a CT which is interpreted below. In follow up, he appeared to deny right sided symptoms. Given unilateral effusion, he also underwent endoscopy which did not show any ET masses.  --------------------------------------------------------- 02/11/2024 Referred to me for follow up.  Patient reports: he reports above history and reports that his left ear pops. Right does not pop but when he blows his nose, sometimes he notices that his right ear pops as well. No ear pain or pressure including post-auricularly. He does feel like his hearing has declined bilaterally, but right ear is worse. Chronic. Did have machinery noise exposure. No antecedent URI. He is using flonase  once a day and PO anthistamine.  Patient denies: ear pain, fullness, vertigo, drainage Patient also denies barotrauma, vestibular suppressant use, ototoxic medication use Prior ear surgery: tympanostomy tubes as a child - nothing as an adult  Personal or FHx of bleeding dz or anesthesia difficulty: no  AP/AC: no  Tobacco: yes (2 PPD)  PMHx: IPF, HTN, DM, Tremor  Independent Review of Additional Tests or Records:  Audio 12/2023:   CT Temporal bones independently interpreted 01/08/2024: right mastoid and  ME opacification with thin tegmen. No obvious significant erosion or changes suggestive of otomastoiditis. Left mastoid and ME well aerated; no ossicular chain erosion bilaterally  PMH/Meds/All/SocHx/FamHx/ROS:   Past Medical History:  Diagnosis Date   ANEMIA-NOS 12/06/2008   BACK PAIN 02/20/2010   DIABETES MELLITUS, TYPE II 06/01/2008   GERD 06/01/2008   HYPERLIPIDEMIA 06/01/2008   HYPERTENSION 06/01/2008   Tremors of nervous system    pt states he has had tremors most of his life in his hands/hereditary     Past Surgical History:  Procedure Laterality Date   COLONOSCOPY     ELBOW BURSA SURGERY     right elbow   ELBOW BURSA SURGERY Left 11/12/2018   Pt had left olecranon bursectomy   HAND SURGERY     2 surgeries on right hand   HAND SURGERY     left hand/removed a bone    Family History  Problem Relation Age of Onset   Diabetes Sister    Alzheimer's disease Sister    Diabetes Brother    Heart disease Mother    Sudden death Father        Suicide after his wife passed away    Colon cancer Neg Hx      Social Connections: Moderately Isolated (09/19/2023)   Social Connection and Isolation Panel    Frequency of Communication with Friends and Family: Three times a week    Frequency of Social Gatherings with Friends and Family: Once a week    Attends Religious Services: Never    Database administrator or Organizations: No    Attends Banker Meetings: Never    Marital Status:  Married      Current Outpatient Medications:    amLODipine  (NORVASC ) 5 MG tablet, TAKE 1 TABLET BY MOUTH EVERY DAY., Disp: 90 tablet, Rfl: 0   Ascorbic Acid (VITAMIN C) 1000 MG tablet, Take 1,000 mg by mouth daily., Disp: , Rfl:    atorvastatin  (LIPITOR) 40 MG tablet, TAKE 1 TABLET(40 MG) BY MOUTH DAILY, Disp: 90 tablet, Rfl: 3   Blood Glucose Monitoring Suppl DEVI, Use to check blood glucose TID, Disp: 1 each, Rfl: 0   Blood Glucose Monitoring Suppl DEVI, 1 each by Does not apply route in  the morning, at noon, and at bedtime. May substitute to any manufacturer covered by patient's insurance., Disp: 1 each, Rfl: 0   cholecalciferol (VITAMIN D3) 25 MCG (1000 UNIT) tablet, Take 1,000 Units by mouth daily., Disp: , Rfl:    fluticasone  (FLONASE ) 50 MCG/ACT nasal spray, Place 2 sprays into both nostrils daily., Disp: 16 g, Rfl: 6   Lancets (FREESTYLE) lancets, Use as instructed, Disp: 100 each, Rfl: 12   lisinopril  (ZESTRIL ) 40 MG tablet, TAKE 1 TABLET(40 MG) BY MOUTH DAILY, Disp: 90 tablet, Rfl: 0   loratadine  (CLARITIN ) 10 MG tablet, Take 1 tablet (10 mg total) by mouth daily., Disp: 30 tablet, Rfl: 11   metFORMIN  (GLUCOPHAGE -XR) 500 MG 24 hr tablet, Take 1 tablet (500 mg total) by mouth daily with breakfast., Disp: 90 tablet, Rfl: 1   nicotine (NICODERM CQ - DOSED IN MG/24 HOURS) 21 mg/24hr patch, Place 21 mg onto the skin daily., Disp: , Rfl:    omeprazole  (PRILOSEC) 20 MG capsule, TAKE 1 CAPSULE(20 MG) BY MOUTH DAILY, Disp: 90 capsule, Rfl: 3   predniSONE  (DELTASONE ) 20 MG tablet, Take 1 tablet (20 mg total) by mouth daily with breakfast for 5 days., Disp: 5 tablet, Rfl: 0   propranolol  ER (INDERAL  LA) 120 MG 24 hr capsule, Take 1 capsule (120 mg total) by mouth 2 (two) times daily., Disp: 180 capsule, Rfl: 3   Physical Exam:   BP 126/81 (BP Location: Left Arm, Patient Position: Sitting, Cuff Size: Normal)   Pulse 67   SpO2 98%   Salient findings:  CN II-XII intact Given history and complaints, ear microscopy was indicated and performed for evaluation with findings as below in physical exam section and in procedures;  Bilateral EAC clear and TM intact with well pneumatized middle ear spaces on left - on right posterior monomeric area with serous effusion Weber 512: right Rinne 512: AC > BC b/l  Anterior rhinoscopy: Septum intact; bilateral inferior turbinates without significant hypertrophy No lesions of oral cavity/oropharynx No obviously palpable neck  masses/lymphadenopathy/thyromegaly No respiratory distress or stridor  Seprately Identifiable Procedures:  Prior to initiating any procedures, risks/benefits/alternatives were explained to the patient and verbal consent obtained. Procedure: Bilateral ear microscopy using microscope (CPT [id number]) Pre-procedure diagnosis: right middle ear effusion Post-procedure diagnosis: same Indication: see above; given patient's otologic complaints and history, for improved and comprehensive examination of external ear and tympanic membrane, bilateral otologic examination using microscope was performed. Prior to proceeding, verbal consent was obtained after discussion of R/B/A  Procedure: Patient was placed semi-recumbent. Both ear canals were examined using the microscope with findings above. Patient tolerated the procedure well.   Impression & Plans:  Khylan Sawyer is a 73 y.o. male with:  1. Mixed conductive and sensorineural hearing loss of right ear with restricted hearing of left ear   2. Dysfunction of both eustachian tubes   3. Chronic middle ear effusion, right  Right ME effusion without other symptoms such as post-auricular pressure and pain. Maybe slight improvement on flonase ? We discussed options including tymp tube and would not recommend mastoidectomy currently.  Will continue flonase , do pred burst, and f/u in 8 weeks with audio; if effusion persists, can consider tymp tube  See below regarding exact medications prescribed this encounter including dosages and route: Meds ordered this encounter  Medications   predniSONE  (DELTASONE ) 20 MG tablet    Sig: Take 1 tablet (20 mg total) by mouth daily with breakfast for 5 days.    Dispense:  5 tablet    Refill:  0      Thank you for allowing me the opportunity to care for your patient. Please do not hesitate to contact me should you have any other questions.  Sincerely, Eldora Blanch, MD Otolaryngologist (ENT), [facility] Phone: [phone] Fax: [phone]  02/11/2024, 8:22 AM   MDM:  Level 4 - 99214 Complexity/Problems addressed: mod - chronic problems with exacerbation Data complexity: mod - independent interpretation of CT imaging - Morbidity: mod  - Prescription Drug prescribed or managed: y

## 2024-02-16 ENCOUNTER — Other Ambulatory Visit: Payer: Self-pay | Admitting: Adult Health

## 2024-02-16 DIAGNOSIS — I10 Essential (primary) hypertension: Secondary | ICD-10-CM

## 2024-03-25 ENCOUNTER — Encounter: Payer: Self-pay | Admitting: Adult Health

## 2024-04-02 ENCOUNTER — Other Ambulatory Visit: Payer: Self-pay | Admitting: Adult Health

## 2024-04-07 ENCOUNTER — Encounter (INDEPENDENT_AMBULATORY_CARE_PROVIDER_SITE_OTHER): Payer: Self-pay | Admitting: Otolaryngology

## 2024-04-07 ENCOUNTER — Ambulatory Visit (INDEPENDENT_AMBULATORY_CARE_PROVIDER_SITE_OTHER): Admitting: Otolaryngology

## 2024-04-07 ENCOUNTER — Ambulatory Visit (INDEPENDENT_AMBULATORY_CARE_PROVIDER_SITE_OTHER): Admitting: Audiology

## 2024-04-07 VITALS — BP 133/76 | HR 67 | Ht 67.0 in | Wt 194.0 lb

## 2024-04-07 DIAGNOSIS — H9042 Sensorineural hearing loss, unilateral, left ear, with unrestricted hearing on the contralateral side: Secondary | ICD-10-CM | POA: Diagnosis not present

## 2024-04-07 DIAGNOSIS — H9071 Mixed conductive and sensorineural hearing loss, unilateral, right ear, with unrestricted hearing on the contralateral side: Secondary | ICD-10-CM

## 2024-04-07 DIAGNOSIS — H6991 Unspecified Eustachian tube disorder, right ear: Secondary | ICD-10-CM | POA: Diagnosis not present

## 2024-04-07 DIAGNOSIS — H6990 Unspecified Eustachian tube disorder, unspecified ear: Secondary | ICD-10-CM

## 2024-04-07 DIAGNOSIS — H90A31 Mixed conductive and sensorineural hearing loss, unilateral, right ear with restricted hearing on the contralateral side: Secondary | ICD-10-CM

## 2024-04-07 DIAGNOSIS — H65491 Other chronic nonsuppurative otitis media, right ear: Secondary | ICD-10-CM

## 2024-04-07 MED ORDER — OFLOXACIN 0.3 % OT SOLN: 4.0000 [drp] | Freq: Two times a day (BID) | OTIC | 1 refills | Status: AC

## 2024-04-07 NOTE — Progress Notes (Signed)
 Dear Dr. Merna, Here is my assessment for our mutual patient, Richard [name]. Thank you for allowing me the opportunity to care for your patient. Please do not hesitate to contact me should you have any other questions. Sincerely, Dr. Eldora Blanch  Otolaryngology Clinic Note Referring provider: Dr. Merna HPI:  Richard [name] is a 73 y.o. male kindly referred by Dr. Nafziger for evaluation of mastoiditis  Initial saw Richard [name] x2 in July 2025 for left ear issues with clicking and popping without pain. Noted bilateral hearing loss. History of tympanostomy tubes. Hearing test was obtained which showed right type B tympanograms and left type A with moderate mixed hearing with left ear with sensorineural hearing loss. He then had a CT which is interpreted below. In follow up, he appeared to deny right sided symptoms. Given unilateral effusion, he also underwent endoscopy which did not show any ET masses.  --------------------------------------------------------- 02/11/2024 Referred to me for follow up.  Patient reports: he reports above history and reports that his left ear pops. Right does not pop but when he blows his nose, sometimes he notices that his right ear pops as well. No ear pain or pressure including post-auricularly. He does feel like his hearing has declined bilaterally, but right ear is worse. Chronic. Did have machinery noise exposure. No antecedent URI. He is using flonase  once a day and PO anthistamine.  Patient denies: ear pain, fullness, vertigo, drainage Patient also denies barotrauma, vestibular suppressant use, ototoxic medication use Prior ear surgery: tympanostomy tubes as a child - nothing as an adult  --------------------------------------------------------- 04/07/2024 Ear is about the same. Right ear pops occassionally; using flonase . 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 Continues to smoke. No drainage from ear.   Personal or FHx of bleeding dz  or anesthesia difficulty: no  AP/AC: no  Tobacco: yes (2 PPD)  PMHx: IPF, HTN, DM, Tremor  Independent Review of Additional Tests or Records:  Audio 12/2023:  04/2024 Audiogram was independently reviewed and interpreted by me and it reveals - essentially stable thresholds; B/A tymps   SNHL= Sensorineural hearing loss   CT Temporal bones independently interpreted 01/08/2024: right mastoid and ME opacification with thin tegmen. No obvious significant erosion or changes suggestive of otomastoiditis. Left mastoid and ME well aerated; no ossicular chain erosion bilaterally  PMH/Meds/All/SocHx/FamHx/ROS:   Past Medical History:  Diagnosis Date   ANEMIA-NOS 12/06/2008   BACK PAIN 02/20/2010   DIABETES MELLITUS, TYPE II 06/01/2008   GERD 06/01/2008   HYPERLIPIDEMIA 06/01/2008   HYPERTENSION 06/01/2008   Tremors of nervous system    pt states he has had tremors most of his life in his hands/hereditary     Past Surgical History:  Procedure Laterality Date   COLONOSCOPY     ELBOW BURSA SURGERY     right elbow   ELBOW BURSA SURGERY Left 11/12/2018   Pt had left olecranon bursectomy   HAND SURGERY     2 surgeries on right hand   HAND SURGERY     left hand/removed a bone    Family History  Problem Relation Age of Onset   Diabetes Sister    Alzheimer's disease Sister    Diabetes Brother    Heart disease Mother    Sudden death Father        Suicide after his wife passed away    Colon cancer Neg Hx      Social Connections: Moderately Isolated (09/19/2023)   Social Connection and Isolation Panel  Frequency of Communication with Friends and Family: Three times a week    Frequency of Social Gatherings with Friends and Family: Once a week    Attends Religious Services: Never    Database administrator or Organizations: No    Attends Engineer, structural: Never    Marital Status: Married      Current Outpatient Medications:    amLODipine  (NORVASC ) 5 MG tablet, TAKE 1  TABLET BY MOUTH EVERY DAY., Disp: 90 tablet, Rfl: 0   Ascorbic Acid (VITAMIN C) 1000 MG tablet, Take 1,000 mg by mouth daily., Disp: , Rfl:    atorvastatin  (LIPITOR) 40 MG tablet, TAKE 1 TABLET(40 MG) BY MOUTH DAILY, Disp: 90 tablet, Rfl: 3   Blood Glucose Monitoring Suppl DEVI, Use to check blood glucose TID, Disp: 1 each, Rfl: 0   Blood Glucose Monitoring Suppl DEVI, 1 each by Does not apply route in the morning, at noon, and at bedtime. May substitute to any manufacturer covered by patient's insurance., Disp: 1 each, Rfl: 0   cholecalciferol (VITAMIN D3) 25 MCG (1000 UNIT) tablet, Take 1,000 Units by mouth daily., Disp: , Rfl:    fluticasone  (FLONASE ) 50 MCG/ACT nasal spray, Place 2 sprays into both nostrils daily., Disp: 16 g, Rfl: 6   Lancets (FREESTYLE) lancets, Use as instructed, Disp: 100 each, Rfl: 12   lisinopril  (ZESTRIL ) 40 MG tablet, TAKE 1 TABLET(40 MG) BY MOUTH DAILY, Disp: 90 tablet, Rfl: 0   loratadine  (CLARITIN ) 10 MG tablet, Take 1 tablet (10 mg total) by mouth daily., Disp: 30 tablet, Rfl: 11   metFORMIN  (GLUCOPHAGE -XR) 500 MG 24 hr tablet, TAKE 1 TABLET(500 MG) BY MOUTH DAILY WITH BREAKFAST, Disp: 90 tablet, Rfl: 1   nicotine (NICODERM CQ - DOSED IN MG/24 HOURS) 21 mg/24hr patch, Place 21 mg onto the skin daily., Disp: , Rfl:    ofloxacin (FLOXIN) 0.3 % OTIC solution, Place 4 drops into the right ear 2 (two) times daily for 5 days., Disp: 5 mL, Rfl: 1   omeprazole  (PRILOSEC) 20 MG capsule, TAKE 1 CAPSULE(20 MG) BY MOUTH DAILY, Disp: 90 capsule, Rfl: 3   propranolol  ER (INDERAL  LA) 120 MG 24 hr capsule, Take 1 capsule (120 mg total) by mouth 2 (two) times daily., Disp: 180 capsule, Rfl: 3   Physical Exam:   BP 133/76 (BP Location: Right Arm, Patient Position: Sitting, Cuff Size: Large)   Pulse 67   Ht 5' 7 (1.702 m)   Wt 194 lb (88 kg)   SpO2 97%   BMI 30.38 kg/m   Salient findings:  CN II-XII intact Given history and complaints, ear microscopy was indicated and  performed for evaluation with findings as below in physical exam section and in procedures;  Bilateral EAC clear and TM intact with well pneumatized middle ear spaces on left - on right posterior monomeric area with serous effusion.  After R/B/A, right PE tube placed and serous effusion evacuated. See note below Weber 512: right Rinne 512: AC > BC b/l  Anterior rhinoscopy: Septum intact; bilateral inferior turbinates without significant hypertrophy No respiratory distress or stridor  Seprately Identifiable Procedures:  Prior to initiating any procedures, risks/benefits/alternatives were explained to the patient and verbal or written consent obtained.  Procedure: Bilateral ear microscopy with right myringotomy and tympanostomy tube placement (CPT [phone]) - RT Pre-procedure diagnosis:  Right chronic serous otitis media Right mixed hearing loss Right eustachian tube dysfunction Post-procedure diagnosis: same Indication: Patient is a 73 y.o. male with pre-procedure diagnoses above.  We discussed options: 1. Observation 2. Nasal sprays 3. Tympanostomy tube. Risks discussed, patient opted for tympanostomy tube placement.  Written consent was obtained.  We had a long discussion about benefits and risks of Tympanostomy tube placement including pain, bleeding, infection, early or late extrusion, TM perforation, otorrhea, injury to middle or external ear structures, nature of tympanostomy tubes, cholesteatoma, hearing loss, among others. Consent was obtained prior to proceeding.  Findings: Right Ear: serous effusion, other findings as above Successful tympanostomy tube placement on right  Complications: None apparent  Procedure details: Patient was placed semi recumbent on the exam chair. The right ear was addressed first with binocular microscopy and any cerumen was cleaned from the ear canal. The tympanic membrane was visualized, with findings as above. A focal spot over the inferior TM was  anesthetized with topical phenol. A myringotomy incision wastthen made radially. Serous effusion was encountered. The middle ear cleft was suctioned. The PE tube was placed and ciprodex drops were applied. A cotton ball was placed at the meatus. The left was examined then with findings above  Patient tolerated the procedure well    Impression & Plans:  Henley Boettner is a 73 y.o. male with:  1. Mixed conductive and sensorineural hearing loss of right ear with restricted hearing of left ear   2. Chronic middle ear effusion, right   3. Dysfunction of Eustachian tube, unspecified laterality    Right ME effusion without other symptoms such as post-auricular pressure and pain. No improvement after medical management We discussed options including tymp tube which he opted for  Right tymp tube done F/u 4 months with HT Ofloxacin ear drops AD x5d  See below regarding exact medications prescribed this encounter including dosages and route: Meds ordered this encounter  Medications   ofloxacin (FLOXIN) 0.3 % OTIC solution    Sig: Place 4 drops into the right ear 2 (two) times daily for 5 days.    Dispense:  5 mL    Refill:  1      Thank you for allowing me the opportunity to care for your patient. Please do not hesitate to contact me should you have any other questions.  Sincerely, Eldora Blanch, MD Otolaryngologist (ENT), [facility] Phone: [phone] Fax: [phone]  04/07/2024, 12:36 PM   MDM:  Level 4 - 99214 Complexity/Problems addressed: mod - chronic problems with exacerbation Data complexity: low - Morbidity: mod  - Prescription Drug prescribed or managed: y

## 2024-04-07 NOTE — Progress Notes (Signed)
  [address] [place], [place] [address] [phone]  Audiological Evaluation    Name: Richard [name]     DOB:   11-06-1950      MRN:   [id number]                                                                                     Service Date: 04/07/2024     Accompanied by: unaccompanied   Patient comes today after Dr. Tobie, ENT sent a referral for a hearing evaluation due to concerns with hearing loss.   Symptoms Yes Details  Hearing loss  [x]  01-02-24: Right ear- Moderate to severe essentially mixed hearing loss from 125 Hz - 8000 Hz, with air-bone gaps at 250 and 4000 Hz. Left ear-  Normal to moderately severe sensorineural hearing loss from 125 Hz - 8000 Hz.  Tinnitus  []    Ear pain/ infections/pressure  []    Balance problems  []    Noise exposure history  []    Previous ear surgeries  []    Family history of hearing loss  []    Amplification  []    Other  []      Otoscopy: Right ear: Clear external ear canal and notable landmarks visualized on the tympanic membrane. Left ear:  Non-occluding cerumen, able to visualize some tympanic membrane landmarks.  Tympanometry: Right ear: Type B- Normal external ear canal volume with no middle ear pressure peak or tympanic membrane compliance. Left ear: Type A- Normal external ear canal volume with normal middle ear pressure and tympanic membrane compliance.  Pure tone Audiometry: Right ear- Moderate to profound  mixed hearing loss from 250 Hz - 8000 Hz. Left ear-  Normal to severe sensorineural hearing loss from 250 Hz - 8000 Hz.  Speech Audiometry: Right ear- Speech Reception Threshold (SRT) was obtained at 55 dBHL, with contralateral masking . Left ear-Speech Reception Threshold (SRT) was obtained at 45 dBHL.   Word Recognition Score Tested using NU-6 (recorded) Right ear: 80% was obtained at a presentation level of 100 dBHL with contralateral masking which is deemed as  good . Left ear: 88% was obtained at a  presentation level of 90 dBHL with contralateral masking which is deemed as  good .   The hearing test results were completed under headphones and results are deemed to be of good reliability. Test technique:  conventional    Impression: Right ar conductive component seems worse today than when he was seen on 01-02-2024.  Recommendations: Follow up with ENT as scheduled for today., Repeat audiogram after medical care.   Denitra Donaghey MARIE LEROUX-MARTINEZ, AUD

## 2024-05-13 ENCOUNTER — Ambulatory Visit

## 2024-05-13 DIAGNOSIS — Z23 Encounter for immunization: Secondary | ICD-10-CM | POA: Diagnosis not present

## 2024-07-14 ENCOUNTER — Ambulatory Visit: Admitting: Adult Health

## 2024-07-14 ENCOUNTER — Encounter: Payer: Self-pay | Admitting: Adult Health

## 2024-07-14 VITALS — BP 120/88 | HR 53 | Temp 98.0°F | Ht 67.0 in | Wt 185.0 lb

## 2024-07-14 DIAGNOSIS — F321 Major depressive disorder, single episode, moderate: Secondary | ICD-10-CM | POA: Diagnosis not present

## 2024-07-14 DIAGNOSIS — Z7984 Long term (current) use of oral hypoglycemic drugs: Secondary | ICD-10-CM | POA: Diagnosis not present

## 2024-07-14 DIAGNOSIS — I10 Essential (primary) hypertension: Secondary | ICD-10-CM

## 2024-07-14 DIAGNOSIS — E119 Type 2 diabetes mellitus without complications: Secondary | ICD-10-CM

## 2024-07-14 LAB — POCT GLYCOSYLATED HEMOGLOBIN (HGB A1C): Hemoglobin A1C: 5.5 % (ref 4.0–5.6)

## 2024-07-14 MED ORDER — ESCITALOPRAM OXALATE 5 MG PO TABS: 5.0000 mg | ORAL_TABLET | Freq: Every day | ORAL | 0 refills | Status: AC

## 2024-07-14 MED ORDER — LISINOPRIL 40 MG PO TABS: 40.0000 mg | ORAL_TABLET | Freq: Every day | ORAL | 1 refills | Status: AC

## 2024-07-14 NOTE — Progress Notes (Signed)
 "  Subjective:    Patient ID: Richard [name], male    DOB: May 05, 1951, 74 y.o.   MRN: [id number]  HPI He is a pleasant 74 year old male who  has a past medical history of ANEMIA-NOS (12/06/2008), BACK PAIN (02/20/2010), DIABETES MELLITUS, TYPE II (06/01/2008), GERD (06/01/2008), HYPERLIPIDEMIA (06/01/2008), HYPERTENSION (06/01/2008), and Tremors of nervous system.  He presents to the office today for follow up regarding DM and HTN   Diabetes mellitus type 2- He was placed on Metformin  500 mg XR daily about three months ago. We have tried to get him approved for Jardiance  and Farxiga  in the past for kidney protection but this was not covered by his insurance.  He has been checking his blood sugar at home with readings in the 120 range..  Lab Results  Component Value Date   HGBA1C 5.8 01/07/2024   HGBA1C 6.1 10/03/2023   HGBA1C 5.7 (A) 05/09/2023   Hypertension -well-controlled with lisinopril  40 mg daily and Norvasc  5 mg daily.  He denies episodes of dizziness, lightheadedness, chest pain, or shortness of breath BP Readings from Last 3 Encounters:  07/14/24 120/88  04/07/24 133/76  02/11/24 126/81   Depression - he reports that the day before Thanksgiving his wife went in for an endoscopy and never woke up after the procedure. He has been dealing with her death the best that he can but does feel depressed and wants to try something low dose for the depression.   Review of Systems See HPI   Past Medical History:  Diagnosis Date   ANEMIA-NOS 12/06/2008   BACK PAIN 02/20/2010   DIABETES MELLITUS, TYPE II 06/01/2008   GERD 06/01/2008   HYPERLIPIDEMIA 06/01/2008   HYPERTENSION 06/01/2008   Tremors of nervous system    pt states he has had tremors most of his life in his hands/hereditary    Social History   Socioeconomic History   Marital status: Married    Spouse name: Not on file   Number of children: Not on file   Years of education: Not on file   Highest education level: Not on file   Occupational History   Not on file  Tobacco Use   Smoking status: Every Day    Current packs/day: 2.00    Average packs/day: 2.0 packs/day for 35.0 years (70.0 ttl pk-yrs)    Types: Cigarettes   Smokeless tobacco: Never   Tobacco comments:    down to 1.5 ppd X8 mos.  plans to quit on upcoming birthday  Vaping Use   Vaping status: Never Used  Substance and Sexual Activity   Alcohol use: Yes    Alcohol/week: 2.0 - 3.0 standard drinks of alcohol    Types: 2 - 3 Standard drinks or equivalent per week   Drug use: No   Sexual activity: Not on file  Other Topics Concern   Not on file  Social History Narrative   Not on file   Social Drivers of Health   Tobacco Use: High Risk (07/14/2024)   Patient History    Smoking Tobacco Use: Every Day    Smokeless Tobacco Use: Never    Passive Exposure: Not on file  Financial Resource Strain: Low Risk (09/19/2023)   Overall Financial Resource Strain (CARDIA)    Difficulty of Paying Living Expenses: Not very hard  Food Insecurity: No Food Insecurity (09/19/2023)   Hunger Vital Sign    Worried About Running Out of Food in the Last Year: Never true    Ran Out  of Food in the Last Year: Never true  Transportation Needs: No Transportation Needs (09/19/2023)   PRAPARE - Administrator, Civil Service (Medical): No    Lack of Transportation (Non-Medical): No  Physical Activity: Inactive (09/19/2023)   Exercise Vital Sign    Days of Exercise per Week: 0 days    Minutes of Exercise per Session: 0 min  Stress: No Stress Concern Present (09/19/2023)   [employer] of Occupational Health - Occupational Stress Questionnaire    Feeling of Stress : Not at all  Social Connections: Moderately Isolated (09/19/2023)   Social Connection and Isolation Panel    Frequency of Communication with Friends and Family: Three times a week    Frequency of Social Gatherings with Friends and Family: Once a week    Attends Religious Services: Never    Automotive Engineer or Organizations: No    Attends Banker Meetings: Never    Marital Status: Married  Catering Manager Violence: Not At Risk (09/19/2023)   Humiliation, Afraid, Rape, and Kick questionnaire    Fear of Current or Ex-Partner: No    Emotionally Abused: No    Physically Abused: No    Sexually Abused: No  Depression (PHQ2-9): Low Risk (09/19/2023)   Depression (PHQ2-9)    PHQ-2 Score: 0  Alcohol Screen: Low Risk (09/19/2023)   Alcohol Screen    Last Alcohol Screening Score (AUDIT): 6  Housing: Low Risk (09/19/2023)   Housing Stability Vital Sign    Unable to Pay for Housing in the Last Year: No    Number of Times Moved in the Last Year: 0    Homeless in the Last Year: No  Utilities: Not At Risk (09/19/2023)   AHC Utilities    Threatened with loss of utilities: No  Health Literacy: Adequate Health Literacy (09/19/2023)   B1300 Health Literacy    Frequency of need for help with medical instructions: Never    Past Surgical History:  Procedure Laterality Date   COLONOSCOPY     ELBOW BURSA SURGERY     right elbow   ELBOW BURSA SURGERY Left 11/12/2018   Pt had left olecranon bursectomy   HAND SURGERY     2 surgeries on right hand   HAND SURGERY     left hand/removed a bone    Family History  Problem Relation Age of Onset   Diabetes Sister    Alzheimer's disease Sister    Diabetes Brother    Heart disease Mother    Sudden death Father        Suicide after his wife passed away    Colon cancer Neg Hx     Allergies[1]  Medications Ordered Prior to Encounter[2]  BP 120/88   Pulse (!) 53   Temp 98 F (36.7 C) (Oral)   Ht 5' 7 (1.702 m)   Wt 185 lb (83.9 kg)   SpO2 98%   BMI 28.98 kg/m       Objective:   Physical Exam Vitals and nursing note reviewed.  Constitutional:      Appearance: Normal appearance.  Cardiovascular:     Rate and Rhythm: Normal rate and regular rhythm.     Pulses: Normal pulses.     Heart sounds: Normal heart sounds.   Pulmonary:     Effort: Pulmonary effort is normal.     Breath sounds: Normal breath sounds.  Skin:    General: Skin is warm and dry.  Neurological:  General: No focal deficit present.     Mental Status: He is alert and oriented to person, place, and time.  Psychiatric:        Mood and Affect: Mood normal.        Behavior: Behavior normal.        Thought Content: Thought content normal.        Judgment: Judgment normal.        Assessment & Plan:   1. Diabetes mellitus treated with oral medication (HCC) (Primary)  - POC HgB A1c- 5.5.  - Continue with Metformin    2. Essential hypertension - Well controlled. No change in medication - lisinopril  (ZESTRIL ) 40 MG tablet; Take 1 tablet (40 mg total) by mouth daily.  Dispense: 90 tablet; Refill: 1  3. Depression, major, single episode, moderate (HCC) -  Condolences given. Will start on Lexapro  5 mg daily  - Follow up in 30 days or sooner if needed  - escitalopram  (LEXAPRO ) 5 MG tablet; Take 1 tablet (5 mg total) by mouth daily.  Dispense: 30 tablet; Refill: 0   Darleene Shape, NP     [1] No Known Allergies [2]  Current Outpatient Medications on File Prior to Visit  Medication Sig Dispense Refill   amLODipine  (NORVASC ) 5 MG tablet TAKE 1 TABLET BY MOUTH EVERY DAY. 90 tablet 0   Ascorbic Acid (VITAMIN C) 1000 MG tablet Take 1,000 mg by mouth daily.     atorvastatin  (LIPITOR) 40 MG tablet TAKE 1 TABLET(40 MG) BY MOUTH DAILY 90 tablet 3   Blood Glucose Monitoring Suppl DEVI Use to check blood glucose TID 1 each 0   Blood Glucose Monitoring Suppl DEVI 1 each by Does not apply route in the morning, at noon, and at bedtime. May substitute to any manufacturer covered by patient's insurance. 1 each 0   cholecalciferol (VITAMIN D3) 25 MCG (1000 UNIT) tablet Take 1,000 Units by mouth daily.     fluticasone  (FLONASE ) 50 MCG/ACT nasal spray Place 2 sprays into both nostrils daily. 16 g 6   Lancets (FREESTYLE) lancets Use as instructed  100 each 12   lisinopril  (ZESTRIL ) 40 MG tablet TAKE 1 TABLET(40 MG) BY MOUTH DAILY 90 tablet 0   loratadine  (CLARITIN ) 10 MG tablet Take 1 tablet (10 mg total) by mouth daily. 30 tablet 11   metFORMIN  (GLUCOPHAGE -XR) 500 MG 24 hr tablet TAKE 1 TABLET(500 MG) BY MOUTH DAILY WITH BREAKFAST 90 tablet 1   nicotine (NICODERM CQ - DOSED IN MG/24 HOURS) 21 mg/24hr patch Place 21 mg onto the skin daily.     omeprazole  (PRILOSEC) 20 MG capsule TAKE 1 CAPSULE(20 MG) BY MOUTH DAILY 90 capsule 3   propranolol  ER (INDERAL  LA) 120 MG 24 hr capsule Take 1 capsule (120 mg total) by mouth 2 (two) times daily. 180 capsule 3   No current facility-administered medications on file prior to visit.   "

## 2024-08-13 ENCOUNTER — Ambulatory Visit (INDEPENDENT_AMBULATORY_CARE_PROVIDER_SITE_OTHER): Admitting: Audiology

## 2024-08-13 ENCOUNTER — Ambulatory Visit (INDEPENDENT_AMBULATORY_CARE_PROVIDER_SITE_OTHER): Admitting: Otolaryngology

## 2024-10-07 ENCOUNTER — Encounter: Admitting: Adult Health
